# Patient Record
Sex: Male | Born: 1975 | Race: White | Hispanic: No | Marital: Married | Smoking: Current every day smoker
Health system: Southern US, Community
[De-identification: ages and names within clinical notes are randomized; demographics above are authoritative.]

## PROBLEM LIST (undated history)

## (undated) DIAGNOSIS — I10 Essential (primary) hypertension: Secondary | ICD-10-CM

## (undated) DIAGNOSIS — D751 Secondary polycythemia: Secondary | ICD-10-CM

## (undated) HISTORY — DX: Secondary polycythemia: D75.1

## (undated) SURGERY — Surgical Case
Anesthesia: *Unknown

---

## 2001-07-26 ENCOUNTER — Emergency Department (HOSPITAL_COMMUNITY): Admission: EM | Admit: 2001-07-26 | Discharge: 2001-07-26 | Payer: Self-pay | Admitting: Emergency Medicine

## 2001-07-26 ENCOUNTER — Encounter: Payer: Self-pay | Admitting: Emergency Medicine

## 2001-07-28 ENCOUNTER — Ambulatory Visit (HOSPITAL_BASED_OUTPATIENT_CLINIC_OR_DEPARTMENT_OTHER): Admission: RE | Admit: 2001-07-28 | Discharge: 2001-07-28 | Payer: Self-pay | Admitting: Orthopedic Surgery

## 2001-10-24 ENCOUNTER — Ambulatory Visit (HOSPITAL_BASED_OUTPATIENT_CLINIC_OR_DEPARTMENT_OTHER): Admission: RE | Admit: 2001-10-24 | Discharge: 2001-10-24 | Payer: Self-pay | Admitting: Orthopedic Surgery

## 2007-08-11 ENCOUNTER — Emergency Department (HOSPITAL_COMMUNITY): Admission: EM | Admit: 2007-08-11 | Discharge: 2007-08-11 | Payer: Self-pay | Admitting: Emergency Medicine

## 2010-10-09 NOTE — Op Note (Signed)
[place]. [facility]  Patient:    Corey Robinson, Corey Robinson Visit Number: [id number] MRN: [id number]          Service Type: DSU Location: [facility] Attending Physician:  Ronne Binning Dictated by:   Nicki Reaper, M.D. Proc. Date: 10/24/01 Admit Date:  10/24/2001   CC:         (2)   Operative Report  PREOPERATIVE DIAGNOSIS: Status post intramedullary rod of right middle finger for metacarpal fracture.  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  OPERATION/PROCEDURE: Removal of intramedullary rod and repair with transfer of partial rupture to range finger extensor tendon, right hand.  SURGEON: Nicki Reaper, M.D.  ASSISTANT: Joaquin Courts, R.N.  ANESTHESIA: Forearm-based IV regional.  HISTORY: The patient is a 35 year old male who suffered a fracture of his third metacarpal, right hand.  He underwent IM rod fixation.  He has had healing of the fracture but discomfort at the dorsal aspect of his hand, and is admitted now for removal of the IM rod.  PROCEDURE: The patient was brought to the operating room, where a forearm-based IV regional anesthetic was carried out without difficulty.  He was prepped and draped using Betadine scrubbing solution with the right arm free.  A longitudinal incision was made over the base of the third metacarpal and carried down through subcutaneous tissue.  Bleeders were electrocauterized.  The IM rod was immediately apparent.  This was removed without difficulty.  The fracture was stable.  Significant serosanguineous fluid was present within the extensor sheath and it was decided to explore the extensor tendon.  A slip of the middle finger extensor tendon was was intact, allowing extension of the middle finger; however, a second portion was found to be ruptured.  It was decided to repair this to the  ring finger to add significant substance and strength to the remaining tendon.  This was sutured into position with figure-of-eight 4-0 [place] sutures in that the proximal end was not Corey Robinson to be discovered.  This was done with the finger fully extended to both middle and ring.  The two fingers lie in equal position following the repair.  The wound was irrigated.  The skin was closed with interrupted 5-0 nylon sutures.  A sterile compressive dressing and splint were applied.  The patient tolerated the procedure well and was taken to the recovery room for observation in satisfactory condition.  The family and patient were notified of the repair to the tendon.  He will be discharged home to return to the [facility] [place] in one week, on Vicodin and Keflex.Dictated by:   Nicki Reaper, M.D. Attending Physician:  Ronne Binning DD:  10/24/01 TD:  10/25/01 Job: [id number]

## 2010-10-09 NOTE — Op Note (Signed)
[place]  Patient:    Corey Robinson, Corey Robinson Visit Number: [id number] MRN: [id number]          Service Type: DSU Location: [facility] Attending Physician:  Ronne Binning Dictated by:   Nicki Reaper, M.D. Proc. Date: 07/28/01 Admit Date:  07/28/2001                             Operative Report  PREOPERATIVE DIAGNOSIS:  Fractured third metacarpal, right hand.  POSTOPERATIVE DIAGNOSIS:  Fractured third metacarpal, right hand.  OPERATION:  Placement of Innovasive design IM rod, right middle finger metacarpal.  SURGEON:   Nicki Reaper, M.D.  ASSISTANT:  Joaquin Courts, R.N.  ANESTHESIA:  General.  DATE OF OPERATION:  July 28, 2001  HISTORY:  The patient is a 35 year old male who suffered a fracture of his third metacarpal, right hand.  This is a short oblique with complete displacement.  DESCRIPTION OF PROCEDURE:  The patient was brought to the operating room where a general anesthetic was carried out without difficulty.  He was prepped and draped using Betadine scrub and solution with the right arm free after a general anesthetic was given.  The position of the base of the third metacarpal was identified with a needle using the OEC.  An incision was then made.  An Innovasive IM rod was then inserted into the base of the third metacarpal after dissecting down to the metacarpal base to protect the extensor tendons.  The trocar was then used to introduce the IM rod.  This was then passed into the proximal fracture fragment.  With a moderate amount of difficulty, this was passed distally.  The canal was extremely tight, causing moderate difficulty in aligning the fracture fragment and passing the rod into the distal portion.  This was finally accomplished with the fracture in excellent position - AP, lateral, and oblique x-rays.  This was fully stable. The rod was then bent and cut beneath the skin, taking care to protect the extensor tendons.  The  wound was irrigated.  The skin was closed with interrupted 5-0 nylon sutures.  A sterile compressive dressing and splint were applied.  The patient tolerated the procedure well and was taken to the recovery room for observation in satisfactory condition.  DISPOSITION:  He is discharged home to return to [facility] [place] in one week on [place] NX and Keflex. Dictated by:   Nicki Reaper, M.D. Attending Physician:  Ronne Binning DD:  07/28/01 TD:  07/29/01 Job: [id number]

## 2011-05-25 ENCOUNTER — Ambulatory Visit (INDEPENDENT_AMBULATORY_CARE_PROVIDER_SITE_OTHER): Payer: BC Managed Care – PPO

## 2011-05-25 DIAGNOSIS — J019 Acute sinusitis, unspecified: Secondary | ICD-10-CM

## 2011-05-25 DIAGNOSIS — R059 Cough, unspecified: Secondary | ICD-10-CM

## 2011-05-25 DIAGNOSIS — R05 Cough: Secondary | ICD-10-CM

## 2012-06-15 ENCOUNTER — Telehealth: Payer: Self-pay | Admitting: Oncology

## 2012-06-15 NOTE — Telephone Encounter (Signed)
S/W pt in re NP appt 2/13 @ 10:30 w/Dr. Gaylyn Rong Referring Dr. Modesto Charon Dx-Polycythemia Welcome packet mailed.

## 2012-06-15 NOTE — Telephone Encounter (Signed)
C/D 06/16/12 for appt.07/06/12

## 2012-06-20 ENCOUNTER — Encounter: Payer: Self-pay | Admitting: Oncology

## 2012-06-20 ENCOUNTER — Other Ambulatory Visit: Payer: Self-pay | Admitting: Oncology

## 2012-06-20 DIAGNOSIS — D751 Secondary polycythemia: Secondary | ICD-10-CM

## 2012-07-06 ENCOUNTER — Ambulatory Visit: Payer: Self-pay | Admitting: Oncology

## 2012-07-06 ENCOUNTER — Ambulatory Visit: Payer: Self-pay

## 2012-07-06 ENCOUNTER — Other Ambulatory Visit: Payer: Self-pay | Admitting: Lab

## 2012-07-06 NOTE — Progress Notes (Signed)
No show due to weather.  Letter sent to remind patient to reschedule.

## 2013-05-11 ENCOUNTER — Telehealth: Payer: Self-pay | Admitting: Family Medicine

## 2013-05-11 NOTE — Telephone Encounter (Signed)
appt tomorrow at 8:00

## 2013-05-12 ENCOUNTER — Ambulatory Visit (INDEPENDENT_AMBULATORY_CARE_PROVIDER_SITE_OTHER): Payer: BC Managed Care – PPO | Admitting: General Practice

## 2013-05-12 VITALS — BP 121/79 | HR 79 | Temp 97.0°F | Wt 210.0 lb

## 2013-05-12 DIAGNOSIS — L255 Unspecified contact dermatitis due to plants, except food: Secondary | ICD-10-CM

## 2013-05-12 DIAGNOSIS — L237 Allergic contact dermatitis due to plants, except food: Secondary | ICD-10-CM

## 2013-05-12 MED ORDER — PREDNISONE (PAK) 10 MG PO TABS: ORAL_TABLET | ORAL | Status: DC

## 2013-05-12 MED ORDER — TRIAMCINOLONE ACETONIDE 0.025 % EX CREA: 1.0000 "application " | TOPICAL_CREAM | Freq: Two times a day (BID) | CUTANEOUS | Status: DC

## 2013-05-12 MED ORDER — METHYLPREDNISOLONE ACETATE 80 MG/ML IJ SUSP
80.0000 mg | Freq: Once | INTRAMUSCULAR | Status: AC
  Administered 2013-05-12: 80 mg via INTRAMUSCULAR

## 2013-05-12 NOTE — Patient Instructions (Signed)
Poison Ivy Poison ivy is a inflammation of the skin (contact dermatitis) caused by touching the allergens on the leaves of the ivy plant following previous exposure to the plant. The rash usually appears 48 hours after exposure. The rash is usually bumps (papules) or blisters (vesicles) in a linear pattern. Depending on your own sensitivity, the rash may simply cause redness and itching, or it may also progress to blisters which may break open. These must be well cared for to prevent secondary bacterial (germ) infection, followed by scarring. Keep any open areas dry, clean, dressed, and covered with an antibacterial ointment if needed. The eyes may also get puffy. The puffiness is worst in the morning and gets better as the day progresses. This dermatitis usually heals without scarring, within 2 to 3 weeks without treatment. HOME CARE INSTRUCTIONS  Thoroughly wash with soap and water as soon as you have been exposed to poison ivy. You have about one half hour to remove the plant resin before it will cause the rash. This washing will destroy the oil or antigen on the skin that is causing, or will cause, the rash. Be sure to wash under your fingernails as any plant resin there will continue to spread the rash. Do not rub skin vigorously when washing affected area. Poison ivy cannot spread if no oil from the plant remains on your body. A rash that has progressed to weeping sores will not spread the rash unless you have not washed thoroughly. It is also important to wash any clothes you have been wearing as these may carry active allergens. The rash will return if you wear the unwashed clothing, even several days later. Avoidance of the plant in the future is the best measure. Poison ivy plant can be recognized by the number of leaves. Generally, poison ivy has three leaves with flowering branches on a single stem. Diphenhydramine may be purchased over the counter and used as needed for itching. Do not drive with  this medication if it makes you drowsy.Ask your caregiver about medication for children. SEEK MEDICAL CARE IF:  Open sores develop.  Redness spreads beyond area of rash.  You notice purulent (pus-like) discharge.  You have increased pain.  Other signs of infection develop (such as fever). Document Released: 05/07/2000 Document Revised: 08/02/2011 Document Reviewed: 03/26/2009 ExitCare Patient Information 2014 [name].  

## 2013-05-12 NOTE — Progress Notes (Signed)
   Subjective:    Patient ID: Corey Robinson, male    DOB: 1975/06/24, 37 y.o.   MRN: [id number]  Poison [place] This is a new problem. The current episode started in the past 7 days. The problem has been gradually worsening since onset. The affected locations include the face, right arm and left arm. The rash is characterized by redness and itchiness. He was exposed to plant contact. Pertinent negatives include no congestion, cough, diarrhea, fatigue, joint pain, rhinorrhea, shortness of breath or sore throat. Past treatments include nothing. There is no history of allergies, asthma or eczema.      Review of Systems  Constitutional: Negative for fatigue.  HENT: Negative for congestion, rhinorrhea and sore throat.   Respiratory: Negative for cough, chest tightness and shortness of breath.   Cardiovascular: Negative for chest pain and palpitations.  Gastrointestinal: Negative for diarrhea.  Musculoskeletal: Negative for joint pain.  Skin: Positive for rash.       Red, itchy rash to face, arms, and left thigh       Objective:   Physical Exam  Constitutional: He is oriented to person, place, and time. He appears well-developed and well-nourished.  HENT:  Head: Normocephalic and atraumatic.  Right Ear: External ear normal.  Left Ear: External ear normal.  Mouth/Throat: Oropharynx is clear and moist.  Eyes: EOM are normal. Pupils are equal, round, and reactive to light.  Cardiovascular: Normal rate, regular rhythm and normal heart sounds.   Pulmonary/Chest: Effort normal and breath sounds normal. No respiratory distress. He exhibits no tenderness.  Neurological: He is alert and oriented to person, place, and time.  Skin: Skin is warm and dry. Rash noted.  Maculopapular, erythematous rash to left check and general forehead, arms, and left thigh. Negative drainage.   Psychiatric: He has a normal mood and affect.          Assessment & Plan:  1. Poison ivy dermatitis - predniSONE  (STERAPRED UNI-PAK) 10 MG tablet; Take as directed. Start on 05/13/13  Dispense: 21 tablet; Refill: 0 - triamcinolone (KENALOG) 0.025 % cream; Apply 1 application topically 2 (two) times daily.  Dispense: 30 g; Refill: 0 - methylPREDNISolone acetate (DEPO-MEDROL) injection 80 mg; Inject 1 mL (80 mg total) into the muscle once. -increase fluids -avoid irritants -RTO if symptoms worsen or may seek emergency medical treatment -Patient verbalized understanding Coralie Keens, FNP-C

## 2015-01-20 ENCOUNTER — Ambulatory Visit (INDEPENDENT_AMBULATORY_CARE_PROVIDER_SITE_OTHER): Payer: BLUE CROSS/BLUE SHIELD | Admitting: Family Medicine

## 2015-01-20 ENCOUNTER — Encounter: Payer: Self-pay | Admitting: Family Medicine

## 2015-01-20 VITALS — BP 143/97 | HR 111 | Temp 98.1°F | Ht 71.0 in | Wt 238.8 lb

## 2015-01-20 DIAGNOSIS — L237 Allergic contact dermatitis due to plants, except food: Secondary | ICD-10-CM

## 2015-01-20 DIAGNOSIS — L259 Unspecified contact dermatitis, unspecified cause: Secondary | ICD-10-CM

## 2015-01-20 MED ORDER — TRIAMCINOLONE ACETONIDE 40 MG/ML IJ SUSP
40.0000 mg | Freq: Once | INTRAMUSCULAR | Status: AC
  Administered 2015-01-20: 40 mg via INTRAMUSCULAR

## 2015-01-20 MED ORDER — PREDNISONE 20 MG PO TABS: ORAL_TABLET | ORAL | Status: DC

## 2015-01-20 NOTE — Patient Instructions (Signed)

## 2015-01-20 NOTE — Progress Notes (Signed)
   HPI  Patient presents today for eval of poison ivy  Patient explains he got into poison ivy 2 days ago when responding to a call, he is a Company secretary. He has rash and itchy red spots on his arms and face under his eyes  He denies fever, chills, sweats or concern ofr ay areas that are infected.   He has a normal appetite  He does not have any drainage of any of the areas, he has scratched some of teh spots on his arms causing some additional pain  PMH: Smoking status noted ROS: Per HPI  Objective: BP 143/97 mmHg  Pulse 111  Temp(Src) 98.1 F (36.7 C) (Oral)  Ht  (1.803 m)  Wt 238 lb 12.8 oz (108.319 kg)  BMI 33.32 kg/m2 Gen: NAD, alert, cooperative with exam HEENT: NCAT Ext: No edema, warm Neuro: Alert and oriented, No gross deficits  Skin: erythematous papules and crusting on BL Arms, some similar papules on Bl face beneath his eyes, no areas of fluctuance or drainage.   Assessment and plan:  # poison ivy, contact derm Given lesion on face will give 40 mg IM kenalog and 12 day prednisone burst Reasons to return provided.    Murtis Sink, MD [name] 01/20/2015, 5:07 PM

## 2016-09-01 ENCOUNTER — Encounter: Payer: Self-pay | Admitting: Family Medicine

## 2016-09-01 ENCOUNTER — Ambulatory Visit (INDEPENDENT_AMBULATORY_CARE_PROVIDER_SITE_OTHER): Payer: BLUE CROSS/BLUE SHIELD | Admitting: Family Medicine

## 2016-09-01 VITALS — BP 156/99 | HR 88 | Temp 97.6°F | Ht 71.0 in | Wt 245.0 lb

## 2016-09-01 DIAGNOSIS — F17200 Nicotine dependence, unspecified, uncomplicated: Secondary | ICD-10-CM | POA: Diagnosis not present

## 2016-09-01 DIAGNOSIS — R739 Hyperglycemia, unspecified: Secondary | ICD-10-CM | POA: Diagnosis not present

## 2016-09-01 LAB — BAYER DCA HB A1C WAIVED: HB A1C (BAYER DCA - WAIVED): 5.5 % (ref <7.0)

## 2016-09-01 MED ORDER — VARENICLINE TARTRATE 0.5 MG X 11 & 1 MG X 42 PO MISC: ORAL | 0 refills | Status: DC

## 2016-09-01 MED ORDER — VARENICLINE TARTRATE 1 MG PO TABS: 1.0000 mg | ORAL_TABLET | Freq: Two times a day (BID) | ORAL | 5 refills | Status: DC

## 2016-09-01 NOTE — Progress Notes (Signed)
Subjective:  Patient ID: Corey Robinson, male    DOB: 14-Aug-1975  Age: 41 y.o. 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 He denies sx of polyuria, polydipsia, polyphagia. Would like to stop smoking. He does not have a history of high blood pressure. Does not take medicine for that. Thinks that's just due to his stress today. He feels his blood pressure will come down once he quit smoking as well.  History Corey Robinson has a past medical history of Polycythemia.   He has no past surgical history on file.   His family history is not on file.He reports that he has been smoking.  He uses smokeless tobacco. His alcohol and drug histories are not on file.    ROS Review of Systems  Constitutional: Negative for chills, diaphoresis and fever.  HENT: Negative for rhinorrhea and sore throat.   Respiratory: Negative for cough and shortness of breath.   Cardiovascular: Negative for chest pain.  Gastrointestinal: Negative for abdominal pain.  Endocrine: Negative for polydipsia, polyphagia and polyuria.  Musculoskeletal: Negative for arthralgias and myalgias.  Skin: Negative for rash.    Objective:  BP (!) 156/99   Pulse 88   Temp 97.6 F (36.4 C) (Oral)   Ht  (1.803 m)   Wt 245 lb (111.1 kg)   BMI 34.17 kg/m   BP Readings from Last 3 Encounters:  09/01/16 (!) 156/99  01/20/15 (!) 143/97  05/12/13 121/79    Wt Readings from Last 3 Encounters:  09/01/16 245 lb (111.1 kg)  01/20/15 238 lb 12.8 oz (108.3 kg)  05/12/13 210 lb (95.3 kg)     Physical Exam  Constitutional: He appears well-developed and well-nourished.  HENT:  Head: Normocephalic and atraumatic.  Right Ear: Tympanic membrane and external ear normal. No decreased hearing is noted.  Left Ear: Tympanic membrane and external ear normal. No decreased hearing  is noted.  Mouth/Throat: No oropharyngeal exudate or posterior oropharyngeal erythema.  Eyes: Pupils are equal, round, and reactive to light.  Neck: Normal range of motion. Neck supple.  Cardiovascular: Normal rate and regular rhythm.   No murmur heard. Pulmonary/Chest: Breath sounds normal. No respiratory distress.  Abdominal: Soft. Bowel sounds are normal. He exhibits no mass. There is no tenderness.  Vitals reviewed.     Assessment & Plan:   Corey Robinson was seen today for hyperglycemia.  Diagnoses and all orders for this visit:  Elevated blood sugar -     Bayer DCA Hb A1c Waived  Tobacco dependence  Other orders -     varenicline (CHANTIX STARTING MONTH PAK) 0.5 MG X 11 & 1 MG X 42 tablet; Increase dose as directed on label -     varenicline (CHANTIX CONTINUING MONTH PAK) 1 MG tablet; Take 1 tablet (1 mg total) by mouth 2 (two) times daily.       I have discontinued Corey Robinson predniSONE. I am also having him start on varenicline and varenicline.  Allergies as of 09/01/2016   No Known Allergies     Medication List       Accurate as of 09/01/16 11:59 PM. Always use your most recent med list.          varenicline 0.5 MG X 11 & 1 MG X 42 tablet Commonly known as:  CHANTIX STARTING MONTH PAK Increase  dose as directed on label   varenicline 1 MG tablet Commonly known as:  CHANTIX CONTINUING MONTH PAK Take 1 tablet (1 mg total) by mouth 2 (two) times daily.      Monitor blood pressure. Goal is 135/85 or below. Consider follow-up for treatment if private monitoring is not satisfactory. Hemoglobin A1c is in normal range. His best bet to prevent both high blood pressure and diabetes is to lose weight. He is can work on that once he gets off cigarettes as well. Follow-up: Return if symptoms worsen or fail to improve.  Mechele Claude, M.D.

## 2017-01-04 ENCOUNTER — Ambulatory Visit (INDEPENDENT_AMBULATORY_CARE_PROVIDER_SITE_OTHER): Payer: BLUE CROSS/BLUE SHIELD | Admitting: Family Medicine

## 2017-01-04 ENCOUNTER — Encounter: Payer: Self-pay | Admitting: Family Medicine

## 2017-01-04 ENCOUNTER — Ambulatory Visit (INDEPENDENT_AMBULATORY_CARE_PROVIDER_SITE_OTHER): Payer: BLUE CROSS/BLUE SHIELD

## 2017-01-04 VITALS — BP 127/87 | HR 69 | Temp 98.0°F | Ht 71.0 in | Wt 238.0 lb

## 2017-01-04 DIAGNOSIS — R7303 Prediabetes: Secondary | ICD-10-CM | POA: Insufficient documentation

## 2017-01-04 DIAGNOSIS — R0600 Dyspnea, unspecified: Secondary | ICD-10-CM | POA: Insufficient documentation

## 2017-01-04 DIAGNOSIS — D751 Secondary polycythemia: Secondary | ICD-10-CM

## 2017-01-04 DIAGNOSIS — E119 Type 2 diabetes mellitus without complications: Secondary | ICD-10-CM | POA: Diagnosis not present

## 2017-01-04 HISTORY — DX: Prediabetes: R73.03

## 2017-01-04 LAB — BAYER DCA HB A1C WAIVED: HB A1C: 5.7 % (ref <7.0)

## 2017-01-04 NOTE — Progress Notes (Signed)
Subjective:  Patient ID: Corey Robinson, male    DOB: [birth year]  Age: 41 y.o. MRN: [id number]  CC: Shortness of Breath (pt here today c/o SOB with exertion at work and this heat makes it worse. He wants to make sure it isn't anything wrong with him besides "being out of shape".)   HPI Corey Robinson presents for Patient had a physical agility test recently during which she developed dyspnea and chest tightness. He was wearing a supplemental oxygen mask. He generally can walk as far as he wants without shortness of breath. The physical agility test done without oxygen went well for him. Of note is patient has been smoking one to one and a half packs per day for the last 10 years and 14 years prior to that was smoking 1/2-1 pack per day. Giving 20-30-pack-year smoking history. He is currently cutting down while using Chantix. He denies any dyspnea at rest. No chest pain at rest. The chest tightness went away after about a minute of rest. It did not radiate. Patient is concerned that this may represent deconditioning versus smoking damage to his heart, lungs,  etc.  Depression screen [name] 2/9 01/04/2017 09/01/2016 01/20/2015  Decreased Interest 0 0 0  Down, Depressed, Hopeless 0 0 0  PHQ - 2 Score 0 0 0    History Corey Robinson has a past medical history of Polycythemia.   He has no past surgical history on file.   His family history is not on file.He reports that he has been smoking.  He uses smokeless tobacco. His alcohol and drug histories are not on file.    ROS Review of Systems  Constitutional: Negative for chills, diaphoresis, fever and unexpected weight change.  HENT: Negative for congestion, hearing loss, rhinorrhea and sore throat.   Eyes: Negative for visual disturbance.  Respiratory: Positive for chest tightness and shortness of breath. Negative for cough.   Cardiovascular: Positive for chest pain. Negative for palpitations.  Gastrointestinal: Negative for abdominal pain,  constipation and diarrhea.  Genitourinary: Negative for dysuria and flank pain.  Musculoskeletal: Negative for arthralgias and joint swelling.  Skin: Negative for rash.  Neurological: Negative for dizziness and headaches.  Psychiatric/Behavioral: Negative for dysphoric mood and sleep disturbance.    Objective:  BP 127/87   Pulse 69   Temp 98 F (36.7 C) (Oral)   Ht _0  (1.803 m)   Wt 238 lb (108 kg)   BMI 33.19 kg/m   BP Readings from Last 3 Encounters:  01/04/17 127/87  09/01/16 (!) 156/99  01/20/15 (!) 143/97    Wt Readings from Last 3 Encounters:  01/04/17 238 lb (108 kg)  09/01/16 245 lb (111.1 kg)  01/20/15 238 lb 12.8 oz (108.3 kg)     Physical Exam  Constitutional: He is oriented to person, place, and time. He appears well-developed and well-nourished. No distress.  HENT:  Head: Normocephalic and atraumatic.  Right Ear: External ear normal.  Left Ear: External ear normal.  Nose: Nose normal.  Mouth/Throat: Oropharynx is clear and moist.  Eyes: Pupils are equal, round, and reactive to light. Conjunctivae and EOM are normal.  Neck: Normal range of motion. Neck supple. No thyromegaly present.  Cardiovascular: Normal rate, regular rhythm and normal heart sounds.   No murmur heard. Pulmonary/Chest: Effort normal and breath sounds normal. No respiratory distress. He has no wheezes. He has no rales.  Abdominal: Soft. Bowel sounds are normal. He exhibits no distension. There is no tenderness.  Lymphadenopathy:  He has no cervical adenopathy.  Neurological: He is alert and oriented to person, place, and time. He has normal reflexes.  Skin: Skin is warm and dry.  Psychiatric: He has a normal mood and affect. His behavior is normal. Judgment and thought content normal.      Assessment & Plan:   Can was seen today for shortness of breath.  Diagnoses and all orders for this visit:  Dyspnea, unspecified type -     DG Chest 2 View; Future -     EKG  12-Lead -     PR BREATHING CAPACITY TEST -     CBC with Differential/Platelet -     CMP14+EGFR -     D-dimer, quantitative (not at [facility])  Polycythemia -     CBC with Differential/Platelet -     CMP14+EGFR -     D-dimer, quantitative (not at [facility])  Diabetes mellitus without complication ([place]) -     Bayer DCA Hb A1c Waived -     CBC with Differential/Platelet -     CMP14+EGFR -     D-dimer, quantitative (not at [name])    EKG today showed minimal change in V1 nondiagnostic. The PFT showed minimal decrease in his FEV1 again nondiagnostic. Chest x-ray shows no acute disease.   I am having Corey Robinson maintain his varenicline. Smoking cessation highly encouraged.  Allergies as of 01/04/2017   No Known Allergies     Medication List       Accurate as of 01/04/17  7:27 PM. Always use your most recent med list.          varenicline 1 MG tablet Commonly known as:  CHANTIX CONTINUING MONTH PAK Take 1 tablet (1 mg total) by mouth 2 (two) times daily.        Follow-up: Return in about 6 weeks (around 02/15/2017).  Claretta Fraise, M.D.

## 2017-01-05 LAB — CBC WITH DIFFERENTIAL/PLATELET
BASOS: 1 %
Basophils Absolute: 0 10*3/uL (ref 0.0–0.2)
EOS (ABSOLUTE): 0.2 10*3/uL (ref 0.0–0.4)
EOS: 2 %
HEMATOCRIT: 47 % (ref 37.5–51.0)
Hemoglobin: 16.9 g/dL (ref 13.0–17.7)
IMMATURE GRANS (ABS): 0 10*3/uL (ref 0.0–0.1)
Immature Granulocytes: 0 %
LYMPHS: 40 %
Lymphocytes Absolute: 3 10*3/uL (ref 0.7–3.1)
MCH: 30.6 pg (ref 26.6–33.0)
MCHC: 36 g/dL — ABNORMAL HIGH (ref 31.5–35.7)
MCV: 85 fL (ref 79–97)
MONOCYTES: 6 %
MONOS ABS: 0.5 10*3/uL (ref 0.1–0.9)
NEUTROS PCT: 51 %
Neutrophils Absolute: 3.9 10*3/uL (ref 1.4–7.0)
Platelets: 204 10*3/uL (ref 150–379)
RBC: 5.53 x10E6/uL (ref 4.14–5.80)
RDW: 14.6 % (ref 12.3–15.4)
WBC: 7.6 10*3/uL (ref 3.4–10.8)

## 2017-01-05 LAB — CMP14+EGFR
ALT: 39 IU/L (ref 0–44)
AST: 31 IU/L (ref 0–40)
Albumin/Globulin Ratio: 1.8 (ref 1.2–2.2)
Albumin: 4.5 g/dL (ref 3.5–5.5)
Alkaline Phosphatase: 100 IU/L (ref 39–117)
BUN/Creatinine Ratio: 14 (ref 9–20)
BUN: 14 mg/dL (ref 6–24)
Bilirubin Total: 0.4 mg/dL (ref 0.0–1.2)
CALCIUM: 8.9 mg/dL (ref 8.7–10.2)
CO2: 18 mmol/L — AB (ref 20–29)
CREATININE: 0.98 mg/dL (ref 0.76–1.27)
Chloride: 102 mmol/L (ref 96–106)
GFR, EST AFRICAN AMERICAN: 111 mL/min/{1.73_m2} (ref >59)
GFR, EST NON AFRICAN AMERICAN: 96 mL/min/{1.73_m2} (ref >59)
GLOBULIN, TOTAL: 2.5 g/dL (ref 1.5–4.5)
Glucose: 86 mg/dL (ref 65–99)
Potassium: 4.1 mmol/L (ref 3.5–5.2)
Sodium: 135 mmol/L (ref 134–144)
TOTAL PROTEIN: 7 g/dL (ref 6.0–8.5)

## 2017-01-05 LAB — D-DIMER, QUANTITATIVE: D-DIMER: <0.2 mg/L FEU (ref 0.00–0.49)

## 2017-01-07 ENCOUNTER — Ambulatory Visit: Payer: BLUE CROSS/BLUE SHIELD | Admitting: Family Medicine

## 2017-02-16 ENCOUNTER — Encounter: Payer: Self-pay | Admitting: Family Medicine

## 2017-02-16 ENCOUNTER — Ambulatory Visit (INDEPENDENT_AMBULATORY_CARE_PROVIDER_SITE_OTHER): Payer: BLUE CROSS/BLUE SHIELD | Admitting: Family Medicine

## 2017-02-16 VITALS — BP 127/87 | HR 83 | Temp 97.1°F | Ht 71.0 in | Wt 242.0 lb

## 2017-02-16 DIAGNOSIS — E669 Obesity, unspecified: Secondary | ICD-10-CM | POA: Diagnosis not present

## 2017-02-16 DIAGNOSIS — F17200 Nicotine dependence, unspecified, uncomplicated: Secondary | ICD-10-CM | POA: Diagnosis not present

## 2017-02-16 DIAGNOSIS — R0602 Shortness of breath: Secondary | ICD-10-CM | POA: Diagnosis not present

## 2017-02-16 MED ORDER — ALBUTEROL SULFATE HFA 108 (90 BASE) MCG/ACT IN AERS: 2.0000 | INHALATION_SPRAY | RESPIRATORY_TRACT | 5 refills | Status: DC | PRN

## 2017-02-16 MED ORDER — VARENICLINE TARTRATE 1 MG PO TABS: 1.0000 mg | ORAL_TABLET | Freq: Two times a day (BID) | ORAL | 2 refills | Status: DC

## 2017-02-16 NOTE — Progress Notes (Signed)
Subjective:  Patient ID: Corey Robinson, male    DOB: 1976-03-16  Age: 41 y.o. MRN: [id number]  CC: Follow-up (pt here today following up on SOB and also wants refills on Chantix)   HPI Corey Robinson presents for Continued dyspnea. There is no change overall. He does not have symptoms intact except when he has to wear his respirator at work. This continued use from his previous visit. It is a recent problem for him. Historically he's been able to wear a ventilator mask without shortness of breath. He has cut back on smoking. Now down to half pack daily.  Depression screen [employer] [name] 2/9 02/16/2017 01/04/2017 09/01/2016  Decreased Interest 0 0 0  Down, Depressed, Hopeless 0 0 0  PHQ - 2 Score 0 0 0    History Corey Robinson has a past medical history of Polycythemia.   He has no past surgical history on file.   His family history is not on file.He reports that he has been smoking.  He uses smokeless tobacco. His alcohol and drug histories are not on file.    ROS Review of Systems  Constitutional: Negative for chills, diaphoresis and fever.  HENT: Negative for rhinorrhea and sore throat.   Respiratory: Positive for shortness of breath (only when he has to wear his respirator mask at work). Negative for cough.   Cardiovascular: Negative for chest pain.  Gastrointestinal: Negative for abdominal pain.  Musculoskeletal: Negative for arthralgias and myalgias.  Skin: Negative for rash.  Neurological: Negative for weakness and headaches.    Objective:  BP 127/87   Pulse 83   Temp (!) 97.1 F (36.2 C) (Oral)   Ht  (1.803 m)   Wt 242 lb (109.8 kg)   BMI 33.75 kg/m   BP Readings from Last 3 Encounters:  02/16/17 127/87  01/04/17 127/87  09/01/16 (!) 156/99    Wt Readings from Last 3 Encounters:  02/16/17 242 lb (109.8 kg)  01/04/17 238 lb (108 kg)  09/01/16 245 lb (111.1 kg)     Physical Exam  Constitutional: He appears well-developed and well-nourished.  HENT:  Head:  Normocephalic and atraumatic.  Right Ear: Tympanic membrane and external ear normal. No decreased hearing is noted.  Left Ear: Tympanic membrane and external ear normal. No decreased hearing is noted.  Mouth/Throat: No oropharyngeal exudate or posterior oropharyngeal erythema.  Eyes: Pupils are equal, round, and reactive to light.  Neck: Normal range of motion. Neck supple.  Cardiovascular: Normal rate and regular rhythm.   No murmur heard. Pulmonary/Chest: Breath sounds normal. No respiratory distress.  Abdominal: Soft. There is no tenderness.  Vitals reviewed.     Assessment & Plan:   Corey Robinson was seen today for follow-up.  Diagnoses and all orders for this visit:  Shortness of breath  Smoker  Obesity (BMI 30.0-34.9)  Other orders -     varenicline (CHANTIX CONTINUING MONTH PAK) 1 MG tablet; Take 1 tablet (1 mg total) by mouth 2 (two) times daily. -     albuterol (PROVENTIL HFA;VENTOLIN HFA) 108 (90 Base) MCG/ACT inhaler; Inhale 2 puffs into the lungs every 4 (four) hours as needed for wheezing or shortness of breath.       I am having Corey Robinson start on albuterol. I am also having him maintain his varenicline.  Allergies as of 02/16/2017   No Known Allergies     Medication List       Accurate as of 02/16/17  6:41 PM. Always use your most  recent med list.          albuterol 108 (90 Base) MCG/ACT inhaler Commonly known as:  PROVENTIL HFA;VENTOLIN HFA Inhale 2 puffs into the lungs every 4 (four) hours as needed for wheezing or shortness of breath.   varenicline 1 MG tablet Commonly known as:  CHANTIX CONTINUING MONTH PAK Take 1 tablet (1 mg total) by mouth 2 (two) times daily.            Discharge Care Instructions        Start     Ordered   02/16/17 0000  varenicline (CHANTIX CONTINUING MONTH PAK) 1 MG tablet  2 times daily    Comments:  Fill after pt. Finishes starter please   02/16/17 0929   02/16/17 0000  albuterol (PROVENTIL HFA;VENTOLIN HFA)  108 (90 Base) MCG/ACT inhaler  Every 4 hours PRN     02/16/17 0929    We discussed various interventions with regard to smoking cessation. He has cut back on the Chantix but not completely quit. I recommended rapid tapering. Additionally he is overweight and that may be affecting his respiratory ability as well. We discussed weight loss ideal weight for him would be right at 200 pounds. 2530 pounds weight loss recommended.    Follow-up: No Follow-up on file.  Mechele Claude, M.D.

## 2017-05-19 ENCOUNTER — Ambulatory Visit: Payer: BLUE CROSS/BLUE SHIELD | Admitting: Family Medicine

## 2017-05-27 ENCOUNTER — Ambulatory Visit: Payer: BLUE CROSS/BLUE SHIELD | Admitting: Family Medicine

## 2017-06-01 ENCOUNTER — Encounter: Payer: Self-pay | Admitting: Family Medicine

## 2017-12-05 ENCOUNTER — Ambulatory Visit: Payer: BLUE CROSS/BLUE SHIELD | Admitting: Family Medicine

## 2017-12-06 DIAGNOSIS — M25511 Pain in right shoulder: Secondary | ICD-10-CM | POA: Diagnosis not present

## 2017-12-06 DIAGNOSIS — M7541 Impingement syndrome of right shoulder: Secondary | ICD-10-CM | POA: Diagnosis not present

## 2019-04-25 DIAGNOSIS — M25511 Pain in right shoulder: Secondary | ICD-10-CM | POA: Diagnosis not present

## 2019-04-25 DIAGNOSIS — M7541 Impingement syndrome of right shoulder: Secondary | ICD-10-CM | POA: Diagnosis not present

## 2019-10-18 DIAGNOSIS — J019 Acute sinusitis, unspecified: Secondary | ICD-10-CM | POA: Diagnosis not present

## 2020-01-07 LAB — HM DIABETES EYE EXAM

## 2020-12-09 DIAGNOSIS — H10013 Acute follicular conjunctivitis, bilateral: Secondary | ICD-10-CM | POA: Diagnosis not present

## 2021-03-04 DIAGNOSIS — M5441 Lumbago with sciatica, right side: Secondary | ICD-10-CM | POA: Diagnosis not present

## 2021-03-04 DIAGNOSIS — S39012A Strain of muscle, fascia and tendon of lower back, initial encounter: Secondary | ICD-10-CM | POA: Diagnosis not present

## 2021-04-01 ENCOUNTER — Encounter (HOSPITAL_COMMUNITY): Payer: Self-pay | Admitting: *Deleted

## 2021-04-01 ENCOUNTER — Encounter (HOSPITAL_COMMUNITY)
Admission: EM | Disposition: A | Discharge status: Home / Self Care | Payer: Self-pay | Source: Home / Self Care | Attending: Cardiovascular Disease

## 2021-04-01 ENCOUNTER — Inpatient Hospital Stay (HOSPITAL_COMMUNITY)
Admission: EM | Admit: 2021-04-01 | Discharge: 2021-04-04 | DRG: 247 | Disposition: A | Discharge status: Home / Self Care | Payer: BLUE CROSS/BLUE SHIELD | Attending: Cardiovascular Disease | Admitting: Cardiovascular Disease

## 2021-04-01 ENCOUNTER — Other Ambulatory Visit: Payer: Self-pay

## 2021-04-01 ENCOUNTER — Emergency Department (HOSPITAL_COMMUNITY): Payer: BLUE CROSS/BLUE SHIELD

## 2021-04-01 ENCOUNTER — Inpatient Hospital Stay (HOSPITAL_COMMUNITY): Payer: BLUE CROSS/BLUE SHIELD

## 2021-04-01 DIAGNOSIS — Z720 Tobacco use: Secondary | ICD-10-CM | POA: Diagnosis not present

## 2021-04-01 DIAGNOSIS — F17210 Nicotine dependence, cigarettes, uncomplicated: Secondary | ICD-10-CM | POA: Diagnosis not present

## 2021-04-01 DIAGNOSIS — E785 Hyperlipidemia, unspecified: Secondary | ICD-10-CM

## 2021-04-01 DIAGNOSIS — Z79899 Other long term (current) drug therapy: Secondary | ICD-10-CM | POA: Diagnosis not present

## 2021-04-01 DIAGNOSIS — I213 ST elevation (STEMI) myocardial infarction of unspecified site: Secondary | ICD-10-CM

## 2021-04-01 DIAGNOSIS — R079 Chest pain, unspecified: Secondary | ICD-10-CM | POA: Diagnosis not present

## 2021-04-01 DIAGNOSIS — I2121 ST elevation (STEMI) myocardial infarction involving left circumflex coronary artery: Secondary | ICD-10-CM

## 2021-04-01 DIAGNOSIS — E782 Mixed hyperlipidemia: Secondary | ICD-10-CM | POA: Diagnosis not present

## 2021-04-01 DIAGNOSIS — I2510 Atherosclerotic heart disease of native coronary artery without angina pectoris: Secondary | ICD-10-CM

## 2021-04-01 DIAGNOSIS — R42 Dizziness and giddiness: Secondary | ICD-10-CM | POA: Diagnosis not present

## 2021-04-01 DIAGNOSIS — R7303 Prediabetes: Secondary | ICD-10-CM | POA: Diagnosis not present

## 2021-04-01 DIAGNOSIS — I252 Old myocardial infarction: Secondary | ICD-10-CM | POA: Diagnosis not present

## 2021-04-01 DIAGNOSIS — D751 Secondary polycythemia: Secondary | ICD-10-CM | POA: Diagnosis present

## 2021-04-01 DIAGNOSIS — Z20822 Contact with and (suspected) exposure to covid-19: Secondary | ICD-10-CM | POA: Diagnosis present

## 2021-04-01 DIAGNOSIS — I2119 ST elevation (STEMI) myocardial infarction involving other coronary artery of inferior wall: Secondary | ICD-10-CM | POA: Diagnosis not present

## 2021-04-01 DIAGNOSIS — Z955 Presence of coronary angioplasty implant and graft: Secondary | ICD-10-CM

## 2021-04-01 DIAGNOSIS — Z716 Tobacco abuse counseling: Secondary | ICD-10-CM | POA: Diagnosis not present

## 2021-04-01 DIAGNOSIS — I10 Essential (primary) hypertension: Secondary | ICD-10-CM | POA: Diagnosis present

## 2021-04-01 DIAGNOSIS — I2109 ST elevation (STEMI) myocardial infarction involving other coronary artery of anterior wall: Secondary | ICD-10-CM | POA: Diagnosis not present

## 2021-04-01 DIAGNOSIS — J101 Influenza due to other identified influenza virus with other respiratory manifestations: Secondary | ICD-10-CM

## 2021-04-01 DIAGNOSIS — E119 Type 2 diabetes mellitus without complications: Secondary | ICD-10-CM

## 2021-04-01 HISTORY — DX: Essential (primary) hypertension: I10

## 2021-04-01 HISTORY — PX: CORONARY STENT INTERVENTION: CATH118234

## 2021-04-01 HISTORY — PX: LEFT HEART CATH AND CORONARY ANGIOGRAPHY: CATH118249

## 2021-04-01 LAB — CBC
HCT: 52.6 % — ABNORMAL HIGH (ref 39.0–52.0)
Hemoglobin: 17.6 g/dL — ABNORMAL HIGH (ref 13.0–17.0)
MCH: 30 pg (ref 26.0–34.0)
MCHC: 33.5 g/dL (ref 30.0–36.0)
MCV: 89.8 fL (ref 80.0–100.0)
Platelets: 182 10*3/uL (ref 150–400)
RBC: 5.86 MIL/uL — ABNORMAL HIGH (ref 4.22–5.81)
RDW: 12.9 % (ref 11.5–15.5)
WBC: 7.2 10*3/uL (ref 4.0–10.5)
nRBC: 0 % (ref 0.0–0.2)

## 2021-04-01 LAB — ECHOCARDIOGRAM COMPLETE
AR max vel: 3.44 cm2
AV Area VTI: 3.43 cm2
AV Area mean vel: 3.17 cm2
AV Mean grad: 2 mmHg
AV Peak grad: 4.2 mmHg
Ao pk vel: 1.02 m/s
Area-P 1/2: 3.68 cm2
S' Lateral: 2.9 cm
Weight: 3760 oz

## 2021-04-01 LAB — TROPONIN I (HIGH SENSITIVITY)
Troponin I (High Sensitivity): 66 ng/L — ABNORMAL HIGH (ref <18)
Troponin I (High Sensitivity): 88 ng/L — ABNORMAL HIGH (ref <18)
Troponin I (High Sensitivity): 903 ng/L (ref <18)
Troponin I (High Sensitivity): 1205 ng/L (ref <18)

## 2021-04-01 LAB — LIPID PANEL
Cholesterol: 235 mg/dL — ABNORMAL HIGH (ref 0–200)
HDL: 36 mg/dL — ABNORMAL LOW (ref >40)
LDL Cholesterol: 163 mg/dL — ABNORMAL HIGH (ref 0–99)
Total CHOL/HDL Ratio: 6.5 RATIO
Triglycerides: 181 mg/dL — ABNORMAL HIGH (ref <150)
VLDL: 36 mg/dL (ref 0–40)

## 2021-04-01 LAB — PROTIME-INR
INR: 0.9 (ref 0.8–1.2)
Prothrombin Time: 12.4 seconds (ref 11.4–15.2)

## 2021-04-01 LAB — APTT: aPTT: 27 seconds (ref 24–36)

## 2021-04-01 LAB — BASIC METABOLIC PANEL
Anion gap: 12 (ref 5–15)
BUN: 13 mg/dL (ref 6–20)
CO2: 21 mmol/L — ABNORMAL LOW (ref 22–32)
Calcium: 9 mg/dL (ref 8.9–10.3)
Chloride: 101 mmol/L (ref 98–111)
Creatinine, Ser: 0.95 mg/dL (ref 0.61–1.24)
GFR, Estimated: >60 mL/min (ref >60)
Glucose, Bld: 109 mg/dL — ABNORMAL HIGH (ref 70–99)
Potassium: 3.9 mmol/L (ref 3.5–5.1)
Sodium: 134 mmol/L — ABNORMAL LOW (ref 135–145)

## 2021-04-01 LAB — MRSA NEXT GEN BY PCR, NASAL: MRSA by PCR Next Gen: NOT DETECTED

## 2021-04-01 LAB — D-DIMER, QUANTITATIVE: D-Dimer, Quant: <0.27 ug/mL-FEU (ref 0.00–0.50)

## 2021-04-01 LAB — RESP PANEL BY RT-PCR (FLU A&B, COVID) ARPGX2
Influenza A by PCR: POSITIVE — AB
Influenza B by PCR: NEGATIVE
SARS Coronavirus 2 by RT PCR: NEGATIVE

## 2021-04-01 LAB — GLUCOSE, CAPILLARY: Glucose-Capillary: 109 mg/dL — ABNORMAL HIGH (ref 70–99)

## 2021-04-01 LAB — POCT ACTIVATED CLOTTING TIME
Activated Clotting Time: 399 seconds
Activated Clotting Time: 451 seconds

## 2021-04-01 LAB — HEMOGLOBIN A1C
Hgb A1c MFr Bld: 6.1 % — ABNORMAL HIGH (ref 4.8–5.6)
Mean Plasma Glucose: 128.37 mg/dL

## 2021-04-01 SURGERY — LEFT HEART CATH AND CORONARY ANGIOGRAPHY
Anesthesia: LOCAL

## 2021-04-01 MED ORDER — ASPIRIN 81 MG PO CHEW
CHEWABLE_TABLET | ORAL | Status: AC
  Administered 2021-04-01: 324 mg via ORAL
  Filled 2021-04-01: qty 4

## 2021-04-01 MED ORDER — LABETALOL HCL 5 MG/ML IV SOLN
10.0000 mg | INTRAVENOUS | Status: AC | PRN
  Administered 2021-04-01: 10 mg via INTRAVENOUS
  Filled 2021-04-01: qty 4

## 2021-04-01 MED ORDER — TIROFIBAN HCL IN NACL 5-0.9 MG/100ML-% IV SOLN
INTRAVENOUS | Status: AC | PRN
  Administered 2021-04-01: .15 ug/kg/min via INTRAVENOUS

## 2021-04-01 MED ORDER — NITROGLYCERIN IN D5W 200-5 MCG/ML-% IV SOLN
0.0000 ug/min | INTRAVENOUS | Status: DC
Start: 2021-04-01 — End: 2021-04-01
  Administered 2021-04-01: 10 ug/min via INTRAVENOUS
  Filled 2021-04-01: qty 250

## 2021-04-01 MED ORDER — METOPROLOL TARTRATE 25 MG PO TABS
25.0000 mg | ORAL_TABLET | Freq: Two times a day (BID) | ORAL | Status: DC
  Administered 2021-04-01 – 2021-04-02 (×2): 25 mg via ORAL
  Filled 2021-04-01 (×4): qty 1

## 2021-04-01 MED ORDER — SODIUM CHLORIDE 0.9% FLUSH
3.0000 mL | Freq: Two times a day (BID) | INTRAVENOUS | Status: DC
  Administered 2021-04-01 – 2021-04-04 (×5): 3 mL via INTRAVENOUS

## 2021-04-01 MED ORDER — MIDAZOLAM HCL 2 MG/2ML IJ SOLN
INTRAMUSCULAR | Status: AC
  Filled 2021-04-01: qty 2

## 2021-04-01 MED ORDER — TIROFIBAN HCL IN NACL 5-0.9 MG/100ML-% IV SOLN: 0.1500 ug/kg/min | INTRAVENOUS | Status: AC

## 2021-04-01 MED ORDER — ONDANSETRON HCL 4 MG/2ML IJ SOLN: 4.0000 mg | Freq: Four times a day (QID) | INTRAMUSCULAR | Status: DC | PRN

## 2021-04-01 MED ORDER — ACETAMINOPHEN 325 MG PO TABS
650.0000 mg | ORAL_TABLET | ORAL | Status: DC | PRN
  Filled 2021-04-01: qty 2

## 2021-04-01 MED ORDER — MORPHINE SULFATE (PF) 4 MG/ML IV SOLN
4.0000 mg | Freq: Once | INTRAVENOUS | Status: AC
  Administered 2021-04-01: 4 mg via INTRAVENOUS
  Filled 2021-04-01: qty 1

## 2021-04-01 MED ORDER — ASPIRIN 81 MG PO CHEW: 324.0000 mg | CHEWABLE_TABLET | Freq: Once | ORAL | Status: AC

## 2021-04-01 MED ORDER — TIROFIBAN (AGGRASTAT) BOLUS VIA INFUSION
INTRAVENOUS | Status: DC | PRN
  Administered 2021-04-01: 2665 ug via INTRAVENOUS

## 2021-04-01 MED ORDER — NITROGLYCERIN 0.4 MG SL SUBL
0.4000 mg | SUBLINGUAL_TABLET | SUBLINGUAL | Status: DC | PRN
  Administered 2021-04-01 (×3): 0.4 mg via SUBLINGUAL
  Filled 2021-04-01: qty 1

## 2021-04-01 MED ORDER — NITROGLYCERIN 1 MG/10 ML FOR IR/CATH LAB
INTRA_ARTERIAL | Status: DC | PRN
  Administered 2021-04-01: 200 ug via INTRACORONARY

## 2021-04-01 MED ORDER — VERAPAMIL HCL 2.5 MG/ML IV SOLN
INTRAVENOUS | Status: AC
  Filled 2021-04-01: qty 2

## 2021-04-01 MED ORDER — SODIUM CHLORIDE 0.9% FLUSH: 3.0000 mL | INTRAVENOUS | Status: DC | PRN

## 2021-04-01 MED ORDER — SODIUM CHLORIDE 0.9 % IV BOLUS
20.0000 mL | Freq: Once | INTRAVENOUS | Status: AC
  Administered 2021-04-01: 20 mL via INTRAVENOUS

## 2021-04-01 MED ORDER — HEPARIN (PORCINE) IN NACL 1000-0.9 UT/500ML-% IV SOLN
INTRAVENOUS | Status: AC
  Filled 2021-04-01: qty 1000

## 2021-04-01 MED ORDER — HEPARIN SODIUM (PORCINE) 1000 UNIT/ML IJ SOLN
INTRAMUSCULAR | Status: AC
  Filled 2021-04-01: qty 1

## 2021-04-01 MED ORDER — TICAGRELOR 90 MG PO TABS
ORAL_TABLET | ORAL | Status: AC
  Filled 2021-04-01: qty 1

## 2021-04-01 MED ORDER — HEPARIN SODIUM (PORCINE) 5000 UNIT/ML IJ SOLN
4000.0000 [IU] | Freq: Once | INTRAMUSCULAR | Status: AC
  Administered 2021-04-01: 4000 [IU] via INTRAVENOUS
  Filled 2021-04-01: qty 1

## 2021-04-01 MED ORDER — LIDOCAINE HCL (PF) 1 % IJ SOLN
INTRAMUSCULAR | Status: DC | PRN
  Administered 2021-04-01: 2 mL via INTRADERMAL

## 2021-04-01 MED ORDER — ATORVASTATIN CALCIUM 80 MG PO TABS
80.0000 mg | ORAL_TABLET | Freq: Every day | ORAL | Status: DC
  Administered 2021-04-01 – 2021-04-04 (×3): 80 mg via ORAL
  Filled 2021-04-01 (×4): qty 1

## 2021-04-01 MED ORDER — SODIUM CHLORIDE 0.9 % IV SOLN: INTRAVENOUS | Status: AC

## 2021-04-01 MED ORDER — HEPARIN SODIUM (PORCINE) 1000 UNIT/ML IJ SOLN
INTRAMUSCULAR | Status: DC | PRN
  Administered 2021-04-01: 8000 [IU] via INTRAVENOUS

## 2021-04-01 MED ORDER — PERFLUTREN LIPID MICROSPHERE
1.0000 mL | INTRAVENOUS | Status: AC | PRN
  Administered 2021-04-01: 2 mL via INTRAVENOUS
  Filled 2021-04-01: qty 10

## 2021-04-01 MED ORDER — VERAPAMIL HCL 2.5 MG/ML IV SOLN
INTRAVENOUS | Status: DC | PRN
  Administered 2021-04-01: 10 mL via INTRA_ARTERIAL

## 2021-04-01 MED ORDER — IOHEXOL 350 MG/ML SOLN
INTRAVENOUS | Status: DC | PRN
  Administered 2021-04-01: 125 mL

## 2021-04-01 MED ORDER — ONDANSETRON HCL 4 MG/2ML IJ SOLN
4.0000 mg | Freq: Once | INTRAMUSCULAR | Status: AC
  Administered 2021-04-01: 4 mg via INTRAVENOUS
  Filled 2021-04-01: qty 2

## 2021-04-01 MED ORDER — LIDOCAINE HCL (PF) 1 % IJ SOLN
INTRAMUSCULAR | Status: AC
  Filled 2021-04-01: qty 30

## 2021-04-01 MED ORDER — TIROFIBAN HCL IN NACL 5-0.9 MG/100ML-% IV SOLN
INTRAVENOUS | Status: AC
  Filled 2021-04-01: qty 100

## 2021-04-01 MED ORDER — ASPIRIN 81 MG PO CHEW
81.0000 mg | CHEWABLE_TABLET | Freq: Every day | ORAL | Status: DC
  Administered 2021-04-02 – 2021-04-04 (×3): 81 mg via ORAL
  Filled 2021-04-01 (×4): qty 1

## 2021-04-01 MED ORDER — HYDRALAZINE HCL 20 MG/ML IJ SOLN: 10.0000 mg | INTRAMUSCULAR | Status: AC | PRN

## 2021-04-01 MED ORDER — TICAGRELOR 90 MG PO TABS
ORAL_TABLET | ORAL | Status: DC | PRN
  Administered 2021-04-01: 180 mg via ORAL

## 2021-04-01 MED ORDER — OXYCODONE HCL 5 MG PO TABS
5.0000 mg | ORAL_TABLET | ORAL | Status: DC | PRN
  Administered 2021-04-03: 5 mg via ORAL
  Filled 2021-04-01: qty 2

## 2021-04-01 MED ORDER — HEPARIN (PORCINE) IN NACL 1000-0.9 UT/500ML-% IV SOLN
INTRAVENOUS | Status: DC | PRN
  Administered 2021-04-01 (×2): 500 mL

## 2021-04-01 MED ORDER — FENTANYL CITRATE (PF) 100 MCG/2ML IJ SOLN
INTRAMUSCULAR | Status: AC
  Filled 2021-04-01: qty 2

## 2021-04-01 MED ORDER — FENTANYL CITRATE (PF) 100 MCG/2ML IJ SOLN
INTRAMUSCULAR | Status: DC | PRN
  Administered 2021-04-01: 50 ug via INTRAVENOUS

## 2021-04-01 MED ORDER — SODIUM CHLORIDE 0.9 % IV SOLN: 250.0000 mL | INTRAVENOUS | Status: DC | PRN

## 2021-04-01 MED ORDER — TICAGRELOR 90 MG PO TABS
90.0000 mg | ORAL_TABLET | Freq: Two times a day (BID) | ORAL | Status: DC
  Administered 2021-04-01 – 2021-04-03 (×4): 90 mg via ORAL
  Filled 2021-04-01 (×5): qty 1

## 2021-04-01 MED ORDER — ACETAMINOPHEN 325 MG PO TABS
650.0000 mg | ORAL_TABLET | Freq: Once | ORAL | Status: DC
  Filled 2021-04-01: qty 2

## 2021-04-01 MED ORDER — MIDAZOLAM HCL 2 MG/2ML IJ SOLN
INTRAMUSCULAR | Status: DC | PRN
  Administered 2021-04-01: 2 mg via INTRAVENOUS

## 2021-04-01 MED ORDER — NITROGLYCERIN 1 MG/10 ML FOR IR/CATH LAB
INTRA_ARTERIAL | Status: AC
  Filled 2021-04-01: qty 10

## 2021-04-01 SURGICAL SUPPLY — 18 items
BALLN SAPPHIRE 2.0X12 (BALLOONS) ×2
BALLN SAPPHIRE ~~LOC~~ 2.75X12 (BALLOONS) ×1 IMPLANT
BALLOON SAPPHIRE 2.0X12 (BALLOONS) IMPLANT
CATH INFINITI 5FR ANG PIGTAIL (CATHETERS) ×1 IMPLANT
CATH INFINITI JR4 5F (CATHETERS) ×1 IMPLANT
CATH VISTA GUIDE 6FR XBLAD3.5 (CATHETERS) ×1 IMPLANT
DEVICE RAD COMP TR BAND LRG (VASCULAR PRODUCTS) ×1 IMPLANT
GLIDESHEATH SLEND SS 6F .021 (SHEATH) ×1 IMPLANT
GUIDEWIRE INQWIRE 1.5J.035X260 (WIRE) IMPLANT
INQWIRE 1.5J .035X260CM (WIRE) ×2
KIT ENCORE 26 ADVANTAGE (KITS) ×1 IMPLANT
KIT HEART LEFT (KITS) ×2 IMPLANT
PACK CARDIAC CATHETERIZATION (CUSTOM PROCEDURE TRAY) ×2 IMPLANT
STENT ONYX FRONTIER 2.5X18 (Permanent Stent) ×1 IMPLANT
SYR MEDRAD MARK 7 150ML (SYRINGE) ×2 IMPLANT
TRANSDUCER W/STOPCOCK (MISCELLANEOUS) ×2 IMPLANT
TUBING CIL FLEX 10 FLL-RA (TUBING) ×2 IMPLANT
WIRE COUGAR XT STRL 190CM (WIRE) ×1 IMPLANT

## 2021-04-01 NOTE — Plan of Care (Signed)

## 2021-04-01 NOTE — ED Triage Notes (Signed)
BIB [facility] from work. Works as IT sales professional. No recent events. Mentions 10-19minutes of CP yesterday. Developed light headedness at work this morning. Also c/o chills and fatigue. Mentions sinus nasal congestion last week. No relief with APAP, Dayquil and/or sudafed. Only coffee with cream and sugar this am. Denies pain at this time.

## 2021-04-01 NOTE — H&P (Signed)
Cardiology Admission History and Physical:   Patient ID: Corey Robinson MRN: [id number]; DOB: May 10, 1976   Admission date: 04/01/2021  PCP:  Mechele Claude, MD   [facility] Providers Cardiologist:  None        Chief Complaint:  Chest Pain  Patient Profile:   Corey Robinson is a 45 y.o. male with no significant past medical history who is being seen 04/01/2021 for the evaluation of chest pain/STEMI.  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  He recently had flulike symptoms about 1 to 2 weeks ago and has tested positive for influenza here in the hospital.  He does not have any active fever, chills, or cough.  While in the emergency department, he was noted to have a mildly elevated troponin.  His EKG was initially within normal limits.  However, he developed severe substernal chest pain and marked ST elevation while in the department.  A code STEMI is called.  I came down to evaluate him emergently.  The patient is having severe 10/10 chest pain that just started a few minutes ago.  He has been having stuttering symptoms for the past few days.  He denies shortness of breath, orthopnea, PND, or heart palpitations.  He denies nausea, vomiting, or diaphoresis.  The patient is a smoker.  He smokes marijuana occasionally.  He drinks alcohol socially.  His family history is unknown because he is adopted.  He denies any other drug use.  No known history of hypertension or hyperlipidemia.   Past Medical History:  Diagnosis Date   Polycythemia     History reviewed. No pertinent surgical history.   Medications Prior to Admission: Prior to Admission medications   Medication Sig Start Date End Date Taking? Authorizing Provider  albuterol (PROVENTIL HFA;VENTOLIN HFA) 108 (90 Base) MCG/ACT inhaler Inhale 2 puffs into the lungs every 4 (four) hours as needed for wheezing or shortness of breath. 02/16/17    Mechele Claude, MD  varenicline (CHANTIX CONTINUING MONTH PAK) 1 MG tablet Take 1 tablet (1 mg total) by mouth 2 (two) times daily. 02/16/17   Mechele Claude, MD     Allergies:   No Known Allergies  Social History:   Social History   Socioeconomic History   Marital status: Married    Spouse name: Not on file   Number of children: Not on file   Years of education: Not on file   Highest education level: Not on file  Occupational History   Not on file  Tobacco Use   Smoking status: Every Day   Smokeless tobacco: Current  Substance and Sexual Activity   Alcohol use: Not on file   Drug use: Not on file   Sexual activity: Not on file  Other Topics Concern   Not on file  Social History Narrative   Not on file   Social Determinants of Health   Financial Resource Strain: Not on file  Food Insecurity: Not on file  Transportation Needs: Not on file  Physical Activity: Not on file  Stress: Not on file  Social Connections: Not on file  Intimate Partner Violence: Not on file    Family History:   The patient's family history is unknown as he is adopted  ROS:  Please see the history of present illness.  All other ROS reviewed and negative.     Physical Exam/Data:   Vitals:   04/01/21 1315 04/01/21 1325 04/01/21  1330 04/01/21 1345  BP: (!) 160/105 (!) 146/104 (!) 135/96 (!) 143/95  Pulse: 71 83 89 64  Resp: 17 13 12 14   Temp:      TempSrc:      SpO2: 100% 100% 100% 97%  Weight:       No intake or output data in the 24 hours ending 04/01/21 1425 Last 3 Weights 04/01/2021 02/16/2017 01/04/2017  Weight (lbs) 235 lb 242 lb 238 lb  Weight (kg) 106.595 kg 109.77 kg 107.956 kg     Body mass index is 32.78 kg/m.  General:  Well nourished, well developed, in moderate distress because of chest pain HEENT: normal Neck: no JVD Vascular: No carotid bruits; Distal pulses 2+ bilaterally   Cardiac:  normal S1, S2; RRR; no murmur  Lungs:  clear to auscultation bilaterally, no  wheezing, rhonchi or rales  Abd: soft, nontender, no hepatomegaly  Ext: no edema Musculoskeletal:  No deformities, BUE and BLE strength normal and equal Skin: warm and dry  Neuro:  CNs 2-12 intact, no focal abnormalities noted Psych:  Normal affect    EKG:  The ECG that was done was personally reviewed and demonstrates normal sinus rhythm with marked lateral ST elevation, QRS widening, consistent with acute inferolateral STEMI pattern  Relevant CV Studies: Pending  Laboratory Data:  High Sensitivity Troponin:   Recent Labs  Lab 04/01/21 1100 04/01/21 1300  TROPONINIHS 66* 88*      Chemistry Recent Labs  Lab 04/01/21 1100  NA 134*  K 3.9  CL 101  CO2 21*  GLUCOSE 109*  BUN 13  CREATININE 0.95  CALCIUM 9.0  GFRNONAA >60  ANIONGAP 12    No results for input(s): PROT, ALBUMIN, AST, ALT, ALKPHOS, BILITOT in the last 168 hours. Lipids No results for input(s): CHOL, TRIG, HDL, LABVLDL, LDLCALC, CHOLHDL in the last 168 hours. Hematology Recent Labs  Lab 04/01/21 1100  WBC 7.2  RBC 5.86*  HGB 17.6*  HCT 52.6*  MCV 89.8  MCH 30.0  MCHC 33.5  RDW 12.9  PLT 182   Thyroid No results for input(s): TSH, FREET4 in the last 168 hours. BNPNo results for input(s): BNP, PROBNP in the last 168 hours.  DDimer  Recent Labs  Lab 04/01/21 1100  DDIMER <0.27     Radiology/Studies:  DG Chest Port 1 View  Result Date: 04/01/2021 CLINICAL DATA:  Chest pain EXAM: PORTABLE CHEST 1 VIEW COMPARISON:  None. FINDINGS: The heart size and mediastinal contours are within normal limits. Both lungs are clear. The visualized skeletal structures are unremarkable. IMPRESSION: No active disease. Electronically Signed   By: 13/01/2021 D.O.   On: 04/01/2021 11:44     Assessment and Plan:   Acute inferolateral STEMI: The patient has acute onset severe substernal chest pain with stuttering intermittent symptoms over the past few days.  EKG is diagnostic of STEMI.  The patient is taken  emergently for cardiac catheterization and primary PCI.  Emergency implied consent is obtained.  The patient is administered 324 mg of chewable aspirin and 4000 units of unfractionated heparin in the emergency department.  I called his wife and discussed the patient's case with her.  She understands it we are taking him to the cardiac catheterization lab emergently.  Further plans/disposition pending his cardiac catheterization results. Tobacco abuse: Cessation counseling will be done when appropriate  Disposition: Determined based on cardiac catheterization results and clinical course.   Risk Assessment/Risk Scores:    TIMI Risk Score for ST  Elevation MI:   The patient's TIMI risk score is 0, which indicates a 0.8% risk of all cause mortality at 30 days.        Severity of Illness: The appropriate patient status for this patient is INPATIENT. Inpatient status is judged to be reasonable and necessary in order to provide the required intensity of service to ensure the patient's safety. The patient's presenting symptoms, physical exam findings, and initial radiographic and laboratory data in the context of their chronic comorbidities is felt to place them at high risk for further clinical deterioration. Furthermore, it is not anticipated that the patient will be medically stable for discharge from the hospital within 2 midnights of admission.   * I certify that at the point of admission it is my clinical judgment that the patient will require inpatient hospital care spanning beyond 2 midnights from the point of admission due to high intensity of service, high risk for further deterioration and high frequency of surveillance required.*   For questions or updates, please contact [facility] Please consult [email or web address] for contact info under     Signed, Tonny Bollman, MD  04/01/2021 2:25 PM

## 2021-04-01 NOTE — Progress Notes (Signed)
  Echocardiogram 2D Echocardiogram with contrast has been performed.  Corey Robinson 04/01/2021, 5:43 PM

## 2021-04-01 NOTE — ED Notes (Signed)
Pt's dad Brett Canales [phone]) notified that pt going to cath lab per pt request.  Cardiologist spoke to pt's wife on phone.

## 2021-04-01 NOTE — Progress Notes (Signed)
  Echocardiogram 2D Echocardiogram has been performed.  Roosvelt Maser F 04/01/2021, 5:41 PM

## 2021-04-01 NOTE — ED Provider Notes (Signed)
[facility] EMERGENCY DEPARTMENT Provider Note   CSN: [id number] Arrival date & time: 04/01/21  1043     History Chief Complaint  Patient presents with   Dizziness    KAGEN URGILES is a 45 y.o. male.  45 year old male brought in by EMS with complaint of feeling unwell today.  Patient states that yesterday while at work he had sudden onset of tightness on the left side of his chest and felt unwell.  Reports escalation of pain and then resolution within 10 to 15 minutes, denies associated shortness of breath, diaphoresis, nausea.  Denies exertional symptoms, pain does not radiate.  Patient states today he took his kids to school, went to work and was feeling fine, while sitting at his desk he began to feel lightheaded and unwell.  Patient went to his coworker, reports his blood pressure was sky high and EMS was called.  Continues to feel unwell at this time although denies chest pain, shortness of breath or other more specific complaints or concerns.  History of elevated cholesterol, took fish oil, not compliant at this time and not on any medications otherwise.  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  No known family history, adopted. Chart lists diabetes however A1C from 2018 5.7, PCP note lists completed due to elevated blood sugar on pre employment physical.       Past Medical History:  Diagnosis Date   Polycythemia     Patient Active Problem List   Diagnosis Date Noted   Diabetes mellitus without complication ([place]) [id number]   Dyspnea 01/04/2017   Contact dermatitis 01/20/2015   Polycythemia     History reviewed. No pertinent surgical history.     History reviewed. No pertinent family history. Family history unknown, adopted.  Social History   Tobacco Use   Smoking status: Every Day   Smokeless tobacco: Current    Home Medications Prior to Admission medications   Medication Sig Start Date End Date Taking?  Authorizing Provider  albuterol (PROVENTIL HFA;VENTOLIN HFA) 108 (90 Base) MCG/ACT inhaler Inhale 2 puffs into the lungs every 4 (four) hours as needed for wheezing or shortness of breath. 02/16/17   Claretta Fraise, MD  varenicline (CHANTIX CONTINUING MONTH PAK) 1 MG tablet Take 1 tablet (1 mg total) by mouth 2 (two) times daily. 02/16/17   Claretta Fraise, MD    Allergies    Patient has no known allergies.  Review of Systems   Review of Systems  Constitutional:  Negative for chills, diaphoresis and fever.  Respiratory:  Negative for shortness of breath.   Cardiovascular:  Positive for chest pain. Negative for leg swelling.  Gastrointestinal:  Negative for abdominal pain, constipation, diarrhea, nausea and vomiting.  Genitourinary:  Negative for difficulty urinating and dysuria.  Musculoskeletal:  Negative for arthralgias, back pain and myalgias.  Skin:  Negative for rash and wound.  Allergic/Immunologic: Positive for immunocompromised state.  Neurological:  Positive for light-headedness. Negative for weakness.  Hematological:  Negative for adenopathy.  Psychiatric/Behavioral:  Negative for confusion.   All other systems reviewed and are negative.  Physical Exam Updated Vital Signs BP (!) 143/95   Pulse 64   Temp 98.5 F (36.9 C) (Oral)   Resp 14   Wt 106.6 kg   SpO2 97%   BMI 32.78 kg/m   Physical Exam Vitals and nursing note reviewed.  Constitutional:      General: He is not in acute distress.    Appearance:  He is well-developed. He is not diaphoretic.  HENT:     Head: Normocephalic and atraumatic.  Cardiovascular:     Rate and Rhythm: Normal rate and regular rhythm.     Heart sounds: Normal heart sounds.  Pulmonary:     Effort: Pulmonary effort is normal.     Breath sounds: Normal breath sounds.  Chest:     Chest wall: No tenderness.  Abdominal:     Palpations: Abdomen is soft.     Tenderness: There is no abdominal tenderness.  Musculoskeletal:     Cervical back:  Neck supple.     Right lower leg: No edema.     Left lower leg: No edema.  Skin:    General: Skin is warm and dry.     Findings: No erythema or rash.  Neurological:     Mental Status: He is alert and oriented to person, place, and time.  Psychiatric:        Behavior: Behavior normal.    ED Results / Procedures / Treatments   Labs (all labs ordered are listed, but only abnormal results are displayed) Labs Reviewed  RESP PANEL BY RT-PCR (FLU A&B, COVID) ARPGX2 - Abnormal; Notable for the following components:      Result Value   Influenza A by PCR POSITIVE (*)    All other components within normal limits  BASIC METABOLIC PANEL - Abnormal; Notable for the following components:   Sodium 134 (*)    CO2 21 (*)    Glucose, Bld 109 (*)    All other components within normal limits  CBC - Abnormal; Notable for the following components:   RBC 5.86 (*)    Hemoglobin 17.6 (*)    HCT 52.6 (*)    All other components within normal limits  TROPONIN I (HIGH SENSITIVITY) - Abnormal; Notable for the following components:   Troponin I (High Sensitivity) 66 (*)    All other components within normal limits  TROPONIN I (HIGH SENSITIVITY) - Abnormal; Notable for the following components:   Troponin I (High Sensitivity) 88 (*)    All other components within normal limits  D-DIMER, QUANTITATIVE  HEMOGLOBIN A1C  PROTIME-INR  APTT  LIPID PANEL    EKG None  Radiology DG Chest Port 1 View  Result Date: 04/01/2021 CLINICAL DATA:  Chest pain EXAM: PORTABLE CHEST 1 VIEW COMPARISON:  None. FINDINGS: The heart size and mediastinal contours are within normal limits. Both lungs are clear. The visualized skeletal structures are unremarkable. IMPRESSION: No active disease. Electronically Signed   By: Larose Hires D.O.   On: 04/01/2021 11:44    Procedures .Critical Care Performed by: Jeannie Fend, PA-C Authorized by: Jeannie Fend, PA-C   Critical care provider statement:    Critical care time  (minutes):  45   Critical care time was exclusive of:  Separately billable procedures and treating other patients   Critical care was necessary to treat or prevent imminent or life-threatening deterioration of the following conditions:  Cardiac failure, circulatory failure, dehydration, metabolic crisis, sepsis and shock   Critical care was time spent personally by me on the following activities:  Development of treatment plan with patient or surrogate, discussions with consultants, evaluation of patient's response to treatment, examination of patient, interpretation of cardiac output measurements, obtaining history from patient or surrogate, review of old charts, re-evaluation of patient's condition, pulse oximetry, ordering and review of radiographic studies, ordering and review of laboratory studies and ordering and performing treatments and interventions  Care discussed with: admitting provider     Medications Ordered in ED Medications  nitroGLYCERIN (NITROSTAT) SL tablet 0.4 mg ( Sublingual MAR Hold 04/01/21 1426)  nitroGLYCERIN 50 mg in dextrose 5 % 250 mL (0.2 mg/mL) infusion (10 mcg/min Intravenous New Bag/Given 04/01/21 1423)  acetaminophen (TYLENOL) tablet 650 mg ( Oral MAR Hold 04/01/21 1426)  lidocaine (PF) (XYLOCAINE) 1 % injection (2 mLs Intradermal Given 04/01/21 1429)  Radial Cocktail/Verapamil only (10 mLs Intra-arterial Given 04/01/21 1429)  heparin sodium (porcine) injection (8,000 Units Intravenous Given 04/01/21 1430)  fentaNYL (SUBLIMAZE) injection (50 mcg Intravenous Given 04/01/21 1426)  midazolam (VERSED) injection (2 mg Intravenous Given 04/01/21 1426)  Heparin (Porcine) in NaCl 1000-0.9 UT/500ML-% SOLN (500 mLs  Given 04/01/21 1433)  sodium chloride 0.9 % bolus 20 mL (20 mLs Intravenous New Bag/Given 04/01/21 1230)  aspirin chewable tablet 324 mg (324 mg Oral Given 04/01/21 1324)  morphine 4 MG/ML injection 4 mg ( Intravenous MAR Hold 04/01/21 1426)  ondansetron (ZOFRAN) injection  4 mg ( Intravenous MAR Hold 04/01/21 1426)  heparin injection 4,000 Units (4,000 Units Intravenous Given 04/01/21 1408)    ED Course  I have reviewed the triage vital signs and the nursing notes.  Pertinent labs & imaging results that were available during my care of the patient were reviewed by me and considered in my medical decision making (see chart for details).  Clinical Course as of 04/01/21 1436  Wed Apr 02, 6851  5521 45 year old male brought in by EMS with complaint of chest pain which occurred yesterday and resolved after 10 to 15 minutes.  Today while at work felt lightheaded and generally unwell, found to be hypertensive at work and was brought to the emergency room for further evaluation.  No complaints of chest pain today. Patient is well-appearing on exam, blood pressure elevated on arrival at 171/107, improved to 130s over 90s. EKG without acute ischemic changes, CBC with elevated RBC/hemoglobin/hematocrit, history of polycythemia on chart.  BMP without significant findings. Initial troponin returns elevated at 66, due to his history of polycythemia (patient unaware of this diagnosis, states he was told his blood was thick once so he donated blood) add on D-dimer to evaluate for PE.  Repeat troponin pending. Patient is a daily smoker- 1-1.5 PPD. [LM]  1321 Informed by patient's nurse, patient is reported left-sided chest pain at this time.  Request repeat EKG, will give aspirin and nitro. Chest pain described as tight in nature, located on left side of his chest, radiating down his left arm.  Pain somewhat improved when he sat up in bed although continues to have pain. Repeat EKG unchanged from initial. [LM]  1357 Discussed with Dr. Burt Knack with cardiology  Improvement in pain after nitro down to a 2 then with return of pain after third dose of nitro.  Transition to nitro drip with morphine.  Also given Tylenol for his headache.   [LM]  1407 Repeat EKG concerning for MI, Dr.  Burt Knack paged, secretary notified to call code STEMI. STEMI orders initiated. [LM]  72 Dr. Burt Knack to bedside and patient transported to cath lab.  [LM]    Clinical Course User Index [LM] Roque Lias   MDM Rules/Calculators/A&P                           Final Clinical Impression(s) / ED Diagnoses Final diagnoses:  ST elevation myocardial infarction (STEMI), unspecified artery ([place])  Influenza A  Rx / DC Orders ED Discharge Orders     None        Roque Lias 04/01/21 1436    Davonna Belling, MD 04/01/21 1927

## 2021-04-01 NOTE — ED Notes (Signed)
NSTEMI changedd to STEMI.  Flu positive. Dr. Excell Seltzer cards, EDP, pharmD at [facility]. Pt to cath lab, 2 IVs, on ntg gtt, O2 [place], heparin 4000mg , morphine4mg  , ASA324mg  and zofran4mg  given. On zoll.

## 2021-04-02 ENCOUNTER — Other Ambulatory Visit (HOSPITAL_COMMUNITY): Payer: Self-pay

## 2021-04-02 ENCOUNTER — Encounter (HOSPITAL_COMMUNITY): Payer: Self-pay | Admitting: Cardiovascular Disease

## 2021-04-02 LAB — LIPID PANEL
Cholesterol: 213 mg/dL — ABNORMAL HIGH (ref 0–200)
HDL: 27 mg/dL — ABNORMAL LOW (ref >40)
LDL Cholesterol: 130 mg/dL — ABNORMAL HIGH (ref 0–99)
Total CHOL/HDL Ratio: 7.9 RATIO
Triglycerides: 278 mg/dL — ABNORMAL HIGH (ref <150)
VLDL: 56 mg/dL — ABNORMAL HIGH (ref 0–40)

## 2021-04-02 LAB — BASIC METABOLIC PANEL
Anion gap: 9 (ref 5–15)
BUN: 12 mg/dL (ref 6–20)
CO2: 20 mmol/L — ABNORMAL LOW (ref 22–32)
Calcium: 8.3 mg/dL — ABNORMAL LOW (ref 8.9–10.3)
Chloride: 107 mmol/L (ref 98–111)
Creatinine, Ser: 0.91 mg/dL (ref 0.61–1.24)
GFR, Estimated: >60 mL/min (ref >60)
Glucose, Bld: 97 mg/dL (ref 70–99)
Potassium: 4.4 mmol/L (ref 3.5–5.1)
Sodium: 136 mmol/L (ref 135–145)

## 2021-04-02 LAB — CBC
HCT: 47.3 % (ref 39.0–52.0)
Hemoglobin: 15.8 g/dL (ref 13.0–17.0)
MCH: 29.6 pg (ref 26.0–34.0)
MCHC: 33.4 g/dL (ref 30.0–36.0)
MCV: 88.7 fL (ref 80.0–100.0)
Platelets: 172 10*3/uL (ref 150–400)
RBC: 5.33 MIL/uL (ref 4.22–5.81)
RDW: 13.2 % (ref 11.5–15.5)
WBC: 9.3 10*3/uL (ref 4.0–10.5)
nRBC: 0 % (ref 0.0–0.2)

## 2021-04-02 LAB — SURGICAL PCR SCREEN
MRSA, PCR: NEGATIVE
Staphylococcus aureus: NEGATIVE

## 2021-04-02 MED ORDER — LABETALOL HCL 5 MG/ML IV SOLN
10.0000 mg | INTRAVENOUS | Status: DC | PRN
  Administered 2021-04-02 – 2021-04-03 (×3): 10 mg via INTRAVENOUS
  Filled 2021-04-02 (×2): qty 4

## 2021-04-02 MED ORDER — SODIUM CHLORIDE 0.9 % WEIGHT BASED INFUSION
1.0000 mL/kg/h | INTRAVENOUS | Status: DC
  Administered 2021-04-03: 1 mL/kg/h via INTRAVENOUS

## 2021-04-02 MED ORDER — ALPRAZOLAM 0.5 MG PO TABS
0.5000 mg | ORAL_TABLET | Freq: Three times a day (TID) | ORAL | Status: DC | PRN
  Administered 2021-04-02: 0.5 mg via ORAL
  Filled 2021-04-02: qty 1

## 2021-04-02 MED ORDER — NICOTINE 14 MG/24HR TD PT24
14.0000 mg | MEDICATED_PATCH | Freq: Every day | TRANSDERMAL | Status: DC
  Administered 2021-04-02 – 2021-04-04 (×3): 14 mg via TRANSDERMAL
  Filled 2021-04-02 (×3): qty 1

## 2021-04-02 MED ORDER — SODIUM CHLORIDE 0.9% FLUSH: 3.0000 mL | INTRAVENOUS | Status: DC | PRN

## 2021-04-02 MED ORDER — CHLORHEXIDINE GLUCONATE CLOTH 2 % EX PADS
6.0000 | MEDICATED_PAD | Freq: Every day | CUTANEOUS | Status: DC
  Administered 2021-04-02: 6 via TOPICAL

## 2021-04-02 MED ORDER — SODIUM CHLORIDE 0.9% FLUSH
3.0000 mL | Freq: Two times a day (BID) | INTRAVENOUS | Status: DC
  Administered 2021-04-02 – 2021-04-04 (×4): 3 mL via INTRAVENOUS

## 2021-04-02 MED ORDER — SODIUM CHLORIDE 0.9 % IV SOLN: 250.0000 mL | INTRAVENOUS | Status: DC | PRN

## 2021-04-02 MED ORDER — MUPIROCIN 2 % EX OINT
1.0000 "application " | TOPICAL_OINTMENT | Freq: Two times a day (BID) | CUTANEOUS | Status: DC
  Administered 2021-04-02 – 2021-04-03 (×3): 1 via NASAL
  Filled 2021-04-02: qty 22

## 2021-04-02 MED ORDER — METOPROLOL TARTRATE 50 MG PO TABS
50.0000 mg | ORAL_TABLET | Freq: Two times a day (BID) | ORAL | Status: DC
  Administered 2021-04-02 – 2021-04-04 (×3): 50 mg via ORAL
  Filled 2021-04-02 (×3): qty 1

## 2021-04-02 MED ORDER — METOPROLOL TARTRATE 25 MG PO TABS
25.0000 mg | ORAL_TABLET | Freq: Once | ORAL | Status: AC
  Administered 2021-04-02: 25 mg via ORAL
  Filled 2021-04-02: qty 1

## 2021-04-02 MED ORDER — SODIUM CHLORIDE 0.9 % WEIGHT BASED INFUSION
3.0000 mL/kg/h | INTRAVENOUS | Status: DC
  Administered 2021-04-03: 3 mL/kg/h via INTRAVENOUS

## 2021-04-02 NOTE — TOC Benefit Eligibility Note (Signed)
Patient Product/process development scientist completed.    The patient is currently admitted and upon discharge could be taking Brilinta 90 mg.  The current 30 day co-pay is, $15.00.   The patient is insured through [employer] of [facility]     Roland Earl, CPhT Pharmacy Patient Advocate Specialist [facility] Patient Advocate Team Direct Number: [phone]  Fax: [phone]

## 2021-04-02 NOTE — Interval H&P Note (Signed)
Cath Lab Visit (complete for each Cath Lab visit)  Clinical Evaluation Leading to the Procedure:   ACS: Yes.    Non-ACS:    Anginal Classification: CCS III  Anti-ischemic medical therapy: Minimal Therapy (1 class of medications)  Non-Invasive Test Results: No non-invasive testing performed  Prior CABG: No previous CABG      History and Physical Interval Note:  04/02/2021 8:46 PM  Corey Robinson  has presented today for surgery, with the diagnosis of CAD.  The various methods of treatment have been discussed with the patient and family. After consideration of risks, benefits and other options for treatment, the patient has consented to  Procedure(s): CORONARY STENT INTERVENTION (N/A) as a surgical intervention.  The patient's history has been reviewed, patient examined, no change in status, stable for surgery.  I have reviewed the patient's chart and labs.  Questions were answered to the patient's satisfaction.     Lyn Records III

## 2021-04-02 NOTE — Progress Notes (Signed)
Progress Note  Patient Name: Corey Robinson Date of Encounter: 04/02/2021  [facility] Cardiologist: None   Subjective   Feeling anxious this morning.  No chest pain or shortness of breath.  Feels a little bit lightheaded.  Inpatient Medications    Scheduled Meds:  aspirin  81 mg Oral Daily   atorvastatin  80 mg Oral Daily   Chlorhexidine Gluconate Cloth  6 each Topical Daily   metoprolol tartrate  25 mg Oral BID   sodium chloride flush  3 mL Intravenous Q12H   ticagrelor  90 mg Oral BID   Continuous Infusions:  sodium chloride     PRN Meds: sodium chloride, acetaminophen, nitroGLYCERIN, ondansetron (ZOFRAN) IV, oxyCODONE, sodium chloride flush   Vital Signs    Vitals:   04/02/21 0000 04/02/21 0100 04/02/21 0200 04/02/21 0600  BP: 122/85 114/68 106/73   Pulse: 62 64 (!) 56   Resp: (!) 21 16 10    Temp:      TempSrc:      SpO2: 97% 99% 98%   Weight:    100.6 kg  Height:        Intake/Output Summary (Last 24 hours) at 04/02/2021 0715 Last data filed at 04/02/2021 0200 Gross per 24 hour  Intake 476.79 ml  Output --  Net 476.79 ml   Last 3 Weights 04/02/2021 04/01/2021 02/16/2017  Weight (lbs) 221 lb 12.5 oz 235 lb 242 lb  Weight (kg) 100.6 kg 106.595 kg 109.77 kg      Telemetry    Normal sinus rhythm with no significant arrhythmia heart rate approximately 70 bpm - Personally Reviewed  ECG    Normal sinus rhythm, within normal limits- Personally Reviewed  Physical Exam  Alert, oriented, no distress GEN: No acute distress.   Neck: No JVD Cardiac: RRR, no murmurs, rubs, or gallops.  Respiratory: Clear to auscultation bilaterally. GI: Soft, nontender, non-distended  MS: No edema; No deformity.  Right radial cath site is clear Neuro:  Nonfocal  Psych: Normal affect   Labs    High Sensitivity Troponin:   Recent Labs  Lab 04/01/21 1100 04/01/21 1300 04/01/21 1623 04/01/21 1855  TROPONINIHS 66* 88* 903* 1,205*     Chemistry Recent Labs   Lab 04/01/21 1100 04/02/21 0134  NA 134* 136  K 3.9 4.4  CL 101 107  CO2 21* 20*  GLUCOSE 109* 97  BUN 13 12  CREATININE 0.95 0.91  CALCIUM 9.0 8.3*  GFRNONAA >60 >60  ANIONGAP 12 9    Lipids  Recent Labs  Lab 04/01/21 1402  CHOL 235*  TRIG 181*  HDL 36*  LDLCALC 163*  CHOLHDL 6.5    Hematology Recent Labs  Lab 04/01/21 1100 04/02/21 0134  WBC 7.2 9.3  RBC 5.86* 5.33  HGB 17.6* 15.8  HCT 52.6* 47.3  MCV 89.8 88.7  MCH 30.0 29.6  MCHC 33.5 33.4  RDW 12.9 13.2  PLT 182 172   Thyroid No results for input(s): TSH, FREET4 in the last 168 hours.  BNPNo results for input(s): BNP, PROBNP in the last 168 hours.  DDimer  Recent Labs  Lab 04/01/21 1100  DDIMER <0.27     Radiology    CARDIAC CATHETERIZATION  Result Date: 04/01/2021   Prox RCA-1 lesion is 60% stenosed.   Prox RCA-2 lesion is 70% stenosed.   1st Mrg lesion is 99% stenosed.   Prox LAD to Mid LAD lesion is 20% stenosed.   A drug-eluting stent was successfully placed using a STENT  ONYX FRONTIER 2.5X18.   Post intervention, there is a 0% residual stenosis. Acute inferolateral STEMI secondary to sub-total occlusion of the first obtuse marginal branch. Successful PTCA/DES x 1 first obtuse marginal branch Mild plaque in the mid LAD Severe proximal RCA stenosis. The RCA is a dominant vessel. Recommendations: Will admit to the ICU. DAPT with ASA/Brilinta for one year. Aggrastat drip for two hours post PCI. Will start high intensity statin and a low dose beta blocker. Echo in the am. He will need staged PCI of the RCA prior to discharge.   DG Chest Port 1 View  Result Date: 04/01/2021 CLINICAL DATA:  Chest pain EXAM: PORTABLE CHEST 1 VIEW COMPARISON:  None. FINDINGS: The heart size and mediastinal contours are within normal limits. Both lungs are clear. The visualized skeletal structures are unremarkable. IMPRESSION: No active disease. Electronically Signed   By: Larose Hires D.O.   On: 04/01/2021 11:44    ECHOCARDIOGRAM COMPLETE  Result Date: 04/01/2021    ECHOCARDIOGRAM REPORT   Patient Name:   Corey Robinson Date of Exam: 04/01/2021 Medical Rec #:  [id number]        Height:       71.0 in Accession #:    [id number]       Weight:       235.0 lb Date of Birth:  1975/09/22         BSA:          2.258 m Patient Age:    45 years         BP:           119/82 mmHg Patient Gender: M                HR:           61 bpm. Exam Location:  Inpatient Procedure: 2D Echo, Cardiac Doppler, Color Doppler and Intracardiac            Opacification Agent Indications:    CAD  History:        Patient has no prior history of Echocardiogram examinations.                 Acute MI and CAD; Cath today.  Sonographer:    Roosvelt Maser RDCS Referring Phys: [id number] CHRISTOPHER D MCALHANY IMPRESSIONS  1. Left ventricular ejection fraction, by estimation, is 55 to 60%. The left ventricle has normal function. Left ventricular endocardial border not optimally defined to evaluate regional wall motion though no definite wall motion abnormalities noted. Left ventricular diastolic parameters are consistent with Grade I diastolic dysfunction (impaired relaxation).  2. Right ventricular systolic function is normal. The right ventricular size is normal. Tricuspid regurgitation signal is inadequate for assessing PA pressure.  3. The mitral valve is normal in structure. No evidence of mitral valve regurgitation. No evidence of mitral stenosis.  4. The aortic valve is tricuspid. Aortic valve regurgitation is not visualized. No aortic stenosis is present.  5. Technically difficult study with poor acoustic windows. FINDINGS  Left Ventricle: Left ventricular ejection fraction, by estimation, is 55 to 60%. The left ventricle has normal function. Left ventricular endocardial border not optimally defined to evaluate regional wall motion. The left ventricular internal cavity size was normal in size. There is no left ventricular hypertrophy. Left ventricular diastolic  parameters are consistent with Grade I diastolic dysfunction (impaired relaxation). Right Ventricle: The right ventricular size is normal. No increase in right ventricular wall thickness. Right ventricular systolic function is normal. Tricuspid  regurgitation signal is inadequate for assessing PA pressure. Left Atrium: Left atrial size was normal in size. Right Atrium: Right atrial size was normal in size. Pericardium: There is no evidence of pericardial effusion. Mitral Valve: The mitral valve is normal in structure. No evidence of mitral valve regurgitation. No evidence of mitral valve stenosis. Tricuspid Valve: The tricuspid valve is normal in structure. Tricuspid valve regurgitation is not demonstrated. Aortic Valve: The aortic valve is tricuspid. Aortic valve regurgitation is not visualized. No aortic stenosis is present. Aortic valve mean gradient measures 2.0 mmHg. Aortic valve peak gradient measures 4.2 mmHg. Aortic valve area, by VTI measures 3.43 cm. Pulmonic Valve: The pulmonic valve was normal in structure. Pulmonic valve regurgitation is not visualized. Aorta: The aortic root is normal in size and structure. Venous: The inferior vena cava was not well visualized. IAS/Shunts: No atrial level shunt detected by color flow Doppler.  LEFT VENTRICLE PLAX 2D LVIDd:         4.30 cm   Diastology LVIDs:         2.90 cm   LV e' medial:    7.72 cm/s LV PW:         1.20 cm   LV E/e' medial:  9.2 LV IVS:        1.00 cm   LV e' lateral:   8.27 cm/s LVOT diam:     2.30 cm   LV E/e' lateral: 8.5 LV SV:         69 LV SV Index:   31 LVOT Area:     4.15 cm  RIGHT VENTRICLE RV Basal diam:  3.30 cm LEFT ATRIUM             Index        RIGHT ATRIUM           Index LA diam:        4.10 cm 1.82 cm/m   RA Area:     15.80 cm LA Vol (A2C):   65.8 ml 29.14 ml/m  RA Volume:   37.60 ml  16.65 ml/m LA Vol (A4C):   48.2 ml 21.34 ml/m LA Biplane Vol: 57.1 ml 25.29 ml/m  AORTIC VALVE AV Area (Vmax):    3.44 cm AV Area (Vmean):    3.17 cm AV Area (VTI):     3.43 cm AV Vmax:           102.00 cm/s AV Vmean:          67.900 cm/s AV VTI:            0.201 m AV Peak Grad:      4.2 mmHg AV Mean Grad:      2.0 mmHg LVOT Vmax:         84.40 cm/s LVOT Vmean:        51.800 cm/s LVOT VTI:          0.166 m LVOT/AV VTI ratio: 0.83  AORTA Ao Root diam: 3.70 cm MITRAL VALVE MV Area (PHT): 3.68 cm    SHUNTS MV Decel Time: 206 msec    Systemic VTI:  0.17 m MV E velocity: 70.70 cm/s  Systemic Diam: 2.30 cm MV A velocity: 82.70 cm/s MV E/A ratio:  0.85 Dalton McleanMD Electronically signed by Wilfred Lacy Signature Date/Time: 04/01/2021/7:30:58 PM    Final     Cardiac Studies   Cath and echo studies reviewed - see above.   Patient Profile     45 y.o. male with inferolateral STEMI  04/01/21 secondary to critical OM1 stenosis  Assessment & Plan    STEMI involving the LCx: treated with Primary PCI. Severe residual RCA stenosis, plan for staged PCI tomorrow prior to DC. On ASA, ticagrelor, metoprolol, high-intensity statin. I have reviewed data around staged PCI in setting of STEMI for treatment of severe residual CAD in a proximal coronary artery with the patient today. Risks, indications, and alternatives reviewed with the patient who agrees to proceed.  He is uncertain about what he wants to do with the timing of staged PCI.  He is considering tomorrow morning prior to discharge versus returning electively for outpatient PCI.  I think either approach would be reasonable.  He is going to discuss with his wife and let me know what he decides.  We have a spot for him tomorrow morning with Dr. Katrinka Blazing if he decides to proceed as an inpatient. Tobacco abuse: cessation counseling done.  He does not seem ready to quit based on our discussion this morning. Lipids: on high-intensity statin. Needs lipid panel.   Disposition: Await his decision regarding staged PCI.  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   Fortunately, his LV function is preserved.  For questions or updates, please contact [facility] Please consult [email or web address] for contact info under        Signed, Tonny Bollman, MD  04/02/2021, 7:15 AM

## 2021-04-02 NOTE — Progress Notes (Signed)
CARDIAC REHAB PHASE I   Began MI education with pt and family. Pt educated on importance of ASA, Brilinta, statin, and NTG. Pt given MI book and heart healthy diet. Discussed smoking cessation, pt open to cutting back, but seems hesitant to completely quit as he enjoys it. Reviewed site care, restrictions, and exercise guidelines. Will refer to CRP II [place]. Will return later as able to ambulate.  [id number] Reynold Bowen, RN BSN 04/02/2021 10:10 AM

## 2021-04-02 NOTE — Plan of Care (Signed)
  Problem: Cardiovascular: Goal: Ability to achieve and maintain adequate cardiovascular perfusion will improve Outcome: Progressing Goal: Vascular access site(s) Level 0-1 will be maintained Outcome: Progressing   

## 2021-04-02 NOTE — H&P (View-Only) (Signed)
Progress Note  Patient Name: Corey Robinson Date of Encounter: 04/02/2021  [facility] Cardiologist: None   Subjective   Feeling anxious this morning.  No chest pain or shortness of breath.  Feels a little bit lightheaded.  Inpatient Medications    Scheduled Meds:  aspirin  81 mg Oral Daily   atorvastatin  80 mg Oral Daily   Chlorhexidine Gluconate Cloth  6 each Topical Daily   metoprolol tartrate  25 mg Oral BID   sodium chloride flush  3 mL Intravenous Q12H   ticagrelor  90 mg Oral BID   Continuous Infusions:  sodium chloride     PRN Meds: sodium chloride, acetaminophen, nitroGLYCERIN, ondansetron (ZOFRAN) IV, oxyCODONE, sodium chloride flush   Vital Signs    Vitals:   04/02/21 0000 04/02/21 0100 04/02/21 0200 04/02/21 0600  BP: 122/85 114/68 106/73   Pulse: 62 64 (!) 56   Resp: (!) 21 16 10    Temp:      TempSrc:      SpO2: 97% 99% 98%   Weight:    100.6 kg  Height:        Intake/Output Summary (Last 24 hours) at 04/02/2021 0715 Last data filed at 04/02/2021 0200 Gross per 24 hour  Intake 476.79 ml  Output --  Net 476.79 ml   Last 3 Weights 04/02/2021 04/01/2021 02/16/2017  Weight (lbs) 221 lb 12.5 oz 235 lb 242 lb  Weight (kg) 100.6 kg 106.595 kg 109.77 kg      Telemetry    Normal sinus rhythm with no significant arrhythmia heart rate approximately 70 bpm - Personally Reviewed  ECG    Normal sinus rhythm, within normal limits- Personally Reviewed  Physical Exam  Alert, oriented, no distress GEN: No acute distress.   Neck: No JVD Cardiac: RRR, no murmurs, rubs, or gallops.  Respiratory: Clear to auscultation bilaterally. GI: Soft, nontender, non-distended  MS: No edema; No deformity.  Right radial cath site is clear Neuro:  Nonfocal  Psych: Normal affect   Labs    High Sensitivity Troponin:   Recent Labs  Lab 04/01/21 1100 04/01/21 1300 04/01/21 1623 04/01/21 1855  TROPONINIHS 66* 88* 903* 1,205*     Chemistry Recent Labs   Lab 04/01/21 1100 04/02/21 0134  NA 134* 136  K 3.9 4.4  CL 101 107  CO2 21* 20*  GLUCOSE 109* 97  BUN 13 12  CREATININE 0.95 0.91  CALCIUM 9.0 8.3*  GFRNONAA >60 >60  ANIONGAP 12 9    Lipids  Recent Labs  Lab 04/01/21 1402  CHOL 235*  TRIG 181*  HDL 36*  LDLCALC 163*  CHOLHDL 6.5    Hematology Recent Labs  Lab 04/01/21 1100 04/02/21 0134  WBC 7.2 9.3  RBC 5.86* 5.33  HGB 17.6* 15.8  HCT 52.6* 47.3  MCV 89.8 88.7  MCH 30.0 29.6  MCHC 33.5 33.4  RDW 12.9 13.2  PLT 182 172   Thyroid No results for input(s): TSH, FREET4 in the last 168 hours.  BNPNo results for input(s): BNP, PROBNP in the last 168 hours.  DDimer  Recent Labs  Lab 04/01/21 1100  DDIMER <0.27     Radiology    CARDIAC CATHETERIZATION  Result Date: 04/01/2021   Prox RCA-1 lesion is 60% stenosed.   Prox RCA-2 lesion is 70% stenosed.   1st Mrg lesion is 99% stenosed.   Prox LAD to Mid LAD lesion is 20% stenosed.   A drug-eluting stent was successfully placed using a STENT  ONYX FRONTIER 2.5X18.   Post intervention, there is a 0% residual stenosis. Acute inferolateral STEMI secondary to sub-total occlusion of the first obtuse marginal branch. Successful PTCA/DES x 1 first obtuse marginal branch Mild plaque in the mid LAD Severe proximal RCA stenosis. The RCA is a dominant vessel. Recommendations: Will admit to the ICU. DAPT with ASA/Brilinta for one year. Aggrastat drip for two hours post PCI. Will start high intensity statin and a low dose beta blocker. Echo in the am. He will need staged PCI of the RCA prior to discharge.   DG Chest Port 1 View  Result Date: 04/01/2021 CLINICAL DATA:  Chest pain EXAM: PORTABLE CHEST 1 VIEW COMPARISON:  None. FINDINGS: The heart size and mediastinal contours are within normal limits. Both lungs are clear. The visualized skeletal structures are unremarkable. IMPRESSION: No active disease. Electronically Signed   By: Larose Hires D.O.   On: 04/01/2021 11:44    ECHOCARDIOGRAM COMPLETE  Result Date: 04/01/2021    ECHOCARDIOGRAM REPORT   Patient Name:   Corey Robinson Date of Exam: 04/01/2021 Medical Rec #:  [id number]        Height:       71.0 in Accession #:    [id number]       Weight:       235.0 lb Date of Birth:  1975/09/22         BSA:          2.258 m Patient Age:    45 years         BP:           119/82 mmHg Patient Gender: M                HR:           61 bpm. Exam Location:  Inpatient Procedure: 2D Echo, Cardiac Doppler, Color Doppler and Intracardiac            Opacification Agent Indications:    CAD  History:        Patient has no prior history of Echocardiogram examinations.                 Acute MI and CAD; Cath today.  Sonographer:    Roosvelt Maser RDCS Referring Phys: [id number] CHRISTOPHER D MCALHANY IMPRESSIONS  1. Left ventricular ejection fraction, by estimation, is 55 to 60%. The left ventricle has normal function. Left ventricular endocardial border not optimally defined to evaluate regional wall motion though no definite wall motion abnormalities noted. Left ventricular diastolic parameters are consistent with Grade I diastolic dysfunction (impaired relaxation).  2. Right ventricular systolic function is normal. The right ventricular size is normal. Tricuspid regurgitation signal is inadequate for assessing PA pressure.  3. The mitral valve is normal in structure. No evidence of mitral valve regurgitation. No evidence of mitral stenosis.  4. The aortic valve is tricuspid. Aortic valve regurgitation is not visualized. No aortic stenosis is present.  5. Technically difficult study with poor acoustic windows. FINDINGS  Left Ventricle: Left ventricular ejection fraction, by estimation, is 55 to 60%. The left ventricle has normal function. Left ventricular endocardial border not optimally defined to evaluate regional wall motion. The left ventricular internal cavity size was normal in size. There is no left ventricular hypertrophy. Left ventricular diastolic  parameters are consistent with Grade I diastolic dysfunction (impaired relaxation). Right Ventricle: The right ventricular size is normal. No increase in right ventricular wall thickness. Right ventricular systolic function is normal. Tricuspid  regurgitation signal is inadequate for assessing PA pressure. Left Atrium: Left atrial size was normal in size. Right Atrium: Right atrial size was normal in size. Pericardium: There is no evidence of pericardial effusion. Mitral Valve: The mitral valve is normal in structure. No evidence of mitral valve regurgitation. No evidence of mitral valve stenosis. Tricuspid Valve: The tricuspid valve is normal in structure. Tricuspid valve regurgitation is not demonstrated. Aortic Valve: The aortic valve is tricuspid. Aortic valve regurgitation is not visualized. No aortic stenosis is present. Aortic valve mean gradient measures 2.0 mmHg. Aortic valve peak gradient measures 4.2 mmHg. Aortic valve area, by VTI measures 3.43 cm. Pulmonic Valve: The pulmonic valve was normal in structure. Pulmonic valve regurgitation is not visualized. Aorta: The aortic root is normal in size and structure. Venous: The inferior vena cava was not well visualized. IAS/Shunts: No atrial level shunt detected by color flow Doppler.  LEFT VENTRICLE PLAX 2D LVIDd:         4.30 cm   Diastology LVIDs:         2.90 cm   LV e' medial:    7.72 cm/s LV PW:         1.20 cm   LV E/e' medial:  9.2 LV IVS:        1.00 cm   LV e' lateral:   8.27 cm/s LVOT diam:     2.30 cm   LV E/e' lateral: 8.5 LV SV:         69 LV SV Index:   31 LVOT Area:     4.15 cm  RIGHT VENTRICLE RV Basal diam:  3.30 cm LEFT ATRIUM             Index        RIGHT ATRIUM           Index LA diam:        4.10 cm 1.82 cm/m   RA Area:     15.80 cm LA Vol (A2C):   65.8 ml 29.14 ml/m  RA Volume:   37.60 ml  16.65 ml/m LA Vol (A4C):   48.2 ml 21.34 ml/m LA Biplane Vol: 57.1 ml 25.29 ml/m  AORTIC VALVE AV Area (Vmax):    3.44 cm AV Area (Vmean):    3.17 cm AV Area (VTI):     3.43 cm AV Vmax:           102.00 cm/s AV Vmean:          67.900 cm/s AV VTI:            0.201 m AV Peak Grad:      4.2 mmHg AV Mean Grad:      2.0 mmHg LVOT Vmax:         84.40 cm/s LVOT Vmean:        51.800 cm/s LVOT VTI:          0.166 m LVOT/AV VTI ratio: 0.83  AORTA Ao Root diam: 3.70 cm MITRAL VALVE MV Area (PHT): 3.68 cm    SHUNTS MV Decel Time: 206 msec    Systemic VTI:  0.17 m MV E velocity: 70.70 cm/s  Systemic Diam: 2.30 cm MV A velocity: 82.70 cm/s MV E/A ratio:  0.85 Dalton McleanMD Electronically signed by Wilfred Lacy Signature Date/Time: 04/01/2021/7:30:58 PM    Final     Cardiac Studies   Cath and echo studies reviewed - see above.   Patient Profile     45 y.o. male with inferolateral STEMI  04/01/21 secondary to critical OM1 stenosis  Assessment & Plan    STEMI involving the LCx: treated with Primary PCI. Severe residual RCA stenosis, plan for staged PCI tomorrow prior to DC. On ASA, ticagrelor, metoprolol, high-intensity statin. I have reviewed data around staged PCI in setting of STEMI for treatment of severe residual CAD in a proximal coronary artery with the patient today. Risks, indications, and alternatives reviewed with the patient who agrees to proceed.  He is uncertain about what he wants to do with the timing of staged PCI.  He is considering tomorrow morning prior to discharge versus returning electively for outpatient PCI.  I think either approach would be reasonable.  He is going to discuss with his wife and let me know what he decides.  We have a spot for him tomorrow morning with Dr. Smith if he decides to proceed as an inpatient. Tobacco abuse: cessation counseling done.  He does not seem ready to quit based on our discussion this morning. Lipids: on high-intensity statin. Needs lipid panel.   Disposition: Await his decision regarding staged PCI.  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   Fortunately, his LV function is preserved.  For questions or updates, please contact [facility] Please consult [email or web address] for contact info under        Signed, Doyle Kunath, MD  04/02/2021, 7:15 AM    

## 2021-04-03 ENCOUNTER — Encounter (HOSPITAL_COMMUNITY): Payer: Self-pay | Admitting: Interventional Cardiology

## 2021-04-03 ENCOUNTER — Encounter (HOSPITAL_COMMUNITY)
Admission: EM | Disposition: A | Discharge status: Home / Self Care | Payer: Self-pay | Source: Home / Self Care | Attending: Cardiovascular Disease

## 2021-04-03 ENCOUNTER — Inpatient Hospital Stay (HOSPITAL_COMMUNITY): Payer: BLUE CROSS/BLUE SHIELD

## 2021-04-03 DIAGNOSIS — Z720 Tobacco use: Secondary | ICD-10-CM

## 2021-04-03 DIAGNOSIS — I2510 Atherosclerotic heart disease of native coronary artery without angina pectoris: Secondary | ICD-10-CM

## 2021-04-03 DIAGNOSIS — I2109 ST elevation (STEMI) myocardial infarction involving other coronary artery of anterior wall: Secondary | ICD-10-CM

## 2021-04-03 DIAGNOSIS — E785 Hyperlipidemia, unspecified: Secondary | ICD-10-CM

## 2021-04-03 HISTORY — PX: CORONARY ANGIOGRAPHY: CATH118303

## 2021-04-03 HISTORY — DX: Atherosclerotic heart disease of native coronary artery without angina pectoris: I25.10

## 2021-04-03 HISTORY — PX: CORONARY STENT INTERVENTION: CATH118234

## 2021-04-03 LAB — TROPONIN I (HIGH SENSITIVITY)
Troponin I (High Sensitivity): 755 ng/L (ref <18)
Troponin I (High Sensitivity): 149 ng/L (ref <18)

## 2021-04-03 LAB — POCT ACTIVATED CLOTTING TIME
Activated Clotting Time: 324 seconds
Activated Clotting Time: 358 seconds
Activated Clotting Time: 445 seconds
Activated Clotting Time: 202 seconds

## 2021-04-03 LAB — ECHOCARDIOGRAM LIMITED
Height: 71 in
Weight: 3548.52 oz

## 2021-04-03 SURGERY — CORONARY STENT INTERVENTION
Anesthesia: LOCAL

## 2021-04-03 MED ORDER — OXYCODONE-ACETAMINOPHEN 5-325 MG PO TABS: 1.0000 | ORAL_TABLET | ORAL | Status: DC | PRN

## 2021-04-03 MED ORDER — ASPIRIN 81 MG PO CHEW: 81.0000 mg | CHEWABLE_TABLET | Freq: Every day | ORAL | Status: DC

## 2021-04-03 MED ORDER — NITROGLYCERIN 1 MG/10 ML FOR IR/CATH LAB
INTRA_ARTERIAL | Status: DC | PRN
  Administered 2021-04-03 (×4): 200 ug via INTRACORONARY

## 2021-04-03 MED ORDER — SODIUM CHLORIDE 0.9 % IV SOLN: INTRAVENOUS | Status: AC

## 2021-04-03 MED ORDER — FENTANYL CITRATE PF 50 MCG/ML IJ SOSY: 50.0000 ug | PREFILLED_SYRINGE | INTRAMUSCULAR | Status: DC | PRN

## 2021-04-03 MED ORDER — SODIUM CHLORIDE 0.9% FLUSH
3.0000 mL | Freq: Two times a day (BID) | INTRAVENOUS | Status: DC
  Administered 2021-04-03 – 2021-04-04 (×3): 3 mL via INTRAVENOUS

## 2021-04-03 MED ORDER — IOHEXOL 350 MG/ML SOLN
INTRAVENOUS | Status: DC | PRN
  Administered 2021-04-03: 160 mL via INTRA_ARTERIAL

## 2021-04-03 MED ORDER — ENOXAPARIN SODIUM 40 MG/0.4ML IJ SOSY
40.0000 mg | PREFILLED_SYRINGE | INTRAMUSCULAR | Status: DC
  Administered 2021-04-04: 40 mg via SUBCUTANEOUS
  Filled 2021-04-03: qty 0.4

## 2021-04-03 MED ORDER — SODIUM CHLORIDE 0.9 % IV SOLN: 250.0000 mL | INTRAVENOUS | Status: DC | PRN

## 2021-04-03 MED ORDER — NITROGLYCERIN IN D5W 200-5 MCG/ML-% IV SOLN
INTRAVENOUS | Status: AC | PRN
  Administered 2021-04-03: 10 ug/min via INTRAVENOUS

## 2021-04-03 MED ORDER — LABETALOL HCL 5 MG/ML IV SOLN: 10.0000 mg | INTRAVENOUS | Status: AC | PRN

## 2021-04-03 MED ORDER — MIDAZOLAM HCL 2 MG/2ML IJ SOLN
INTRAMUSCULAR | Status: AC
  Filled 2021-04-03: qty 2

## 2021-04-03 MED ORDER — LIDOCAINE HCL (PF) 1 % IJ SOLN
INTRAMUSCULAR | Status: DC | PRN
  Administered 2021-04-03: 2 mL

## 2021-04-03 MED ORDER — LIDOCAINE HCL (PF) 1 % IJ SOLN
INTRAMUSCULAR | Status: AC
  Filled 2021-04-03: qty 30

## 2021-04-03 MED ORDER — FENTANYL CITRATE (PF) 100 MCG/2ML IJ SOLN
INTRAMUSCULAR | Status: DC | PRN
  Administered 2021-04-03 (×4): 25 ug via INTRAVENOUS

## 2021-04-03 MED ORDER — IOHEXOL 350 MG/ML SOLN: INTRAVENOUS | Status: DC | PRN

## 2021-04-03 MED ORDER — HEPARIN (PORCINE) IN NACL 1000-0.9 UT/500ML-% IV SOLN
INTRAVENOUS | Status: DC | PRN
  Administered 2021-04-03 (×2): 500 mL

## 2021-04-03 MED ORDER — HEPARIN SODIUM (PORCINE) 1000 UNIT/ML IJ SOLN
INTRAMUSCULAR | Status: AC
  Filled 2021-04-03: qty 1

## 2021-04-03 MED ORDER — NITROGLYCERIN 1 MG/10 ML FOR IR/CATH LAB
INTRA_ARTERIAL | Status: AC
  Filled 2021-04-03: qty 10

## 2021-04-03 MED ORDER — MIDAZOLAM HCL 2 MG/2ML IJ SOLN
INTRAMUSCULAR | Status: DC | PRN
  Administered 2021-04-03 (×3): 1 mg via INTRAVENOUS

## 2021-04-03 MED ORDER — FENTANYL CITRATE (PF) 100 MCG/2ML IJ SOLN
INTRAMUSCULAR | Status: AC
  Filled 2021-04-03: qty 2

## 2021-04-03 MED ORDER — NITROGLYCERIN 1 MG/10 ML FOR IR/CATH LAB
INTRA_ARTERIAL | Status: DC | PRN
  Administered 2021-04-03: 200 ug via INTRACORONARY

## 2021-04-03 MED ORDER — FENTANYL CITRATE (PF) 100 MCG/2ML IJ SOLN
INTRAMUSCULAR | Status: DC | PRN
  Administered 2021-04-03: 50 ug via INTRAVENOUS

## 2021-04-03 MED ORDER — HYDRALAZINE HCL 20 MG/ML IJ SOLN: 10.0000 mg | INTRAMUSCULAR | Status: AC | PRN

## 2021-04-03 MED ORDER — HEPARIN (PORCINE) IN NACL 1000-0.9 UT/500ML-% IV SOLN
INTRAVENOUS | Status: AC
  Filled 2021-04-03: qty 1000

## 2021-04-03 MED ORDER — VERAPAMIL HCL 2.5 MG/ML IV SOLN
INTRAVENOUS | Status: DC | PRN
  Administered 2021-04-03: 10 mL via INTRA_ARTERIAL

## 2021-04-03 MED ORDER — VERAPAMIL HCL 2.5 MG/ML IV SOLN
INTRAVENOUS | Status: AC
  Filled 2021-04-03: qty 2

## 2021-04-03 MED ORDER — ACETAMINOPHEN 325 MG PO TABS
650.0000 mg | ORAL_TABLET | ORAL | Status: DC | PRN
  Administered 2021-04-03: 650 mg via ORAL

## 2021-04-03 MED ORDER — FENTANYL CITRATE (PF) 100 MCG/2ML IJ SOLN
50.0000 ug | INTRAMUSCULAR | Status: DC | PRN
  Administered 2021-04-03: 50 ug via INTRAVENOUS

## 2021-04-03 MED ORDER — HEPARIN SODIUM (PORCINE) 1000 UNIT/ML IJ SOLN
INTRAMUSCULAR | Status: DC | PRN
  Administered 2021-04-03: 9000 [IU] via INTRAVENOUS
  Administered 2021-04-03: 2500 [IU] via INTRAVENOUS

## 2021-04-03 MED ORDER — SODIUM CHLORIDE 0.9% FLUSH: 3.0000 mL | INTRAVENOUS | Status: DC | PRN

## 2021-04-03 MED ORDER — ONDANSETRON HCL 4 MG/2ML IJ SOLN: 4.0000 mg | Freq: Four times a day (QID) | INTRAMUSCULAR | Status: DC | PRN

## 2021-04-03 MED ORDER — TICAGRELOR 90 MG PO TABS
90.0000 mg | ORAL_TABLET | Freq: Two times a day (BID) | ORAL | Status: DC
  Administered 2021-04-03 – 2021-04-04 (×2): 90 mg via ORAL
  Filled 2021-04-03 (×2): qty 1

## 2021-04-03 MED ORDER — NITROGLYCERIN IN D5W 200-5 MCG/ML-% IV SOLN
INTRAVENOUS | Status: AC
  Filled 2021-04-03: qty 250

## 2021-04-03 MED ORDER — LIDOCAINE HCL (PF) 1 % IJ SOLN
INTRAMUSCULAR | Status: DC | PRN
  Administered 2021-04-03: 15 mL

## 2021-04-03 SURGICAL SUPPLY — 23 items
BALL SAPPHIRE NC24 4.0X22 (BALLOONS) ×4
BALLN SAPPHIRE 2.5X20 (BALLOONS) ×2
BALLOON SAPPHIRE 2.5X20 (BALLOONS) IMPLANT
BALLOON SAPPHIRE NC24 4.0X22 (BALLOONS) IMPLANT
CATH INFINITI 5FR MULTPACK ANG (CATHETERS) ×1 IMPLANT
CATH VISTA GUIDE 6FR JR4 (CATHETERS) ×1 IMPLANT
DEVICE RAD COMP TR BAND LRG (VASCULAR PRODUCTS) ×1 IMPLANT
GLIDESHEATH SLEND A-KIT 6F 22G (SHEATH) ×1 IMPLANT
GUIDEWIRE INQWIRE 1.5J.035X260 (WIRE) IMPLANT
INQWIRE 1.5J .035X260CM (WIRE) ×2
KIT ENCORE 26 ADVANTAGE (KITS) ×1 IMPLANT
KIT HEART LEFT (KITS) ×3 IMPLANT
KIT MICROPUNCTURE NIT STIFF (SHEATH) ×1 IMPLANT
PACK CARDIAC CATHETERIZATION (CUSTOM PROCEDURE TRAY) ×3 IMPLANT
SHEATH PINNACLE 5F 10CM (SHEATH) ×1 IMPLANT
SHEATH PROBE COVER 6X72 (BAG) ×1 IMPLANT
STENT SYNERGY XD 3.50X48 (Permanent Stent) IMPLANT
SYNERGY XD 3.50X48 (Permanent Stent) ×2 IMPLANT
TRANSDUCER W/STOPCOCK (MISCELLANEOUS) ×3 IMPLANT
TUBING CIL FLEX 10 FLL-RA (TUBING) ×3 IMPLANT
WIRE ASAHI PROWATER 180CM (WIRE) ×2 IMPLANT
WIRE EMERALD 3MM-J .035X150CM (WIRE) ×1 IMPLANT
WIRE EMERALD ST .035X150CM (WIRE) IMPLANT

## 2021-04-03 NOTE — Plan of Care (Signed)

## 2021-04-03 NOTE — CV Procedure (Addendum)
48 x 3.5 Synergy postdilated to 4.0 mm. Occlusion of a small to moderate size RV branch with ongoing chest pain at conclusion of procedure.  Wire rescue of RV branch did not improve flow.  Procedure was terminated. Post PCI ACT was greater than 300 seconds. Post EKG Anterior STE. will relook to make sure LAD is okay.  Not sure why RV acute occlusion would cause such significant ST elevation.  We will now need to go femoral. After discussing with colleagues, we felt best that LAD territory was documented to be widely patent.  For this reason diagnostic cath was performed via right femoral using real-time vascular ultrasound to visualize entry site.  Single anterior wall stick with Seldinger needle.  Left Judkins was used for left coronary angiography demonstrated a widely patent LAD left main and circumflex territory.  The previously placed stent in the obtuse marginal is widely patent.  The right coronary is widely patent.  The moderate sized right ventricular branch remains totally occluded.  An additional 200 mcg of IC nitroglycerin was administered.  The patient received a total of 800 mcg of IC nitroglycerin after RV branch occlusion.

## 2021-04-03 NOTE — Progress Notes (Addendum)
Progress Note  Patient Name: Corey Robinson Date of Encounter: 04/03/2021  [facility] Cardiologist: None   Subjective   Chest pain is improving. Currently resting in bed. Wife at the bedside.   Inpatient Medications    Scheduled Meds:  aspirin  81 mg Oral Daily   atorvastatin  80 mg Oral Daily   Chlorhexidine Gluconate Cloth  6 each Topical Daily   [START ON 04/04/2021] enoxaparin (LOVENOX) injection  40 mg Subcutaneous Q24H   metoprolol tartrate  50 mg Oral BID   mupirocin ointment  1 application Nasal BID   nicotine  14 mg Transdermal Daily   sodium chloride flush  3 mL Intravenous Q12H   sodium chloride flush  3 mL Intravenous Q12H   sodium chloride flush  3 mL Intravenous Q12H   ticagrelor  90 mg Oral BID   Continuous Infusions:  sodium chloride 125 mL/hr at 04/03/21 1016   sodium chloride     PRN Meds: sodium chloride, acetaminophen, acetaminophen, ALPRAZolam, fentaNYL (SUBLIMAZE) injection, hydrALAZINE, labetalol, labetalol, nitroGLYCERIN, ondansetron (ZOFRAN) IV, ondansetron (ZOFRAN) IV, oxyCODONE, sodium chloride flush, sodium chloride flush   Vital Signs    Vitals:   04/03/21 1120 04/03/21 1125 04/03/21 1130 04/03/21 1200  BP: 123/88 (!) 147/95 140/90 (!) 130/94  Pulse: (!) 55 64 75 75  Resp: 14 11 12 12   Temp:    98 F (36.7 C)  TempSrc:    Oral  SpO2: 100% 99% 99% 99%  Weight:      Height:        Intake/Output Summary (Last 24 hours) at 04/03/2021 1253 Last data filed at 04/03/2021 0407 Gross per 24 hour  Intake 246 ml  Output 925 ml  Net -679 ml   Last 3 Weights 04/02/2021 04/01/2021 02/16/2017  Weight (lbs) 221 lb 12.5 oz 235 lb 242 lb  Weight (kg) 100.6 kg 106.595 kg 109.77 kg      Telemetry    SR, persistent ST elevation - Personally Reviewed  ECG    Post cath EKG (0903)--> SR with  anterior ST elevation- Personally Reviewed  Physical Exam   GEN: No acute distress. Laying in bed Neck: No JVD Cardiac: RRR, no murmurs, rubs,  or gallops.  Respiratory: Clear to auscultation bilaterally. GI: Soft, nontender, non-distended  MS: No edema; No deformity. Right radial TR band in place. Right femoral cath site stable. Neuro:  Nonfocal  Psych: Normal affect   Labs    High Sensitivity Troponin:   Recent Labs  Lab 04/01/21 1100 04/01/21 1300 04/01/21 1623 04/01/21 1855  TROPONINIHS 66* 88* 903* 1,205*     Chemistry Recent Labs  Lab 04/01/21 1100 04/02/21 0134  NA 134* 136  K 3.9 4.4  CL 101 107  CO2 21* 20*  GLUCOSE 109* 97  BUN 13 12  CREATININE 0.95 0.91  CALCIUM 9.0 8.3*  GFRNONAA >60 >60  ANIONGAP 12 9    Lipids  Recent Labs  Lab 04/02/21 0134  CHOL 213*  TRIG 278*  HDL 27*  LDLCALC 130*  CHOLHDL 7.9    Hematology Recent Labs  Lab 04/01/21 1100 04/02/21 0134  WBC 7.2 9.3  RBC 5.86* 5.33  HGB 17.6* 15.8  HCT 52.6* 47.3  MCV 89.8 88.7  MCH 30.0 29.6  MCHC 33.5 33.4  RDW 12.9 13.2  PLT 182 172   Thyroid No results for input(s): TSH, FREET4 in the last 168 hours.  BNPNo results for input(s): BNP, PROBNP in the last 168 hours.  DDimer  Recent Labs  Lab 04/01/21 1100  DDIMER <0.27     Radiology    CARDIAC CATHETERIZATION  Result Date: 04/03/2021   Prox LAD to Mid LAD lesion is 20% stenosed.   Prox RCA-2 lesion is 70% stenosed.   Prox RCA-1 lesion is 60% stenosed.   Non-stenotic 1st Mrg lesion was previously treated.   Non-stenotic RV Branch lesion.   A stent was successfully placed.   Post intervention, there is a 0% residual stenosis.   Post intervention, there is a 0% residual stenosis.   CARDIAC CATHETERIZATION  Result Date: 04/01/2021   Prox RCA-1 lesion is 60% stenosed.   Prox RCA-2 lesion is 70% stenosed.   1st Mrg lesion is 99% stenosed.   Prox LAD to Mid LAD lesion is 20% stenosed.   A drug-eluting stent was successfully placed using a STENT ONYX FRONTIER 2.5X18.   Post intervention, there is a 0% residual stenosis. Acute inferolateral STEMI secondary to sub-total  occlusion of the first obtuse marginal branch. Successful PTCA/DES x 1 first obtuse marginal branch Mild plaque in the mid LAD Severe proximal RCA stenosis. The RCA is a dominant vessel. Recommendations: Will admit to the ICU. DAPT with ASA/Brilinta for one year. Aggrastat drip for two hours post PCI. Will start high intensity statin and a low dose beta blocker. Echo in the am. He will need staged PCI of the RCA prior to discharge.   ECHOCARDIOGRAM COMPLETE  Result Date: 04/01/2021    ECHOCARDIOGRAM REPORT   Patient Name:   Corey Robinson Date of Exam: 04/01/2021 Medical Rec #:  [id number]        Height:       71.0 in Accession #:    [id number]       Weight:       235.0 lb Date of Birth:  [birth year]         BSA:          2.258 m Patient Age:    45 years         BP:           119/82 mmHg Patient Gender: M                HR:           61 bpm. Exam Location:  Inpatient Procedure: 2D Echo, Cardiac Doppler, Color Doppler and Intracardiac            Opacification Agent Indications:    CAD  History:        Patient has no prior history of Echocardiogram examinations.                 Acute MI and CAD; Cath today.  Sonographer:    Roosvelt Maser RDCS Referring Phys: [id number] CHRISTOPHER D MCALHANY IMPRESSIONS  1. Left ventricular ejection fraction, by estimation, is 55 to 60%. The left ventricle has normal function. Left ventricular endocardial border not optimally defined to evaluate regional wall motion though no definite wall motion abnormalities noted. Left ventricular diastolic parameters are consistent with Grade I diastolic dysfunction (impaired relaxation).  2. Right ventricular systolic function is normal. The right ventricular size is normal. Tricuspid regurgitation signal is inadequate for assessing PA pressure.  3. The mitral valve is normal in structure. No evidence of mitral valve regurgitation. No evidence of mitral stenosis.  4. The aortic valve is tricuspid. Aortic valve regurgitation is not visualized. No aortic  stenosis is present.  5. Technically difficult study with poor acoustic windows.  FINDINGS  Left Ventricle: Left ventricular ejection fraction, by estimation, is 55 to 60%. The left ventricle has normal function. Left ventricular endocardial border not optimally defined to evaluate regional wall motion. The left ventricular internal cavity size was normal in size. There is no left ventricular hypertrophy. Left ventricular diastolic parameters are consistent with Grade I diastolic dysfunction (impaired relaxation). Right Ventricle: The right ventricular size is normal. No increase in right ventricular wall thickness. Right ventricular systolic function is normal. Tricuspid regurgitation signal is inadequate for assessing PA pressure. Left Atrium: Left atrial size was normal in size. Right Atrium: Right atrial size was normal in size. Pericardium: There is no evidence of pericardial effusion. Mitral Valve: The mitral valve is normal in structure. No evidence of mitral valve regurgitation. No evidence of mitral valve stenosis. Tricuspid Valve: The tricuspid valve is normal in structure. Tricuspid valve regurgitation is not demonstrated. Aortic Valve: The aortic valve is tricuspid. Aortic valve regurgitation is not visualized. No aortic stenosis is present. Aortic valve mean gradient measures 2.0 mmHg. Aortic valve peak gradient measures 4.2 mmHg. Aortic valve area, by VTI measures 3.43 cm. Pulmonic Valve: The pulmonic valve was normal in structure. Pulmonic valve regurgitation is not visualized. Aorta: The aortic root is normal in size and structure. Venous: The inferior vena cava was not well visualized. IAS/Shunts: No atrial level shunt detected by color flow Doppler.  LEFT VENTRICLE PLAX 2D LVIDd:         4.30 cm   Diastology LVIDs:         2.90 cm   LV e' medial:    7.72 cm/s LV PW:         1.20 cm   LV E/e' medial:  9.2 LV IVS:        1.00 cm   LV e' lateral:   8.27 cm/s LVOT diam:     2.30 cm   LV E/e' lateral:  8.5 LV SV:         69 LV SV Index:   31 LVOT Area:     4.15 cm  RIGHT VENTRICLE RV Basal diam:  3.30 cm LEFT ATRIUM             Index        RIGHT ATRIUM           Index LA diam:        4.10 cm 1.82 cm/m   RA Area:     15.80 cm LA Vol (A2C):   65.8 ml 29.14 ml/m  RA Volume:   37.60 ml  16.65 ml/m LA Vol (A4C):   48.2 ml 21.34 ml/m LA Biplane Vol: 57.1 ml 25.29 ml/m  AORTIC VALVE AV Area (Vmax):    3.44 cm AV Area (Vmean):   3.17 cm AV Area (VTI):     3.43 cm AV Vmax:           102.00 cm/s AV Vmean:          67.900 cm/s AV VTI:            0.201 m AV Peak Grad:      4.2 mmHg AV Mean Grad:      2.0 mmHg LVOT Vmax:         84.40 cm/s LVOT Vmean:        51.800 cm/s LVOT VTI:          0.166 m LVOT/AV VTI ratio: 0.83  AORTA Ao Root diam: 3.70 cm MITRAL VALVE MV Area (PHT): 3.68  cm    SHUNTS MV Decel Time: 206 msec    Systemic VTI:  0.17 m MV E velocity: 70.70 cm/s  Systemic Diam: 2.30 cm MV A velocity: 82.70 cm/s MV E/A ratio:  0.85 Dalton McleanMD Electronically signed by Wilfred Lacy Signature Date/Time: 04/01/2021/7:30:58 PM    Final     Cardiac Studies   Cath: 04/01/21   Prox RCA-1 lesion is 60% stenosed.   Prox RCA-2 lesion is 70% stenosed.   1st Mrg lesion is 99% stenosed.   Prox LAD to Mid LAD lesion is 20% stenosed.   A drug-eluting stent was successfully placed using a STENT ONYX FRONTIER 2.5X18.   Post intervention, there is a 0% residual stenosis.   Acute inferolateral STEMI secondary to sub-total occlusion of the first obtuse marginal branch.  Successful PTCA/DES x 1 first obtuse marginal branch Mild plaque in the mid LAD Severe proximal RCA stenosis. The RCA is a dominant vessel.    Recommendations: Will admit to the ICU. DAPT with ASA/Brilinta for one year. Aggrastat drip for two hours post PCI. Will start high intensity statin and a low dose beta blocker. Echo in the am. He will need staged PCI of the RCA prior to discharge.   Diagnostic Dominance:  Right Intervention    Cath: 04/03/21   Prox LAD to Mid LAD lesion is 20% stenosed.   Prox RCA-2 lesion is 70% stenosed.   Prox RCA-1 lesion is 60% stenosed.   Non-stenotic 1st Mrg lesion was previously treated.   Non-stenotic RV Branch lesion.   A stent was successfully placed.   Post intervention, there is a 0% residual stenosis.   Post intervention, there is a 0% residual stenosis.  Diagnostic Dominance: Right Intervention    Echo: 04/01/21  IMPRESSIONS     1. Left ventricular ejection fraction, by estimation, is 55 to 60%. The  left ventricle has normal function. Left ventricular endocardial border  not optimally defined to evaluate regional wall motion though no definite  wall motion abnormalities noted.  Left ventricular diastolic parameters are consistent with Grade I  diastolic dysfunction (impaired relaxation).   2. Right ventricular systolic function is normal. The right ventricular  size is normal. Tricuspid regurgitation signal is inadequate for assessing  PA pressure.   3. The mitral valve is normal in structure. No evidence of mitral valve  regurgitation. No evidence of mitral stenosis.   4. The aortic valve is tricuspid. Aortic valve regurgitation is not  visualized. No aortic stenosis is present.   5. Technically difficult study with poor acoustic windows.   FINDINGS   Left Ventricle: Left ventricular ejection fraction, by estimation, is 55  to 60%. The left ventricle has normal function. Left ventricular  endocardial border not optimally defined to evaluate regional wall motion.  The left ventricular internal cavity  size was normal in size. There is no left ventricular hypertrophy. Left  ventricular diastolic parameters are consistent with Grade I diastolic  dysfunction (impaired relaxation).   Right Ventricle: The right ventricular size is normal. No increase in  right ventricular wall thickness. Right ventricular systolic function is  normal.  Tricuspid regurgitation signal is inadequate for assessing PA  pressure.   Left Atrium: Left atrial size was normal in size.   Right Atrium: Right atrial size was normal in size.   Pericardium: There is no evidence of pericardial effusion.   Mitral Valve: The mitral valve is normal in structure. No evidence of  mitral valve regurgitation. No evidence of mitral valve  stenosis.   Tricuspid Valve: The tricuspid valve is normal in structure. Tricuspid  valve regurgitation is not demonstrated.   Aortic Valve: The aortic valve is tricuspid. Aortic valve regurgitation is  not visualized. No aortic stenosis is present. Aortic valve mean gradient  measures 2.0 mmHg. Aortic valve peak gradient measures 4.2 mmHg. Aortic  valve area, by VTI measures 3.43  cm.   Pulmonic Valve: The pulmonic valve was normal in structure. Pulmonic valve  regurgitation is not visualized.   Aorta: The aortic root is normal in size and structure.   Venous: The inferior vena cava was not well visualized.   IAS/Shunts: No atrial level shunt detected by color flow Doppler.    Patient Profile     45 y.o. male with PMH of tobacco use and HLD who presented with inferolateral STEMI 04/01/21 secondary to critical OM1 stenosis.   Assessment & Plan    STEMI: Underwent PCI/DES to left circumflex 11/9. hsTn peaked at 1205. Did have residual RCA stenosis and underwent staged intervention with Dr. Katrinka Blazing this morning.  Had successful PCI/DES x1 to mid RCA with occlusion of small to moderate RV branch in which the patient developed chest pain lingering into the conclusion of procedure.  Wire rescue of RV branch was attempted but did not improve flow.  Post cath EKG showed anterior ST elevation.  Relook cath was done showing patent LAD along with patent stent in the OM.  -- limited echo pending -- remains on DAPT with ASA/Brilinta, statin, metoprolol  BID  HLD: LDL 130 -- now on statin therapy -- needs FLP/LFTs in 8  weeks  Pre DM: Hgb A1c 6.1  Tobacco use: cessation advised -- nicotine patch  For questions or updates, please contact [facility] Please consult [email or web address] for contact info under     Signed, Laverda Page, NP  04/03/2021, 12:53 PM    Patient seen, examined. Available data reviewed. Agree with findings, assessment, and plan as outlined by Laverda Page, NP.  The patient is independently interviewed and examined.  He is resting comfortably, currently with minimal chest discomfort.  HEENT normal, JVP normal, lungs clear bilaterally, heart regular rate and rhythm no murmur gallop, abdomen soft and nontender, extremities with no edema.  The patient underwent staged PCI of the right coronary artery this morning complicated by sidebranch occlusion of an RV marginal branch.  He subsequently had marked anterior ST elevation and emergent relook cardiac catheterization was done showing wide patency of the coronary arteries.  The patient only complains of headache at present and relates this to IV nitroglycerin.  We will try to wean off his IV nitroglycerin over the afternoon hours as I would anticipate his chest pain will resolve.  As long as he is clinically stable tomorrow morning, he should be ready for discharge home.  Medicine program as outlined above.  Limited echo images reviewed and shows normal LV wall motion with normal LVEF.  Tonny Bollman, M.D. 04/03/2021 1:52 PM

## 2021-04-03 NOTE — Progress Notes (Signed)
Per Dr Excell Seltzer, ok to titrate nitro gtt down to off and do it quickly due to resolved chest pain but unrelieved headache--patient given tylenol at 1230, and at 1530 still has unrelieved headache but chest pain is resolved--Dr Excell Seltzer also to order oxy for additional help with headache

## 2021-04-03 NOTE — Progress Notes (Signed)
SITE AREA: right groin/femoral  SITE PRIOR TO REMOVAL:  LEVEL 0  PRESSURE APPLIED FOR: approximately 20 minutes by Andrey Campanile A., RN (2H RN)  MANUAL: yes  PATIENT STATUS DURING PULL: stable  POST PULL SITE:  LEVEL 0  POST PULL INSTRUCTIONS GIVEN: yes  POST PULL PULSES PRESENT: bilateral pedal pulses at +2 palpable  DRESSING APPLIED: gauze with tegaderm  BEDREST BEGINS @ 1130  COMMENTS:

## 2021-04-03 NOTE — Plan of Care (Signed)
  Problem: Education: Goal: Knowledge of General Education information will improve Description: Including pain rating scale, medication(s)/side effects and non-pharmacologic comfort measures Outcome: Progressing   Problem: Health Behavior/Discharge Planning: Goal: Ability to manage health-related needs will improve Outcome: Progressing   Problem: Clinical Measurements: Goal: Ability to maintain clinical measurements within normal limits will improve Outcome: Progressing Goal: Will remain free from infection Outcome: Progressing Goal: Diagnostic test results will improve Outcome: Progressing Goal: Respiratory complications will improve Outcome: Progressing Goal: Cardiovascular complication will be avoided Outcome: Progressing   Problem: Activity: Goal: Risk for activity intolerance will decrease Outcome: Progressing   Problem: Nutrition: Goal: Adequate nutrition will be maintained Outcome: Progressing   Problem: Coping: Goal: Level of anxiety will decrease Outcome: Progressing   Problem: Elimination: Goal: Will not experience complications related to bowel motility Outcome: Progressing Goal: Will not experience complications related to urinary retention Outcome: Progressing   Problem: Pain Managment: Goal: General experience of comfort will improve Outcome: Progressing   Problem: Safety: Goal: Ability to remain free from injury will improve Outcome: Progressing   Problem: Skin Integrity: Goal: Risk for impaired skin integrity will decrease Outcome: Progressing   Problem: Education: Goal: Understanding of CV disease, CV risk reduction, and recovery process will improve Outcome: Progressing   Problem: Activity: Goal: Ability to return to baseline activity level will improve Outcome: Progressing   Problem: Cardiovascular: Goal: Ability to achieve and maintain adequate cardiovascular perfusion will improve Outcome: Progressing Goal: Vascular access site(s)  Level 0-1 will be maintained Outcome: Progressing   Problem: Health Behavior/Discharge Planning: Goal: Ability to safely manage health-related needs after discharge will improve Outcome: Progressing   

## 2021-04-03 NOTE — Progress Notes (Signed)
Echocardiogram 2D Echocardiogram has been performed.  Warren Lacy Rhaelyn Giron RDCS 04/03/2021, 9:26 AM

## 2021-04-04 ENCOUNTER — Other Ambulatory Visit: Payer: Self-pay | Admitting: Physician Assistant

## 2021-04-04 ENCOUNTER — Encounter (HOSPITAL_COMMUNITY): Payer: Self-pay | Admitting: Cardiovascular Disease

## 2021-04-04 DIAGNOSIS — I10 Essential (primary) hypertension: Secondary | ICD-10-CM

## 2021-04-04 DIAGNOSIS — E785 Hyperlipidemia, unspecified: Secondary | ICD-10-CM

## 2021-04-04 DIAGNOSIS — I2119 ST elevation (STEMI) myocardial infarction involving other coronary artery of inferior wall: Principal | ICD-10-CM

## 2021-04-04 HISTORY — DX: Hyperlipidemia, unspecified: E78.5

## 2021-04-04 LAB — BASIC METABOLIC PANEL
Anion gap: 9 (ref 5–15)
BUN: 14 mg/dL (ref 6–20)
CO2: 20 mmol/L — ABNORMAL LOW (ref 22–32)
Calcium: 8.3 mg/dL — ABNORMAL LOW (ref 8.9–10.3)
Chloride: 104 mmol/L (ref 98–111)
Creatinine, Ser: 0.9 mg/dL (ref 0.61–1.24)
GFR, Estimated: >60 mL/min (ref >60)
Glucose, Bld: 104 mg/dL — ABNORMAL HIGH (ref 70–99)
Potassium: 3.8 mmol/L (ref 3.5–5.1)
Sodium: 133 mmol/L — ABNORMAL LOW (ref 135–145)

## 2021-04-04 LAB — CBC
HCT: 45.9 % (ref 39.0–52.0)
Hemoglobin: 16 g/dL (ref 13.0–17.0)
MCH: 30.1 pg (ref 26.0–34.0)
MCHC: 34.9 g/dL (ref 30.0–36.0)
MCV: 86.4 fL (ref 80.0–100.0)
Platelets: 162 10*3/uL (ref 150–400)
RBC: 5.31 MIL/uL (ref 4.22–5.81)
RDW: 12.8 % (ref 11.5–15.5)
WBC: 10.7 10*3/uL — ABNORMAL HIGH (ref 4.0–10.5)
nRBC: 0 % (ref 0.0–0.2)

## 2021-04-04 MED ORDER — ATORVASTATIN CALCIUM 80 MG PO TABS: 80.0000 mg | ORAL_TABLET | Freq: Every day | ORAL | 11 refills | Status: DC

## 2021-04-04 MED ORDER — NICOTINE 14 MG/24HR TD PT24: 14.0000 mg | MEDICATED_PATCH | Freq: Every day | TRANSDERMAL | 0 refills | Status: DC

## 2021-04-04 MED ORDER — NITROGLYCERIN 0.4 MG SL SUBL: 0.4000 mg | SUBLINGUAL_TABLET | SUBLINGUAL | 12 refills | Status: AC | PRN

## 2021-04-04 MED ORDER — METOPROLOL TARTRATE 50 MG PO TABS: 50.0000 mg | ORAL_TABLET | Freq: Two times a day (BID) | ORAL | 11 refills | Status: DC

## 2021-04-04 MED ORDER — TICAGRELOR 90 MG PO TABS: 90.0000 mg | ORAL_TABLET | Freq: Two times a day (BID) | ORAL | 11 refills | Status: DC

## 2021-04-04 MED ORDER — LOSARTAN POTASSIUM 25 MG PO TABS
25.0000 mg | ORAL_TABLET | Freq: Every day | ORAL | 11 refills | Status: DC
Start: 2021-04-04 — End: 2021-04-10

## 2021-04-04 NOTE — Care Management (Signed)
New Brilinta, copay $15, left brochure w cards for nurse to provide to patient.

## 2021-04-04 NOTE — Progress Notes (Addendum)
CARDIAC REHAB PHASE I   PRE:  Rate/Rhythm: 67 SR  BP:  Sitting: 147/98   MODE:  Ambulation: 100 ft   POST:  Rate/Rhythm: 100 ST  BP:  Sitting: 131/102    Pt tolerated exercise fair and amb 100 ft independently. Pt denies CP or SOB throughout walk. Pt slightly dizzy as he as not been given meds yet. Nurse to give pt meds after walk. Pt to bed with call bell in reach. Reinforced ed with pt and wife. Stressed importance of ASA, Brilinta, statin, and NTG. Discussed stent, restrictions, site care, heart healthy diet, exercise guidelines, and smoking cessation. Will continue to follow.  [id number] Corey San, MS, ACSM-CEP 04/04/2021 8:11 AM

## 2021-04-04 NOTE — Discharge Instructions (Signed)
Aspirin and Ticagrelor (Brilinta) are blood thinners to keep the stents open.  Do not miss a dose of either aspirin or Brilinta.  Your hemoglobin A1c was elevated.  This test is used to manage and to diagnose diabetes.  The level was in the "pre-diabetic" range. Please schedule follow up with your primary care doctor to evaluate this.

## 2021-04-04 NOTE — Plan of Care (Signed)
  Problem: Education: Goal: Knowledge of General Education information will improve Description: Including pain rating scale, medication(s)/side effects and non-pharmacologic comfort measures Outcome: Completed/Met   Problem: Health Behavior/Discharge Planning: Goal: Ability to manage health-related needs will improve Outcome: Completed/Met   Problem: Clinical Measurements: Goal: Ability to maintain clinical measurements within normal limits will improve Outcome: Completed/Met Goal: Will remain free from infection Outcome: Completed/Met Goal: Diagnostic test results will improve Outcome: Completed/Met Goal: Respiratory complications will improve Outcome: Completed/Met Goal: Cardiovascular complication will be avoided Outcome: Completed/Met   Problem: Activity: Goal: Risk for activity intolerance will decrease Outcome: Completed/Met   Problem: Nutrition: Goal: Adequate nutrition will be maintained Outcome: Completed/Met   Problem: Coping: Goal: Level of anxiety will decrease Outcome: Completed/Met   Problem: Elimination: Goal: Will not experience complications related to bowel motility Outcome: Completed/Met Goal: Will not experience complications related to urinary retention Outcome: Completed/Met   Problem: Pain Managment: Goal: General experience of comfort will improve Outcome: Completed/Met   Problem: Safety: Goal: Ability to remain free from injury will improve Outcome: Completed/Met   Problem: Skin Integrity: Goal: Risk for impaired skin integrity will decrease Outcome: Completed/Met   Problem: Education: Goal: Understanding of CV disease, CV risk reduction, and recovery process will improve Outcome: Completed/Met Goal: Individualized Educational Video(s) Outcome: Completed/Met   Problem: Activity: Goal: Ability to return to baseline activity level will improve Outcome: Completed/Met   Problem: Cardiovascular: Goal: Ability to achieve and maintain  adequate cardiovascular perfusion will improve Outcome: Completed/Met Goal: Vascular access site(s) Level 0-1 will be maintained Outcome: Completed/Met   Problem: Health Behavior/Discharge Planning: Goal: Ability to safely manage health-related needs after discharge will improve Outcome: Completed/Met

## 2021-04-04 NOTE — Progress Notes (Signed)
Pt got discharged to home, discharge instructions provided and patient showed understanding to it, IV taken out,Telemonitor DC,pt left unit in wheelchair with all of the belongings accompanied with a girlfriend, Brilinta card is provided from Case Manager  Lonia Farber, RN

## 2021-04-04 NOTE — Progress Notes (Signed)
Progress Note  Patient Name: Corey Robinson Date of Encounter: 04/04/2021  Primary Cardiologist: New to [facility]  Subjective   No recurrent chest pain or shortness of breath.  Inpatient Medications    Scheduled Meds:  aspirin  81 mg Oral Daily   atorvastatin  80 mg Oral Daily   enoxaparin (LOVENOX) injection  40 mg Subcutaneous Q24H   metoprolol tartrate  50 mg Oral BID   nicotine  14 mg Transdermal Daily   sodium chloride flush  3 mL Intravenous Q12H   sodium chloride flush  3 mL Intravenous Q12H   sodium chloride flush  3 mL Intravenous Q12H   ticagrelor  90 mg Oral BID   Continuous Infusions:  sodium chloride     PRN Meds: sodium chloride, acetaminophen, acetaminophen, ALPRAZolam, fentaNYL (SUBLIMAZE) injection, labetalol, nitroGLYCERIN, ondansetron (ZOFRAN) IV, ondansetron (ZOFRAN) IV, oxyCODONE, oxyCODONE-acetaminophen, sodium chloride flush, sodium chloride flush   Vital Signs    Vitals:   04/03/21 2244 04/03/21 2300 04/04/21 0302 04/04/21 0740  BP: (!) 153/105 (!) 143/84 (!) 142/100 (!) 147/98  Pulse: 70 70 60 66  Resp: 18 18 18 14   Temp: 98.6 F (37 C)  98.7 F (37.1 C) 98.7 F (37.1 C)  TempSrc: Axillary  Axillary Oral  SpO2: 99%   99%  Weight:      Height:        Intake/Output Summary (Last 24 hours) at 04/04/2021 0957 Last data filed at 04/04/2021 0800 Gross per 24 hour  Intake 1889.36 ml  Output 900 ml  Net 989.36 ml   Filed Weights   04/01/21 1112 04/02/21 0600  Weight: 106.6 kg 100.6 kg    Telemetry    Sinus rhythm.  Personally reviewed.  ECG    An ECG dated 04/04/2021 was personally reviewed today and demonstrated:  Sinus rhythm with possible old inferior infarct pattern.  Physical Exam   GEN: No acute distress.   Neck: No JVD. Cardiac: RRR, no murmur, rub, or gallop.  Respiratory: Nonlabored. Clear to auscultation bilaterally. GI: Soft, nontender, bowel sounds present. MS: No edema; No deformity. Neuro:  Nonfocal. Psych:  Alert and oriented x 3. Normal affect.  Labs    Chemistry Recent Labs  Lab 04/01/21 1100 04/02/21 0134 04/04/21 0059  NA 134* 136 133*  K 3.9 4.4 3.8  CL 101 107 104  CO2 21* 20* 20*  GLUCOSE 109* 97 104*  BUN 13 12 14   CREATININE 0.95 0.91 0.90  CALCIUM 9.0 8.3* 8.3*  GFRNONAA >60 >60 >60  ANIONGAP 12 9 9      Hematology Recent Labs  Lab 04/01/21 1100 04/02/21 0134 04/04/21 0059  WBC 7.2 9.3 10.7*  RBC 5.86* 5.33 5.31  HGB 17.6* 15.8 16.0  HCT 52.6* 47.3 45.9  MCV 89.8 88.7 86.4  MCH 30.0 29.6 30.1  MCHC 33.5 33.4 34.9  RDW 12.9 13.2 12.8  PLT 182 172 162    Cardiac Enzymes Recent Labs  Lab 04/01/21 1300 04/01/21 1623 04/01/21 1855 04/03/21 1744 04/03/21 2003  TROPONINIHS 88* 903* 1,205* 755* 149*    DDimer Recent Labs  Lab 04/01/21 1100  DDIMER <0.27     Radiology    CARDIAC CATHETERIZATION  Result Date: 04/03/2021   Prox LAD to Mid LAD lesion is 20% stenosed.   Prox RCA-2 lesion is 70% stenosed.   Prox RCA-1 lesion is 60% stenosed.   Non-stenotic 1st Mrg lesion was previously treated.   Non-stenotic RV Branch lesion.   A stent was successfully placed.  Post intervention, there is a 0% residual stenosis.   Post intervention, there is a 0% residual stenosis.   ECHOCARDIOGRAM LIMITED  Result Date: 04/03/2021    ECHOCARDIOGRAM LIMITED REPORT   Patient Name:   Corey Robinson Date of Exam: 04/03/2021 Medical Rec #:  [id number]        Height:       71.0 in Accession #:    [id number]       Weight:       221.8 lb Date of Birth:  Aug 06, 1975         BSA:          2.203 m Patient Age:    45 years         BP:           154/104 mmHg Patient Gender: M                HR:           65 bpm. Exam Location:  Inpatient Procedure: Limited Echo Indications:    CAD Native Vessel i25.10  History:        Patient has prior history of Echocardiogram examinations, most                 recent 04/01/2021. CAD; Risk Factors:Diabetes.  Sonographer:    Irving Burton Senior RDCS Referring  Phys: [phone] MICHAEL COOPER  Sonographer Comments: Limited to check anterior wall motion for Dr. Katrinka Blazing on cath table  Conclusion(s)/Recommendation(s): LV function is normal. Walls are not all well visualized, grossly does not appear to have any regional wall motion abnormalities. Carolan Clines Electronically signed by Carolan Clines Signature Date/Time: 04/03/2021/1:31:45 PM    Final     Assessment & Plan    1.  Inferolateral STEMI status post DES to the first obtuse marginal on November 9.  Peak high-sensitivity troponin I 1205.  LVEF 55 to 60%.  2.  Residual CAD involving the RCA status post staged DES intervention to the RCA on November 11.  3.  Mixed hyperlipidemia with LDL 130.  4.  Prediabetes with hemoglobin A1c 6.1%.  5.  History of tobacco abuse.  6.  Essential hypertension, not optimally controlled.  No recurrent chest pain.  Spoke with patient and wife today, we will plan on discharge home with close outpatient follow-up.  He would like to see Dr. Excell Seltzer, please schedule follow-up with him or APP in the next 7 to 10 days.  Continue aspirin, Brilinta, Lipitor, and Lopressor.  Would also start Cozaar 25 mg daily, he will check blood pressure at home and these measurements can be reviewed at office follow-up.  He will need a BMET at follow-up as well.  Further therapy can be titrated from there (uptitration or possibly SGLT2 inhibitor).  Signed, Nona Dell, MD  04/04/2021, 9:57 AM

## 2021-04-04 NOTE — Discharge Summary (Addendum)
Discharge Summary    Patient ID: Corey Robinson MRN: [id number]; DOB: May 22, 1976  Admit date: 04/01/2021 Discharge date: 04/04/2021  PCP:  Corey Claude, MD   Corey Robinson Providers Cardiologist:  Corey Bollman, MD        Discharge Diagnoses    Principal Problem:   Acute ST elevation myocardial infarction (STEMI) of inferolateral wall Corey Robinson) Active Problems:   Pre-diabetes   CAD (coronary artery disease)   HLD (hyperlipidemia)   HTN (hypertension)    Diagnostic Studies/Procedures    Echocardiogram 04/01/21 1. Left ventricular ejection fraction, by estimation, is 55 to 60%. The  left ventricle has normal function. Left ventricular endocardial border  not optimally defined to evaluate regional wall motion though no definite  wall motion abnormalities noted.  Left ventricular diastolic parameters are consistent with Grade I  diastolic dysfunction (impaired relaxation).  2. Right ventricular systolic function is normal. The right ventricular  size is normal. Tricuspid regurgitation signal is inadequate for assessing  PA pressure.  3. The mitral valve is normal in structure. No evidence of mitral valve  regurgitation. No evidence of mitral stenosis.  4. The aortic valve is tricuspid. Aortic valve regurgitation is not  visualized. No aortic stenosis is present.  5. Technically difficult study with poor acoustic windows.    LEFT HEART CATH AND CORONARY ANGIOGRAPHY, LEFT HEART CATH AND CORONARY ANGIOGRAPHY 04/01/2021 Narrative   Prox RCA-1 lesion is 60% stenosed.   Prox RCA-2 lesion is 70% stenosed.   1st Mrg lesion is 99% stenosed.   Prox LAD to Mid LAD lesion is 20% stenosed.   A drug-eluting stent was successfully placed using a STENT ONYX FRONTIER 2.5X18.   Post intervention, there is a 0% residual stenosis.  Acute inferolateral STEMI secondary to sub-total occlusion of the first obtuse marginal branch. Successful PTCA/DES x 1 first obtuse marginal branch Mild  plaque in the mid LAD Severe proximal RCA stenosis. The RCA is a dominant vessel.  Recommendations: Will admit to the ICU. DAPT with ASA/Brilinta for one year. Aggrastat drip for two hours post PCI. Will start high intensity statin and a low dose beta blocker. Echo in the am. He will need staged PCI of the RCA prior to discharge.     CORONARY STENT INTERVENTION, CORONARY STENT INTERVENTION 04/03/2021 Narrative   Prox LAD to Mid LAD lesion is 20% stenosed.   Prox RCA-2 lesion is 70% stenosed.   Prox RCA-1 lesion is 60% stenosed.   Non-stenotic 1st Mrg lesion was previously treated.   Non-stenotic RV Branch lesion.   A stent was successfully placed.   Post intervention, there is a 0% residual stenosis.   Post intervention, there is a 0% residual stenosis.     Limited echocardiogram 04/03/21 Conclusion(s)/Recommendation(s): LV function is normal. Walls are not all well visualized, grossly does not appear to have any regional wall motion abnormalities.  _____________   History of Present Illness     Corey Robinson is a 45 y.o. male smoker with no real significant PMH who presented to the ED on the day of admission with chest pain.  While in the ED he developed severe 10/10 chest pain with inferolateral ST elevation.  A code STEMI was called.  He was given ASA 324 mg and 4000 of UFH.  He was taken emergently to the cardiac catheterization lab.    Hospital Course     Consultants: none    As noted, he was admitted and taken emergently to the cardiac catheterization lab.  Cardiac catheterization demonstrated a subtotally occluded OM1 which was treated with a DES.  He was noted to have severe proximal RCA stenosis in a dominant RCA which would require staged PCI prior to DC.  An echocardiogram demonstrated normal LVF but was not adequate to evaluate wall motion.  His hs-Trop peaked at 1205.  He went back to the cath lab on 04/03/21 for staged PCI of the RCA.  He underwent placement of DES x 1  to the pRCA.  This was c/b occlusion of a small to mod sized RV branch.  Wire rescue of the RV branch was attempted but did not improve flow.  The pt had ongoing chest pain and anterior ST elevation.  Dr. Katrinka Blazing performed a relook diagnostic cath to evaluate the left system via a R femoral approach. This demonstrated a widely patent LAD, LM and LCx.  The OM1 stent was patent.  The RCA was patent post PCI.  The mod sized RV branch remained totally occluded.  A limited echocardiogram was obtained and demonstrated normal EF.  Wall motion was difficult to visualize but, grossly, there did not appear to be any wall motion abnormalities.  Later that day, his chest pain had resolved and he was weaned off of IV NTG.  The pharmacy team did note that his copay for Ticagrelor would be $15 for a 30 day supply.  He was seen by cardiac rehabilitation this AM and was able to ambulate 100 ft without chest pain.  He was evaluated by Dr. Diona Browner this AM.  He has not had recurrent chest pain.  He is felt to be stable for DC to home.  He will need TOC follow up in 7-10 days.  His BP remains elevated and he will be started on Losartan 25 mg once daily at DC.  He was asked to track his BPs at home and bring those readings to his f/u appt.  He will need a BMET at f/u.  Of note, his A1c was 6.1.  He will need f/u with his PCP for new dx of pre-diabetes.  SGLT2i could be considered at f/u.    Did the patient have an acute coronary syndrome (MI, NSTEMI, STEMI, etc) this admission?:  Yes                               AHA/ACC Clinical Performance & Quality Measures: Aspirin prescribed? - Yes ADP Receptor Inhibitor (Plavix/Clopidogrel, Brilinta/Ticagrelor or Effient/Prasugrel) prescribed (includes medically managed patients)? - Yes Beta Blocker prescribed? - Yes High Intensity Statin (Lipitor 40-80mg  or Crestor 20-40mg ) prescribed? - Yes EF assessed during THIS hospitalization? - Yes For EF <40%, was ACEI/ARB prescribed? - Yes For EF  <40%, Aldosterone Antagonist (Spironolactone or Eplerenone) prescribed? - Not Applicable (EF >/= 40%) Cardiac Rehab Phase II ordered (including medically managed patients)? - Yes   The patient will be scheduled for a TOC follow up appointment in 7-10 days.  A message has been sent to the [facility] and Scheduling Pool at the office where the patient should be seen for follow up.  _____________  Discharge Vitals Blood pressure (!) 147/98, pulse 66, temperature 98.7 F (37.1 C), temperature source Oral, resp. rate 14, height 5\' 11"  (1.803 m), weight 100.6 kg, SpO2 99 %.  Filed Weights   04/01/21 1112 04/02/21 0600  Weight: 106.6 kg 100.6 kg    Labs & Radiologic Studies    CBC Recent Labs    04/02/21  0134 04/04/21 0059  WBC 9.3 10.7*  HGB 15.8 16.0  HCT 47.3 45.9  MCV 88.7 86.4  PLT 172 162   Basic Metabolic Panel Recent Labs    97/98/92 0134 04/04/21 0059  NA 136 133*  K 4.4 3.8  CL 107 104  CO2 20* 20*  GLUCOSE 97 104*  BUN 12 14  CREATININE 0.91 0.90  CALCIUM 8.3* 8.3*    High Sensitivity Troponin:   Recent Labs  Lab 04/01/21 1300 04/01/21 1623 04/01/21 1855 04/03/21 1744 04/03/21 2003  TROPONINIHS 88* 903* 1,205* 755* 149*    Hemoglobin A1C Recent Labs    04/01/21 1402  HGBA1C 6.1*   Fasting Lipid Panel Recent Labs    04/02/21 0134  CHOL 213*  HDL 27*  LDLCALC 130*  TRIG 278*  CHOLHDL 7.9   SARS Coronavirus 2 by RT PCR  Date/Time Value Ref Range Status  04/01/2021 12:01 PM NEGATIVE NEGATIVE Final    Comment:    (NOTE) SARS-CoV-2 target nucleic acids are NOT DETECTED.  The SARS-CoV-2 RNA is generally detectable in upper respiratory specimens during the acute phase of infection. The lowest concentration of SARS-CoV-2 viral copies this assay can detect is 138 copies/mL. A negative result does not preclude SARS-Cov-2 infection and should not be used as the sole basis for treatment or other patient management decisions. A negative result may  occur with  improper specimen collection/handling, submission of specimen other than nasopharyngeal swab, presence of viral mutation(s) within the areas targeted by this assay, and inadequate number of viral copies(<138 copies/mL). A negative result must be combined with clinical observations, patient history, and epidemiological information. The expected result is Negative.  Fact Sheet for Patients:  BloggerCourse.com  Fact Sheet for Healthcare Providers:  SeriousBroker.it  This test is not yet approved or cleared by the [place] FDA and  has been authorized for detection and/or diagnosis of SARS-CoV-2 by FDA under an Emergency Use Authorization (EUA). This EUA will remain  in effect (meaning this test can be used) for the duration of the COVID-19 declaration under Section 564(b)(1) of the Act, 21 U.S.C.section 360bbb-3(b)(1), unless the authorization is terminated  or revoked sooner.       Influenza A by PCR  Date/Time Value Ref Range Status  04/01/2021 12:01 PM POSITIVE (A) NEGATIVE Final   Influenza B by PCR  Date/Time Value Ref Range Status  04/01/2021 12:01 PM NEGATIVE NEGATIVE Final    Comment:    (NOTE) The Xpert Xpress SARS-CoV-2/FLU/RSV plus assay is intended as an aid in the diagnosis of influenza from Nasopharyngeal swab specimens and should not be used as a sole basis for treatment. Nasal washings and aspirates are unacceptable for Xpert Xpress SARS-CoV-2/FLU/RSV testing.  Fact Sheet for Patients: BloggerCourse.com  Fact Sheet for Healthcare Providers: SeriousBroker.it  This test is not yet approved or cleared by the [place] FDA and has been authorized for detection and/or diagnosis of SARS-CoV-2 by FDA under an Emergency Use Authorization (EUA). This EUA will remain in effect (meaning this test can be used) for the duration of the COVID-19  declaration under Section 564(b)(1) of the Act, 21 U.S.C. section 360bbb-3(b)(1), unless the authorization is terminated or revoked.  Performed at [facility], [address] [place], [place] [address]     _____________   DG Chest Port 1 View  Result Date: 04/01/2021 CLINICAL DATA:  Chest pain EXAM: PORTABLE CHEST 1 VIEW COMPARISON:  None. FINDINGS: The heart size and mediastinal contours are within normal  limits. Both lungs are clear. The visualized skeletal structures are unremarkable.  IMPRESSION: No active disease. Electronically Signed   By: Larose Hires D.O.   On: 04/01/2021 11:44      Disposition   Pt is being discharged home today in good condition.  Follow-up Plans & Appointments     Follow-up Information     Beatrice Lecher, PA-C Follow up on 04/10/2021.   Specialties: Cardiology, Physician Assistant Why: Lorin Picket is a PA who works with Dr. Excell Seltzer.  Your appointment is at 9:40 am Contact information: [address] [place] [address] [phone]         Corey Claude, MD Follow up in 1 week(s).   Specialty: Family Medicine Why: Please call for an appointment. Contact information: [address] [place] [address] [phone]         Corey Bollman, MD .   Specialty: Cardiology Contact information: [phone] [address] [place] [address] [phone]                Discharge Instructions     Amb Referral to Cardiac Rehabilitation   Complete by: As directed    Diagnosis:  Coronary Stents STEMI     After initial evaluation and assessments completed: Virtual Based Care may be provided alone or in conjunction with Phase 2 Cardiac Rehab based on patient barriers.: Yes   Diet - low sodium heart healthy   Complete by: As directed    Discharge wound care:   Complete by: As directed    Call [phone] for swelling, bleeding, bruising or fever.   Driving Restrictions   Complete by: As directed     No driving until seen at f/u visit   Increase activity slowly   Complete by: As directed    Lifting restrictions   Complete by: As directed    No lifting over 5 lbs for 2 weeks.   Sexual Activity Restrictions   Complete by: As directed    None for 2 weeks.       Discharge Medications   Allergies as of 04/04/2021   No Known Allergies      Medication List     STOP taking these medications    DAYQUIL PO       TAKE these medications    acetaminophen 500 MG tablet Commonly known as: TYLENOL Take 1,000 mg by mouth every 6 (six) hours as needed for mild pain, headache or fever.   aspirin EC 81 MG tablet Take 81 mg by mouth daily. Swallow whole.   atorvastatin 80 MG tablet Commonly known as: LIPITOR Take 1 tablet (80 mg total) by mouth daily. Start taking on: April 05, 2021   FISH OIL PO Take 1 capsule by mouth daily.   losartan 25 MG tablet Commonly known as: COZAAR Take 1 tablet (25 mg total) by mouth daily.   metoprolol tartrate 50 MG tablet Commonly known as: LOPRESSOR Take 1 tablet (50 mg total) by mouth 2 (two) times daily.   nicotine 14 mg/24hr patch Commonly known as: NICODERM CQ - dosed in mg/24 hours Place 1 patch (14 mg total) onto the skin daily. Start taking on: April 05, 2021   nitroGLYCERIN 0.4 MG SL tablet Commonly known as: NITROSTAT Place 1 tablet (0.4 mg total) under the tongue every 5 (five) minutes as needed for chest pain.   ticagrelor 90 MG Tabs tablet Commonly known as: BRILINTA Take 1 tablet (90 mg total) by mouth 2 (two)  times daily.               Discharge Care Instructions  (From admission, onward)           Start     Ordered   04/04/21 0000  Discharge wound care:       Comments: Call [phone] for swelling, bleeding, bruising or fever.   04/04/21 1220               Outstanding Labs/Studies   BMET at f/u OV  Duration of Discharge Encounter   Greater than 30 minutes including physician  time.  Signed, Tereso Newcomer, PA-C 04/04/2021, 12:20 PM

## 2021-04-06 ENCOUNTER — Telehealth: Payer: Self-pay

## 2021-04-06 LAB — POCT ACTIVATED CLOTTING TIME: Activated Clotting Time: 155 seconds

## 2021-04-06 NOTE — Telephone Encounter (Signed)
**Note De-identified Roopa Graver Obfuscation** -----  **Note De-Identified Maritssa Haughton Obfuscation** Message from Beatrice Lecher, [place] sent at 04/04/2021 12:07 PM EST ----- Regarding: Post Hospital F/U Primary Cardiologist:  Excell Seltzer DC from hospital on 04/04/2021  Pt has f/u with me on 11/18.  Please call to confirm appt and confirm f/u questions for TOC. This is a TCM appointment. Signed,  Tereso Newcomer, PA-C   04/04/2021 12:07 PM

## 2021-04-06 NOTE — Telephone Encounter (Deleted)
**Note De-identified Trampus Mcquerry Obfuscation** -----  **Note De-Identified Keasia Dubose Obfuscation** Message from Beatrice Lecher, [place] sent at 04/04/2021 12:07 PM EST ----- Regarding: Post Hospital F/U Primary Cardiologist:  Excell Seltzer DC from hospital on 04/04/2021  Pt has f/u with me on 11/18.  Please call to confirm appt and confirm f/u questions for TOC. This is a TCM appointment. Signed,  Tereso Newcomer, PA-C   04/04/2021 12:07 PM

## 2021-04-06 NOTE — Telephone Encounter (Signed)
**Note De-Identified Adna Nofziger Obfuscation** Patient contacted regarding discharge from [facility] on 04/04/2021.  Patient understands to follow up with provider Tereso Newcomer, PA-c on 04/10/2021 at 9:40 at [address] in [place], [place] [address].  Patient understands discharge instructions? yes Patient understands medications and regiment? Yes Patient understands to bring all medications to this visit? Yes  Ask patient:  Are you enrolled in My Chart: No he has not. I did provide him with the web site ([email or web address]) so he can create an account.  The pt states he is doing well this morning and without c/o CP/discomfort, SOB, nausea, diaphoresis, headache, or fatigue but does state that if he stands up to quickly he does get dizzy at times. I did advise him to stand up slowly and to be sure he has something to hold onto for balance as he is standing up. He verbalized understanding to all information given and thanked me for calling him.

## 2021-04-08 ENCOUNTER — Telehealth: Payer: Self-pay

## 2021-04-08 NOTE — Telephone Encounter (Signed)
Transition Care Management Unsuccessful Follow-up Telephone Call  Date of discharge and from where:  04/04/2021 / [facility]   Attempts:  1st Attempt  Reason for unsuccessful TCM follow-up call:  Unable to leave message.  Message states voice mail box has not been set up.  George Ina RN,BSN,CCM RN Case Manager [facility] [phone]

## 2021-04-08 NOTE — Progress Notes (Signed)
Cardiology Office Note:    Date:  04/10/2021   ID:  Corey Robinson, DOB December 19, 1975, MRN [id number]  PCP:  Corey Claude, MD   [facility] Providers Cardiologist:  Corey Bollman, MD    Referring MD: Corey Claude, MD   Chief Complaint:  Hospitalization Follow-up (S/p inferolateral STEMI treated with PCI; TOC follow-up)    Patient Profile:   Corey Robinson is a 45 y.o. male with:  Coronary artery disease  Inf-lat STEMI s/p DES to OM1 Staged PCI: DES to RCA; RV br occluded Echocardiogram 11/22: EF 55-60 Hypertension  Hyperlipidemia  Pre-diabetes   History of Present Illness: Corey Robinson was recently admitted 11/9-11/12 with an inferolateral STEMI.  Cardiac catheterization demonstrated a subtotally occluded OM1 which was tx with a DES.  He had residual severe stenosis in the proximal RCA.  He returned to the cath lab on 04/03/21 for staged PCI of the RCA with a DES. This was c/b RV branch occlusion.  Wire rescue was attempted but did not improve flow.  He had ongoing post PCI pain and ST elevation.  He was taken back to the cath lab for a relook cath that demonstrated patent stents in the OM1 and RCA.  The LM, LAD, LCx were patent as well.  EF was normal on echocardiogram.  He was DC on ASA, Ticagrelor, high intensity statin, metoprolol tartrate and Losartan.  He returns for f/u.  He is here today with his wife.  Over the last several days, he has developed a rash over his chest, abdomen, back, arms and upper thighs.  It is pruritic.  He has been taking Benadryl without much relief.  He has not had any lip swelling, tongue swelling or difficulty breathing.  He thought it may have been from the nicotine patch.  He has not had chest pain, shortness of breath, syncope, orthopnea, leg edema.     ASSESSMENT & PLAN:   Acute ST elevation myocardial infarction (STEMI) of inferolateral wall (HCC) Status post recent inferolateral STEMI treated with DES to the OM1 and staged intervention with  DES to the RCA.  His second intervention was complicated by RV branch occlusion and ongoing chest pain.  Relook catheterization demonstrated patent stents and patent LAD.  EF has been preserved on echocardiogram.  He does have a diffuse maculopapular rash that seems to be due to a drug eruption.  I reviewed his medications with our clinical pharmacist.  Ticagrelor seems to be unlikely to cause his current allergic reaction.  Continue aspirin 81 mg daily, ticagrelor 90 mg twice daily.  I will hold his atorvastatin for now.  I will stop his metoprolol tartrate and place him on carvedilol 6.25 mg twice daily.  We will hold his losartan.  He is ready to start cardiac rehabilitation.  I will see him back in the next couple weeks to start resuming some of his medications.  Drug eruption As noted, I reviewed his case today with our clinical pharmacist.  The most likely culprits are metoprolol tartrate and losartan to cause his current rash.  It is unlikely that his rash is related to ticagrelor.  All of his medications are new.  I will go ahead and hold his atorvastatin as outlined.  I will likely resume this when I see him back in the next week or 2.  I have asked him to continue on Benadryl 25 mg every 6 hours as needed for itch.  I have given him betamethasone 0.05% to use 1-2 times  a day.  His rash is fairly diffuse.  I will also send in a prescription for prednisone 60 mg daily for 5 days.  If he has difficulty using the topical steroid, he can get the prednisone filled.  He knows to contact us or go to the emergency room if his symptoms worsen.  Pre-diabetes He will need follow-up with primary care for ongoing management.  HTN (hypertension) Blood pressures have been well controlled.  As noted, we are stopping losartan.  We could consider rechallenge in him with an ACE inhibitor once his rash is resolved.  I am also stopping his metoprolol tartrate and replacing that with carvedilol 6.25 mg twice daily.  If  his blood pressure starts to creep up, consider increasing carvedilol to 12.5 mg twice daily.  HLD (hyperlipidemia) As noted, I am holding his atorvastatin due to his rash.  Hopefully I will resume this at his follow-up in a couple weeks.  As long as he can tolerate the atorvastatin, we will recheck lipids and LFTs in 3 months.     Cardiac Rehabilitation Eligibility Assessment  The patient is ready to start cardiac rehabilitation from a cardiac standpoint.      Dispo:  Return in about 2 weeks (around 04/24/2021) for Routine follow up in 2 weeks with Corey Newcomer, PA-C.Marland Kitchen    Prior CV studies: Echocardiogram 04/01/21 EF 55-60, no def WMA, Gr 1 DD, normal RVSF  Cardiac catheterization 04/01/21 LAD prox to mid 20 OM1 99 RCA prox 60, 70 PCI: 2.5 x 18 mm Onyx Frontier DES to OM1  Cardiac catheterization 04/03/21 LAD prox to mid 20 OM1 stent patent RCA prox 60. 70  PCI:  3.5 x 48 mm Synergy DES to RCA  C/b RV branch occlusion  Limited echocardiogram 04/03/21 Normal LVF, no RWMA    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 - Cath: mLAD 20, pRCA 60/70, OM1 99 >> 2.5 x 18 mm Onyx Frontier DES to OM1 // staged PCI 04/03/21: 48 x 3.5 Synergy DES to pRCA; c/b RV branch occlusion and ongoing chest pain, ant STE >> relook cath with patent stents, patent LAD, LM // Echocardiogram 11/22: EF 55-60, no RWMA (difficult windows), Gr 1 DD, normal RVSF // Limited echocardiogram after staged PCI: normal EF and   HLD (hyperlipidemia) 04/04/2021   HTN (hypertension)    Polycythemia    Pre-diabetes 01/04/2017   Current Medications: Current Meds  Medication Sig   acetaminophen (TYLENOL) 500 MG tablet Take 1,000 mg by mouth every 6 (six) hours as needed for mild pain, headache or fever.   aspirin EC 81 MG tablet Take 81 mg by mouth daily. Swallow whole.   betamethasone dipropionate 0.05 % cream 2 times daily per day X 1 week.   carvedilol  (COREG) 6.25 MG tablet Take 1 tablet (6.25 mg total) by mouth 2 (two) times daily.   diphenhydrAMINE (BENADRYL ALLERGY) 25 mg capsule Take 1 capsule (25 mg total) by mouth every 6 (six) hours as needed.   nitroGLYCERIN (NITROSTAT) 0.4 MG SL tablet Place 1 tablet (0.4 mg total) under the tongue every 5 (five) minutes as needed for chest pain.   Omega-3 Fatty Acids (FISH OIL PO) Take 1 capsule by mouth daily.   predniSONE (DELTASONE) 20 MG tablet Take 3 tablets (60 mg total) by mouth daily with breakfast. For 5 days only.   ticagrelor (BRILINTA) 90 MG TABS tablet Take 1 tablet (90 mg total) by  mouth 2 (two) times daily.   [DISCONTINUED] atorvastatin (LIPITOR) 80 MG tablet Take 1 tablet (80 mg total) by mouth daily.   [DISCONTINUED] losartan (COZAAR) 25 MG tablet Take 1 tablet (25 mg total) by mouth daily.   [DISCONTINUED] metoprolol tartrate (LOPRESSOR) 50 MG tablet Take 1 tablet (50 mg total) by mouth 2 (two) times daily.    Allergies:   Patient has no known allergies.   Social History   Tobacco Use   Smoking status: Every Day   Smokeless tobacco: Current    Family Hx: The patient's family history is not on file.  Review of Systems  Skin:  Positive for rash.    EKGs/Labs/Other Test Reviewed:    EKG:  EKG is  ordered today.  The ekg ordered today demonstrates NSR, HR 71, inferior Q waves, QTC 397, no ST-T wave changes  Recent Labs: 04/04/2021: BUN 14; Creatinine, Ser 0.90; Hemoglobin 16.0; Platelets 162; Potassium 3.8; Sodium 133   Recent Lipid Panel Lab Results  Component Value Date/Time   CHOL 213 (H) 04/02/2021 01:34 AM   TRIG 278 (H) 04/02/2021 01:34 AM   HDL 27 (L) 04/02/2021 01:34 AM   LDLCALC 130 (H) 04/02/2021 01:34 AM     Risk Assessment/Calculations:          Physical Exam:    VS:  BP 128/72   Pulse 71   Ht 5\' 11"  (1.803 m)   Wt 228 lb 12.8 oz (103.8 kg)   SpO2 97%   BMI 31.91 kg/m     Wt Readings from Last 3 Encounters:  04/10/21 228 lb 12.8 oz (103.8  kg)  04/02/21 221 lb 12.5 oz (100.6 kg)  02/16/17 242 lb (109.8 kg)    Constitutional:      Appearance: Healthy appearance. Not in distress.  Neck:     Vascular: JVD normal.  Pulmonary:     Effort: Pulmonary effort is normal.     Breath sounds: No wheezing. No rales.  Cardiovascular:     Normal rate. Regular rhythm. Normal S1. Normal S2.      Murmurs: There is no murmur.     Comments: Right femoral arteriotomy site without bruit Edema:    Peripheral edema absent.  Abdominal:     Palpations: Abdomen is soft.  Skin:    General: Skin is warm and dry.     Findings: Rash (diffuse maculopapular rash on chest, abdomen, back, upper arms, upper neck and upper thighs) present.  Neurological:     General: No focal deficit present.     Mental Status: Alert and oriented to person, place and time.     Cranial Nerves: Cranial nerves are intact.       Medication Adjustments/Labs and Tests Ordered: Current medicines are reviewed at length with the patient today.  Concerns regarding medicines are outlined above.  Tests Ordered: Orders Placed This Encounter  Procedures   EKG 12-Lead   Medication Changes: Meds ordered this encounter  Medications   betamethasone dipropionate 0.05 % cream    Sig: 2 times daily per day X 1 week.    Dispense:  30 g    Refill:  0   predniSONE (DELTASONE) 20 MG tablet    Sig: Take 3 tablets (60 mg total) by mouth daily with breakfast. For 5 days only.    Dispense:  15 tablet    Refill:  0   diphenhydrAMINE (BENADRYL ALLERGY) 25 mg capsule    Sig: Take 1 capsule (25 mg total) by mouth every  6 (six) hours as needed.    Dispense:  30 capsule    Refill:  0   carvedilol (COREG) 6.25 MG tablet    Sig: Take 1 tablet (6.25 mg total) by mouth 2 (two) times daily.    Dispense:  60 tablet    Refill:  3   Signed, Corey Newcomer, PA-C  04/10/2021 12:28 PM    [facility] [address] [place], [place], [place]  [address] Phone: [phone]; Fax:  [phone]

## 2021-04-10 ENCOUNTER — Ambulatory Visit: Payer: BLUE CROSS/BLUE SHIELD | Admitting: Physician Assistant

## 2021-04-10 ENCOUNTER — Other Ambulatory Visit: Payer: Self-pay

## 2021-04-10 ENCOUNTER — Other Ambulatory Visit: Payer: BLUE CROSS/BLUE SHIELD | Admitting: *Deleted

## 2021-04-10 ENCOUNTER — Encounter: Payer: Self-pay | Admitting: Physician Assistant

## 2021-04-10 VITALS — BP 128/72 | HR 71 | Ht 71.0 in | Wt 228.8 lb

## 2021-04-10 DIAGNOSIS — I2119 ST elevation (STEMI) myocardial infarction involving other coronary artery of inferior wall: Secondary | ICD-10-CM

## 2021-04-10 DIAGNOSIS — I10 Essential (primary) hypertension: Secondary | ICD-10-CM | POA: Diagnosis not present

## 2021-04-10 DIAGNOSIS — L270 Generalized skin eruption due to drugs and medicaments taken internally: Secondary | ICD-10-CM

## 2021-04-10 DIAGNOSIS — R7303 Prediabetes: Secondary | ICD-10-CM

## 2021-04-10 DIAGNOSIS — E782 Mixed hyperlipidemia: Secondary | ICD-10-CM

## 2021-04-10 HISTORY — DX: Generalized skin eruption due to drugs and medicaments taken internally: L27.0

## 2021-04-10 LAB — BASIC METABOLIC PANEL
BUN/Creatinine Ratio: 10 (ref 9–20)
BUN: 12 mg/dL (ref 6–24)
CO2: 25 mmol/L (ref 20–29)
Calcium: 9.2 mg/dL (ref 8.7–10.2)
Chloride: 99 mmol/L (ref 96–106)
Creatinine, Ser: 1.19 mg/dL (ref 0.76–1.27)
Glucose: 80 mg/dL (ref 70–99)
Potassium: 4.9 mmol/L (ref 3.5–5.2)
Sodium: 138 mmol/L (ref 134–144)
eGFR: 77 mL/min/{1.73_m2} (ref >59)

## 2021-04-10 MED ORDER — DIPHENHYDRAMINE HCL 25 MG PO CAPS
25.0000 mg | ORAL_CAPSULE | Freq: Four times a day (QID) | ORAL | 0 refills | Status: AC | PRN
Start: 2021-04-10 — End: ?

## 2021-04-10 MED ORDER — CARVEDILOL 6.25 MG PO TABS: 6.2500 mg | ORAL_TABLET | Freq: Two times a day (BID) | ORAL | 3 refills | Status: DC

## 2021-04-10 MED ORDER — PREDNISONE 20 MG PO TABS: 60.0000 mg | ORAL_TABLET | Freq: Every day | ORAL | 0 refills | Status: DC

## 2021-04-10 MED ORDER — BETAMETHASONE DIPROPIONATE 0.05 % EX CREA: TOPICAL_CREAM | CUTANEOUS | 0 refills | Status: DC

## 2021-04-10 NOTE — Assessment & Plan Note (Signed)
As noted, I reviewed his case today with our clinical pharmacist.  The most likely culprits are metoprolol tartrate and losartan to cause his current rash.  It is unlikely that his rash is related to ticagrelor.  All of his medications are new.  I will go ahead and hold his atorvastatin as outlined.  I will likely resume this when I see him back in the next week or 2.  I have asked him to continue on Benadryl 25 mg every 6 hours as needed for itch.  I have given him betamethasone 0.05% to use 1-2 times a day.  His rash is fairly diffuse.  I will also send in a prescription for prednisone 60 mg daily for 5 days.  If he has difficulty using the topical steroid, he can get the prednisone filled.  He knows to contact us or go to the emergency room if his symptoms worsen.

## 2021-04-10 NOTE — Assessment & Plan Note (Signed)
Status post recent inferolateral STEMI treated with DES to the OM1 and staged intervention with DES to the RCA.  His second intervention was complicated by RV branch occlusion and ongoing chest pain.  Relook catheterization demonstrated patent stents and patent LAD.  EF has been preserved on echocardiogram.  He does have a diffuse maculopapular rash that seems to be due to a drug eruption.  I reviewed his medications with our clinical pharmacist.  Ticagrelor seems to be unlikely to cause his current allergic reaction.  Continue aspirin 81 mg daily, ticagrelor 90 mg twice daily.  I will hold his atorvastatin for now.  I will stop his metoprolol tartrate and place him on carvedilol 6.25 mg twice daily.  We will hold his losartan.  He is ready to start cardiac rehabilitation.  I will see him back in the next couple weeks to start resuming some of his medications.

## 2021-04-10 NOTE — Patient Instructions (Signed)
Medication Instructions:   DISCONTINUE LOSARTAN, LIPITOR, AND METOPROLOL  START Carvedilol one tablet by mouth ( 6.25 mg ) twice daily  START betamethasone creme 1-2 X per day X 1-2 days if no relief start PREDNISONE 3 tablets by mouth ( 60 mg) every day X 5 days.  START Benadryl one tablet by mouth ( 25 mg ) every 6 hours as needed.   *If you need a refill on your cardiac medications before your next appointment, please call your pharmacy*   Lab Work:  TODAY!!!  BMET  If you have labs (blood work) drawn today and your tests are completely normal, you will receive your results only by: MyChart Message (if you have MyChart) OR A paper copy in the mail If you have any lab test that is abnormal or we need to change your treatment, we will call you to review the results.   Testing/Procedures:  -NONE   Follow-Up: At [facility], you and your health needs are our priority.  As part of our continuing mission to provide you with exceptional heart care, we have created designated Provider Care Teams.  These Care Teams include your primary Cardiologist (physician) and Advanced Practice Providers (APPs -  Physician Assistants and Nurse Practitioners) who all work together to provide you with the care you need, when you need it.  We recommend signing up for the patient portal called "MyChart".  Sign up information is provided on this After Visit Summary.  MyChart is used to connect with patients for Virtual Visits (Telemedicine).  Patients are able to view lab/test results, encounter notes, upcoming appointments, etc.  Non-urgent messages can be sent to your provider as well.   To learn more about what you can do with MyChart, go to [email or web address].    Your next appointment:   2 week(s)  The format for your next appointment:   In Person  Provider:   Tereso Newcomer, PA-C         Other Instructions

## 2021-04-10 NOTE — Assessment & Plan Note (Signed)
He will need follow-up with primary care for ongoing management.

## 2021-04-10 NOTE — Assessment & Plan Note (Signed)
Blood pressures have been well controlled.  As noted, we are stopping losartan.  We could consider rechallenge in him with an ACE inhibitor once his rash is resolved.  I am also stopping his metoprolol tartrate and replacing that with carvedilol 6.25 mg twice daily.  If his blood pressure starts to creep up, consider increasing carvedilol to 12.5 mg twice daily.

## 2021-04-10 NOTE — Assessment & Plan Note (Signed)
As noted, I am holding his atorvastatin due to his rash.  Hopefully I will resume this at his follow-up in a couple weeks.  As long as he can tolerate the atorvastatin, we will recheck lipids and LFTs in 3 months.

## 2021-04-15 ENCOUNTER — Telehealth: Payer: Self-pay

## 2021-04-15 NOTE — Telephone Encounter (Signed)
Transition Care Management Follow-up Telephone Call Date of discharge and from where: 04/04/2021  [facility] How have you been since you were released from the hospital? "Doing well" Any questions or concerns? No  Items Reviewed: Did the pt receive and understand the discharge instructions provided? Yes  Medications obtained and verified? Yes  Other? No  Any new allergies since your discharge? No  Dietary orders reviewed? No Do you have support at home? Yes   Home Care and Equipment/Supplies: Were home health services ordered? not applicable If so, what is the name of the agency?  Has the agency set up a time to come to the patient's home? not applicable Were any new equipment or medical supplies ordered?   What is the name of the medical supply agency?  Were you able to get the supplies/equipment? not applicable Do you have any questions related to the use of the equipment or supplies?   Functional Questionnaire: (I = Independent and D = Dependent) ADLs: I  Bathing/Dressing- I  Meal Prep- I  Eating- I  Maintaining continence- I  Transferring/Ambulation- I  Managing Meds- I  Follow up appointments reviewed:  PCP Hospital f/u appt confirmed?  Specialist Hospital f/u appt confirmed? Yes  Scheduled to see [facility] on 04/10/2021  Are transportation arrangements needed? No  If their condition worsens, is the pt aware to call PCP or go to the Emergency Dept.? Yes Was the patient provided with contact information for the PCP's office or ED? Yes Was to pt encouraged to call back with questions or concerns? Yes  Rowe Pavy, RN, BSN, CEN [name] [employer] [phone]

## 2021-04-20 NOTE — Progress Notes (Signed)
Cardiology Office Note:    Date:  04/21/2021   ID:  [name], DOB January 01, 1976, MRN [id number]  PCP:  Mechele Claude, MD   [facility] Providers Cardiologist:  Tonny Bollman, MD Cardiology APP:  Kennon Rounds     Referring MD: Mechele Claude, MD   Chief Complaint:  F/u for CAD; rash from drug eruption    Patient Profile:   Corey Robinson is a 45 y.o. male with:  Coronary artery disease  Inf-lat STEMI 11/22 s/p DES to OM1 Staged PCI: DES to RCA; RV br occluded Echocardiogram 11/22: EF 55-60 Hypertension  Hyperlipidemia  Pre-diabetes   History of Present Illness: Mr. Daleo was last seen 04/10/2021 for posthospitalization follow-up.  He had a diffuse rash consistent with drug eruption.  I stopped his metoprolol tartrate and replaced it with carvedilol.  I also stopped his losartan and atorvastatin.  I gave him betamethasone cream to use as well as a prescription for prednisone (if needed).  He returns for close follow-up.  He is here with his.  He did have to take oral prednisone.  The pharmacy did not have the cream.  His rash is almost completely resolved.  His blood pressures have been increasing since he was taken off of losartan and was switched to carvedilol.  He has had some high readings with associated dizziness.  He has not had significant chest pain, shortness of breath, syncope, leg edema.  ASSESSMENT & PLAN:   Drug eruption He did have to take oral prednisone for 5 days.  His rash is almost completely resolved.  For now, I will keep him off of ARB.  His drug reaction was either related to metoprolol tartrate or losartan.  I think he can resume Atorvastatin in a week.  If this causes him to break out, we will need to pursue PCSK9i.  At this point, I am not sure what to list in his chart as the cause for his drug eruption.  For now, I have listed losartan and metoprolol tartrate.  Acute ST elevation myocardial infarction (STEMI) of inferolateral wall  (HCC) S/p inferolateral STEMI treated with DES to the OM1 and staged intervention with DES to the RCA.  He continues to do well post MI.  Continue aspirin, ticagrelor, carvedilol.  He was contacted by cardiac rehab but was told it was optional.  I think he should try to go to cardiac rehab for at least a month.  We discussed the benefits of doing this.  I will refer him again to cardiac rehab at [name].  He is a IT sales professional.  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  I have provided him with a note to return to desk duty next week.  He can return to full duty in February (3 months post MI).  Follow-up with Dr. Excell Seltzer or me in 3 months.  Primary hypertension His blood pressure has been increasing since he was taken off of losartan.  He has had some diastolics in the 100 range.  He brings in a list of his blood pressures today.  He is somewhat symptomatic when his pressure is higher and notes some dizziness.  I will avoid placing him back on ARB for now given his recent drug reaction.  He is tolerating carvedilol and his rash is resolving.  Continue carvedilol 6.25 mg twice daily.  Start amlodipine 5 mg daily.  He knows to contact us if his blood pressure remains elevated.  Mixed hyperlipidemia As noted, he will start on amlodipine for his blood pressure.  He will delay restarting atorvastatin for a week after the initiation of amlodipine.  Then, he will start atorvastatin 80 mg daily.  Obtain CMET, lipids in 8 weeks.  If he has a rash with atorvastatin, this will be discontinued and I will refer him to the pharmacy clinic for consideration of PCSK9 inhibitor therapy.  Anxiety He has had some issues with anxiety in the past.  These have been worse since his heart attack.  He would like a prescription for Xanax.  I have asked him to follow-up with primary care to discuss this further and decide on medical therapy.  Tobacco abuse He continues to smoke.  However, he has cut on his  use.  I have encouraged him to continue working on complete cessation.    Cardiac Rehabilitation Eligibility Assessment  The patient is ready to start cardiac rehabilitation from a cardiac standpoint.          Dispo:  Return in about 3 months (around 07/21/2021) for Routine Follow Up, w/ Dr. Excell Seltzer, or Tereso Newcomer, PA-C.    Prior CV studies: Echocardiogram 04/01/21 EF 55-60, no def WMA, Gr 1 DD, normal RVSF   Cardiac catheterization 04/01/21 LAD prox to mid 20 OM1 99 RCA prox 60, 70 PCI: 2.5 x 18 mm Onyx Frontier DES to OM1   Cardiac catheterization 04/03/21 LAD prox to mid 20 OM1 stent patent RCA prox 60. 70  PCI:  3.5 x 48 mm Synergy DES to RCA  C/b RV branch occlusion   Limited echocardiogram 04/03/21 Normal LVF, no RWMA     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 - Cath: mLAD 20, pRCA 60/70, OM1 99 >> 2.5 x 18 mm Onyx Frontier DES to OM1 // staged PCI 04/03/21: 48 x 3.5 Synergy DES to pRCA; c/b RV branch occlusion and ongoing chest pain, ant STE >> relook cath with patent stents, patent LAD, LM // Echocardiogram 11/22: EF 55-60, no RWMA (difficult windows), Gr 1 DD, normal RVSF // Limited echocardiogram after staged PCI: normal EF and   HLD (hyperlipidemia) 04/04/2021   HTN (hypertension)    Polycythemia    Pre-diabetes 01/04/2017   Current Medications: Current Meds  Medication Sig   acetaminophen (TYLENOL) 500 MG tablet Take 1,000 mg by mouth every 6 (six) hours as needed for mild pain, headache or fever.   aspirin EC 81 MG tablet Take 81 mg by mouth daily. Swallow whole.   betamethasone dipropionate 0.05 % cream 2 times daily per day X 1 week.   carvedilol (COREG) 6.25 MG tablet Take 1 tablet (6.25 mg total) by mouth 2 (two) times daily.   diphenhydrAMINE (BENADRYL ALLERGY) 25 mg capsule Take 1 capsule (25 mg total) by mouth every 6 (six) hours as needed.   nitroGLYCERIN (NITROSTAT) 0.4 MG SL tablet  Place 1 tablet (0.4 mg total) under the tongue every 5 (five) minutes as needed for chest pain.   Omega-3 Fatty Acids (FISH OIL PO) Take 1 capsule by mouth daily.   predniSONE (DELTASONE) 20 MG tablet Take 3 tablets (60 mg total) by mouth daily with breakfast. For 5 days only.   ticagrelor (BRILINTA) 90 MG TABS tablet Take 1 tablet (90 mg total) by mouth 2 (two) times daily.    Allergies:   Losartan and Metoprolol tartrate   Social History   Tobacco Use   Smoking status:  Every Day   Smokeless tobacco: Current    Family Hx: The patient's family history is not on file.  ROS see HPI  EKGs/Labs/Other Test Reviewed:    EKG:  EKG is not ordered today.  The ekg ordered today demonstrates n/a  Recent Labs: 04/04/2021: Hemoglobin 16.0; Platelets 162 04/10/2021: BUN 12; Creatinine, Ser 1.19; Potassium 4.9; Sodium 138   Recent Lipid Panel Lab Results  Component Value Date/Time   CHOL 213 (H) 04/02/2021 01:34 AM   TRIG 278 (H) 04/02/2021 01:34 AM   HDL 27 (L) 04/02/2021 01:34 AM   LDLCALC 130 (H) 04/02/2021 01:34 AM     Risk Assessment/Calculations:          Physical Exam:    VS:  BP 130/90   Pulse 71   Ht 5\' 11"  (1.803 m)   Wt 227 lb 12.8 oz (103.3 kg)   SpO2 98%   BMI 31.77 kg/m     Wt Readings from Last 3 Encounters:  04/21/21 227 lb 12.8 oz (103.3 kg)  04/10/21 228 lb 12.8 oz (103.8 kg)  04/02/21 221 lb 12.5 oz (100.6 kg)    Constitutional:      Appearance: Healthy appearance. Not in distress.  Neck:     Vascular: No JVR. JVD normal.  Pulmonary:     Effort: Pulmonary effort is normal.     Breath sounds: No wheezing. No rales.  Cardiovascular:     Normal rate. Regular rhythm. Normal S1. Normal S2.      Murmurs: There is no murmur.  Edema:    Peripheral edema absent.  Abdominal:     Palpations: Abdomen is soft.  Skin:    General: Skin is warm and dry.     Comments: Faint remnants of maculopapular rash on upper arms and chest - rash is definitely much  improved from 2 weeks ago  Neurological:     General: No focal deficit present.     Mental Status: Alert and oriented to person, place and time.     Cranial Nerves: Cranial nerves are intact.        Medication Adjustments/Labs and Tests Ordered: Current medicines are reviewed at length with the patient today.  Concerns regarding medicines are outlined above.  Tests Ordered: Orders Placed This Encounter  Procedures   Comprehensive metabolic panel   Lipid panel   AMB referral to cardiac rehabilitation    Medication Changes: No orders of the defined types were placed in this encounter.  Signed, 13/10/22, PA-C  04/21/2021 12:19 PM    [facility] [address] [place], [place], [place]  [place] Phone: [phone]; Fax: [phone]

## 2021-04-21 ENCOUNTER — Encounter: Payer: Self-pay | Admitting: Physician Assistant

## 2021-04-21 ENCOUNTER — Other Ambulatory Visit: Payer: Self-pay

## 2021-04-21 ENCOUNTER — Ambulatory Visit: Payer: BLUE CROSS/BLUE SHIELD | Admitting: Physician Assistant

## 2021-04-21 VITALS — BP 130/90 | HR 71 | Ht 71.0 in | Wt 227.8 lb

## 2021-04-21 DIAGNOSIS — I10 Essential (primary) hypertension: Secondary | ICD-10-CM

## 2021-04-21 DIAGNOSIS — Z720 Tobacco use: Secondary | ICD-10-CM | POA: Insufficient documentation

## 2021-04-21 DIAGNOSIS — L270 Generalized skin eruption due to drugs and medicaments taken internally: Secondary | ICD-10-CM

## 2021-04-21 DIAGNOSIS — F419 Anxiety disorder, unspecified: Secondary | ICD-10-CM

## 2021-04-21 DIAGNOSIS — E782 Mixed hyperlipidemia: Secondary | ICD-10-CM

## 2021-04-21 DIAGNOSIS — I2119 ST elevation (STEMI) myocardial infarction involving other coronary artery of inferior wall: Secondary | ICD-10-CM | POA: Diagnosis not present

## 2021-04-21 MED ORDER — AMLODIPINE BESYLATE 5 MG PO TABS: 5.0000 mg | ORAL_TABLET | Freq: Every day | ORAL | 3 refills | Status: DC

## 2021-04-21 NOTE — Assessment & Plan Note (Signed)
As noted, he will start on amlodipine for his blood pressure.  He will delay restarting atorvastatin for a week after the initiation of amlodipine.  Then, he will start atorvastatin 80 mg daily.  Obtain CMET, lipids in 8 weeks.  If he has a rash with atorvastatin, this will be discontinued and I will refer him to the pharmacy clinic for consideration of PCSK9 inhibitor therapy.

## 2021-04-21 NOTE — Assessment & Plan Note (Signed)
He has had some issues with anxiety in the past.  These have been worse since his heart attack.  He would like a prescription for Xanax.  I have asked him to follow-up with primary care to discuss this further and decide on medical therapy.

## 2021-04-21 NOTE — Assessment & Plan Note (Addendum)
S/p inferolateral STEMI treated with DES to the OM1 and staged intervention with DES to the RCA.  He continues to do well post MI.  Continue aspirin, ticagrelor, carvedilol.  He was contacted by cardiac rehab but was told it was optional.  I think he should try to go to cardiac rehab for at least a month.  We discussed the benefits of doing this.  I will refer him again to cardiac rehab at [facility].  He is a IT sales professional.  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  I have provided him with a note to return to desk duty next week.  He can return to full duty in February (3 months post MI).  Follow-up with Dr. Excell Seltzer or me in 3 months.

## 2021-04-21 NOTE — Assessment & Plan Note (Addendum)
He did have to take oral prednisone for 5 days.  His rash is almost completely resolved.  For now, I will keep him off of ARB.  His drug reaction was either related to metoprolol tartrate or losartan.  I think he can resume Atorvastatin in a week.  If this causes him to break out, we will need to pursue PCSK9i.  At this point, I am not sure what to list in his chart as the cause for his drug eruption.  For now, I have listed losartan and metoprolol tartrate.

## 2021-04-21 NOTE — Assessment & Plan Note (Signed)
He continues to smoke.  However, he has cut on his use.  I have encouraged him to continue working on complete cessation.

## 2021-04-21 NOTE — Patient Instructions (Addendum)
Medication Instructions:  Your physician has recommended you make the following change in your medication:  START AMLODIPINE 5 MG DAILY - START NOW  2. RESTART ATORVASTATIN 80 MG DAILY - IN 1 WEEK *If you need a refill on your cardiac medications before your next appointment, please call your pharmacy*   Lab Work: TO BE DONE IN 8 WEEKS: CMET, LIPIDS. If you have labs (blood work) drawn today and your tests are completely normal, you will receive your results only by: MyChart Message (if you have MyChart) OR A paper copy in the mail If you have any lab test that is abnormal or we need to change your treatment, we will call you to review the results.   Testing/Procedures: NONE   Follow-Up: At [name], you and your health needs are our priority.  As part of our continuing mission to provide you with exceptional heart care, we have created designated Provider Care Teams.  These Care Teams include your primary Cardiologist (physician) and Advanced Practice Providers (APPs -  Physician Assistants and Nurse Practitioners) who all work together to provide you with the care you need, when you need it.  We recommend signing up for the patient portal called "MyChart".  Sign up information is provided on this After Visit Summary.  MyChart is used to connect with patients for Virtual Visits (Telemedicine).  Patients are able to view lab/test results, encounter notes, upcoming appointments, etc.  Non-urgent messages can be sent to your provider as well.   To learn more about what you can do with MyChart, go to [email or web address].    Your next appointment:   3 month(s)  The format for your next appointment:   In Person  Provider:   Tonny Bollman, MD or Tereso Newcomer, PA-C   Other Instructions YOUR PHYSICIAN RECOMMENDS THAT  YOU FOLLOW UP WITH YOUR PRIMARY CARE DOCTOR FOR ANXIETY.   Please check your blood pressure 1 time per day and if your blood pressure is greater than 130/80  please our office at [phone].

## 2021-04-21 NOTE — Assessment & Plan Note (Signed)
His blood pressure has been increasing since he was taken off of losartan.  He has had some diastolics in the 100 range.  He brings in a list of his blood pressures today.  He is somewhat symptomatic when his pressure is higher and notes some dizziness.  I will avoid placing him back on ARB for now given his recent drug reaction.  He is tolerating carvedilol and his rash is resolving.  Continue carvedilol 6.25 mg twice daily.  Start amlodipine 5 mg daily.  He knows to contact us if his blood pressure remains elevated.

## 2021-04-21 NOTE — Addendum Note (Signed)
Addended by: Michaelle Copas on: 04/21/2021 05:03 PM   Modules accepted: Orders

## 2021-05-04 DIAGNOSIS — Z0279 Encounter for issue of other medical certificate: Secondary | ICD-10-CM

## 2021-05-05 ENCOUNTER — Ambulatory Visit: Payer: BC Managed Care – PPO | Admitting: Family Medicine

## 2021-05-05 ENCOUNTER — Encounter: Payer: Self-pay | Admitting: Family Medicine

## 2021-05-05 VITALS — BP 128/75 | HR 72 | Temp 97.2°F | Ht 71.0 in | Wt 227.2 lb

## 2021-05-05 DIAGNOSIS — I2510 Atherosclerotic heart disease of native coronary artery without angina pectoris: Secondary | ICD-10-CM | POA: Diagnosis not present

## 2021-05-05 DIAGNOSIS — F419 Anxiety disorder, unspecified: Secondary | ICD-10-CM | POA: Diagnosis not present

## 2021-05-05 MED ORDER — ESCITALOPRAM OXALATE 10 MG PO TABS: 10.0000 mg | ORAL_TABLET | Freq: Every day | ORAL | 1 refills | Status: DC

## 2021-05-05 NOTE — Progress Notes (Signed)
Subjective:  Patient ID: Corey Robinson, male    DOB: 1976-01-03  Age: 45 y.o. MRN: [id number]  CC: Anxiety   HPI Corey Robinson presents for panic attacks since cardiac stents placed a month ago. Feeling anxious. Has some relief with xanax scrip. Left over from the past.  Occurring every couple of days. Felt overwhelmed, dyspneic yesterday. Has chest pain on right. Feels like he can't get enough air. Irritable. Last about 15 min.   GAD 7 : Generalized Anxiety Score 05/05/2021  Nervous, Anxious, on Edge 2  Control/stop worrying 1  Worry too much - different things 1  Trouble relaxing 1  Restless 0  Easily annoyed or irritable 2  Afraid - awful might happen 2  Total GAD 7 Score 9  Anxiety Difficulty Somewhat difficult      Depression screen [facility] 2/9 05/05/2021 05/05/2021 02/16/2017  Decreased Interest 0 0 0  Down, Depressed, Hopeless 0 0 0  PHQ - 2 Score 0 0 0  Altered sleeping 1 - -  Tired, decreased energy 1 - -  Change in appetite 0 - -  Feeling bad or failure about yourself  0 - -  Trouble concentrating 0 - -  Moving slowly or fidgety/restless 0 - -  Suicidal thoughts 0 - -  PHQ-9 Score 2 - -  Difficult doing work/chores Not difficult at all - -    History Corey Robinson has a past medical history of CAD (coronary artery disease) (04/03/2021), HLD (hyperlipidemia) (04/04/2021), HTN (hypertension), Polycythemia, and Pre-diabetes (01/04/2017).   He has a past surgical history that includes LEFT HEART CATH AND CORONARY ANGIOGRAPHY (N/A, 04/01/2021); CORONARY STENT INTERVENTION (N/A, 04/01/2021); CORONARY STENT INTERVENTION (N/A, 04/03/2021); and CORONARY ANGIOGRAPHY (N/A, 04/03/2021).   His family history is not on file.He reports that he has been smoking. He uses smokeless tobacco. No history on file for alcohol use and drug use.    ROS Review of Systems  Constitutional:  Negative for fever.  Respiratory:  Positive for shortness of breath.   Cardiovascular:  Positive for  chest pain.  Musculoskeletal:  Negative for arthralgias.  Skin:  Negative for rash.   Objective:  BP 128/75    Pulse 72    Temp (!) 97.2 F (36.2 C)    Ht 5\' 11"  (1.803 m)    Wt 227 lb 3.2 oz (103.1 kg)    SpO2 98%    BMI 31.69 kg/m   BP Readings from Last 3 Encounters:  05/05/21 128/75  04/21/21 130/90  04/10/21 128/72    Wt Readings from Last 3 Encounters:  05/05/21 227 lb 3.2 oz (103.1 kg)  04/21/21 227 lb 12.8 oz (103.3 kg)  04/10/21 228 lb 12.8 oz (103.8 kg)     Physical Exam Vitals reviewed.  Constitutional:      Appearance: He is well-developed.  HENT:     Head: Normocephalic and atraumatic.     Right Ear: External ear normal.     Left Ear: External ear normal.     Mouth/Throat:     Pharynx: No oropharyngeal exudate or posterior oropharyngeal erythema.  Eyes:     Pupils: Pupils are equal, round, and reactive to light.  Cardiovascular:     Rate and Rhythm: Normal rate and regular rhythm.     Heart sounds: No murmur heard. Pulmonary:     Effort: No respiratory distress.     Breath sounds: Normal breath sounds.  Musculoskeletal:     Cervical back: Normal range of motion and neck  supple.  Neurological:     Mental Status: He is alert and oriented to person, place, and time.      Assessment & Plan:   Corey Robinson was seen today for anxiety.  Diagnoses and all orders for this visit:  Anxiety  Other orders -     escitalopram (LEXAPRO) 10 MG tablet; Take 1 tablet (10 mg total) by mouth daily.      I have discontinued Elige Radon T. Sligh's predniSONE. I am also having him start on escitalopram. Additionally, I am having him maintain his Omega-3 Fatty Acids (FISH OIL PO), aspirin EC, acetaminophen, nitroGLYCERIN, ticagrelor, betamethasone dipropionate, diphenhydrAMINE, carvedilol, and amLODipine.  Allergies as of 05/05/2021       Reactions   Losartan Rash   ???-Drug eruption noted post hosp>>Losartan, Metoprolol Tartrate, Atorvastatin all DCd w resolution    Metoprolol Tartrate Rash   ???-Drug eruption noted post hosp in 03/2021>>Losartan, Metoprolol Tartrate, Atorvastatin all DCd w resolution        Medication List        Accurate as of May 05, 2021  7:37 PM. If you have any questions, ask your nurse or doctor.          STOP taking these medications    predniSONE 20 MG tablet Commonly known as: DELTASONE Stopped by: Mechele Claude, MD       TAKE these medications    acetaminophen 500 MG tablet Commonly known as: TYLENOL Take 1,000 mg by mouth every 6 (six) hours as needed for mild pain, headache or fever.   amLODipine 5 MG tablet Commonly known as: NORVASC Take 1 tablet (5 mg total) by mouth daily.   aspirin EC 81 MG tablet Take 81 mg by mouth daily. Swallow whole.   betamethasone dipropionate 0.05 % cream 2 times daily per day X 1 week.   carvedilol 6.25 MG tablet Commonly known as: COREG Take 1 tablet (6.25 mg total) by mouth 2 (two) times daily.   diphenhydrAMINE 25 mg capsule Commonly known as: Benadryl Allergy Take 1 capsule (25 mg total) by mouth every 6 (six) hours as needed.   escitalopram 10 MG tablet Commonly known as: LEXAPRO Take 1 tablet (10 mg total) by mouth daily. Started by: Mechele Claude, MD   FISH OIL PO Take 1 capsule by mouth daily.   nitroGLYCERIN 0.4 MG SL tablet Commonly known as: NITROSTAT Place 1 tablet (0.4 mg total) under the tongue every 5 (five) minutes as needed for chest pain.   ticagrelor 90 MG Tabs tablet Commonly known as: BRILINTA Take 1 tablet (90 mg total) by mouth 2 (two) times daily.         Follow-up: Return in about 1 month (around 06/05/2021).  Mechele Claude, M.D.

## 2021-05-06 ENCOUNTER — Telehealth: Payer: Self-pay | Admitting: Cardiovascular Disease

## 2021-05-06 NOTE — Telephone Encounter (Signed)
Forms from [employer] received on 05/06/2021. Completed patient auth attached. Took form to Dr. Excell Seltzer box for completion. 05/06/2021 JMM

## 2021-05-08 ENCOUNTER — Telehealth: Payer: Self-pay | Admitting: Physician Assistant

## 2021-05-08 NOTE — Telephone Encounter (Signed)
° °  The patient called the answering service after-hours today. Chart reviewed. Recently started amlodipine for BP, advised to call if BP still running higher. Systolic BPs have been running 126-150, and diastolic BPs also elevated. His primary concern is that the diastolics are consistently high in the 90s and sometimes slightly over 100 (I.e. 101). He confirms he is also taking carvedilol as prescribed and is also tolerating the amlodipine. No cardiac symptoms. I advised he increase his amlodipine to 2 tablet daily for now (10mg  total), to keep a log of BPs and submit readings on Tuesday to [facility] for review. Also discussed decreasing sodium, caffeine, alcohol in the diet. The patient verbalized understanding and gratitude.  [facility], PA-C

## 2021-05-10 NOTE — Telephone Encounter (Signed)
Agree. Increase Amlodipine to 10 mg once daily. Send BP readings in 1 week. Tereso Newcomer, PA-C    05/10/2021 6:33 PM

## 2021-05-11 ENCOUNTER — Other Ambulatory Visit: Payer: Self-pay | Admitting: *Deleted

## 2021-05-11 MED ORDER — AMLODIPINE BESYLATE 5 MG PO TABS: 10.0000 mg | ORAL_TABLET | Freq: Every day | ORAL | 3 refills | Status: DC

## 2021-05-11 NOTE — Telephone Encounter (Signed)
Updated medication list

## 2021-05-14 ENCOUNTER — Encounter (HOSPITAL_COMMUNITY)
Admission: RE | Admit: 2021-05-14 | Discharge: 2021-05-14 | Disposition: A | Discharge status: Home / Self Care | Payer: BC Managed Care – PPO | Source: Ambulatory Visit | Attending: Cardiovascular Disease | Admitting: Cardiovascular Disease

## 2021-05-14 ENCOUNTER — Encounter (HOSPITAL_COMMUNITY): Payer: Self-pay

## 2021-05-14 VITALS — BP 118/60 | HR 72 | Ht 71.0 in | Wt 218.0 lb

## 2021-05-14 DIAGNOSIS — I213 ST elevation (STEMI) myocardial infarction of unspecified site: Secondary | ICD-10-CM | POA: Insufficient documentation

## 2021-05-14 DIAGNOSIS — Z955 Presence of coronary angioplasty implant and graft: Secondary | ICD-10-CM | POA: Diagnosis not present

## 2021-05-14 NOTE — Progress Notes (Signed)
Cardiac Individual Treatment Plan  Patient Details  Name: Corey Robinson MRN: [id number] Date of Birth: 01/19/1976 Referring Provider:   [name] from 05/14/2021 in [facility] CARDIAC REHABILITATION  Referring Provider Dr. Excell Seltzer       Initial Encounter Date:  [name] from 05/14/2021 in [place] CARDIAC REHABILITATION  Date 05/14/21       Visit Diagnosis: ST elevation myocardial infarction (STEMI), unspecified artery (HCC)  S/P coronary artery stent placement  Patient's Home Medications on Admission:  Current Outpatient Medications:    acetaminophen (TYLENOL) 500 MG tablet, Take 1,000 mg by mouth every 6 (six) hours as needed for mild pain, headache or fever., Disp: , Rfl:    aspirin EC 81 MG tablet, Take 81 mg by mouth daily. Swallow whole., Disp: , Rfl:    atorvastatin (LIPITOR) 80 MG tablet, Take 80 mg by mouth every evening., Disp: , Rfl:    carvedilol (COREG) 6.25 MG tablet, Take 1 tablet (6.25 mg total) by mouth 2 (two) times daily., Disp: 60 tablet, Rfl: 3   escitalopram (LEXAPRO) 10 MG tablet, Take 1 tablet (10 mg total) by mouth daily. (Patient taking differently: Take 10 mg by mouth every evening.), Disp: 90 tablet, Rfl: 1   nicotine (NICODERM CQ - DOSED IN MG/24 HOURS) 14 mg/24hr patch, Place 14 mg onto the skin daily as needed (smoking cessation)., Disp: , Rfl:    nitroGLYCERIN (NITROSTAT) 0.4 MG SL tablet, Place 1 tablet (0.4 mg total) under the tongue every 5 (five) minutes as needed for chest pain., Disp: 25 tablet, Rfl: 12   Omega-3 Fatty Acids (FISH OIL PO), Take 1,200 mg by mouth every evening., Disp: , Rfl:    omeprazole (PRILOSEC) 20 MG capsule, Take 20 mg by mouth daily as needed (indigestion/heartburn.)., Disp: , Rfl:    ticagrelor (BRILINTA) 90 MG TABS tablet, Take 1 tablet (90 mg total) by mouth 2 (two) times daily., Disp: 60 tablet, Rfl: 11   amLODipine (NORVASC) 5 MG  tablet, Take 2 tablets (10 mg total) by mouth daily., Disp: 180 tablet, Rfl: 3   betamethasone dipropionate 0.05 % cream, 2 times daily per day X 1 week. (Patient not taking: Reported on 05/06/2021), Disp: 30 g, Rfl: 0   diphenhydrAMINE (BENADRYL ALLERGY) 25 mg capsule, Take 1 capsule (25 mg total) by mouth every 6 (six) hours as needed. (Patient not taking: Reported on 05/06/2021), Disp: 30 capsule, Rfl: 0  Past Medical History: 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 - Cath: mLAD 20, pRCA 60/70, OM1 99 >> 2.5 x 18 mm Onyx Frontier DES to OM1 // staged PCI 04/03/21: 48 x 3.5 Synergy DES to pRCA; c/b RV branch occlusion and ongoing chest pain, ant STE >> relook cath with patent stents, patent LAD, LM // Echocardiogram 11/22: EF 55-60, no RWMA (difficult windows), Gr 1 DD, normal RVSF // Limited echocardiogram after staged PCI: normal EF and   HLD (hyperlipidemia) 04/04/2021   HTN (hypertension)    Polycythemia    Pre-diabetes 01/04/2017    Tobacco Use: Social History   Tobacco Use  Smoking Status Former   Packs/day: 1.50   Years: 25.00   Pack years: 37.50   Types: Cigarettes   Quit date: 05/07/2021   Years since quitting: 0.0  Smokeless Tobacco Current    Labs: Recent Review Flowsheet Data     Labs for ITP Cardiac and  Pulmonary Rehab Latest Ref Rng & Units 09/01/2016 01/04/2017 04/01/2021 04/02/2021   Cholestrol 0 - 200 mg/dL - - 010(X) 323(F)   LDLCALC 0 - 99 mg/dL - - 573(U) 202(R)   HDL >40 mg/dL - - 42(H) 06(C)   Trlycerides <150 mg/dL - - 376(E) 831(D)   Hemoglobin A1c 4.8 - 5.6 % 5.5 5.7 6.1(H) -       Capillary Blood Glucose: Lab Results  Component Value Date   GLUCAP 109 (H) 04/01/2021     Exercise Target Goals: Exercise Program Goal: Individual exercise prescription set using results from initial 6 min walk test and THRR while considering  patients activity barriers and safety.   Exercise  Prescription Goal: Starting with aerobic activity 30 plus minutes a day, 3 days per week for initial exercise prescription. Provide home exercise prescription and guidelines that participant acknowledges understanding prior to discharge.  Activity Barriers & Risk Stratification:  Activity Barriers & Cardiac Risk Stratification - 05/14/21 0852       Activity Barriers & Cardiac Risk Stratification   Activity Barriers Balance Concerns    Cardiac Risk Stratification High             6 Minute Walk:  6 Minute Walk     Row Name 05/14/21 1008         6 Minute Walk   Phase Initial     Distance 1150 feet     Walk Time 6 minutes     # of Rest Breaks 1     MPH 2.2     METS 3.6     RPE 11     VO2 Peak 12.61     Symptoms No     Resting HR 66 bpm     Resting BP 118/60     Resting Oxygen Saturation  98 %     Exercise Oxygen Saturation  during 6 min walk 98 %     Max Ex. HR 72 bpm     Max Ex. BP 120/60     2 Minute Post BP 115/60              Oxygen Initial Assessment:   Oxygen Re-Evaluation:   Oxygen Discharge (Final Oxygen Re-Evaluation):   Initial Exercise Prescription:  Initial Exercise Prescription - 05/14/21 1000       Date of Initial Exercise RX and Referring Provider   Date 05/14/21    Referring Provider Dr. Excell Seltzer    Expected Discharge Date 08/10/21      Treadmill   MPH 1.4    Grade 0    Minutes 17      Elliptical   Level 1    Speed 60    Minutes 22      Prescription Details   Frequency (times per week) 3    Duration Progress to 30 minutes of continuous aerobic without signs/symptoms of physical distress      Intensity   THRR 40-80% of Max Heartrate 70-141    Ratings of Perceived Exertion 11-13    Perceived Dyspnea 0-4      Resistance Training   Training Prescription Yes    Weight 4    Reps 10-15             Perform Capillary Blood Glucose checks as needed.  Exercise Prescription Changes:   Exercise Comments:   Exercise  Goals and Review:   Exercise Goals     Row Name 05/14/21 1011  Exercise Goals   Increase Physical Activity Yes       Intervention Provide advice, education, support and counseling about physical activity/exercise needs.;Develop an individualized exercise prescription for aerobic and resistive training based on initial evaluation findings, risk stratification, comorbidities and participant's personal goals.       Expected Outcomes Short Term: Attend rehab on a regular basis to increase amount of physical activity.;Long Term: Add in home exercise to make exercise part of routine and to increase amount of physical activity.;Long Term: Exercising regularly at least 3-5 days a week.       Increase Strength and Stamina Yes       Intervention Provide advice, education, support and counseling about physical activity/exercise needs.;Develop an individualized exercise prescription for aerobic and resistive training based on initial evaluation findings, risk stratification, comorbidities and participant's personal goals.       Expected Outcomes Short Term: Increase workloads from initial exercise prescription for resistance, speed, and METs.;Short Term: Perform resistance training exercises routinely during rehab and add in resistance training at home;Long Term: Improve cardiorespiratory fitness, muscular endurance and strength as measured by increased METs and functional capacity ( )       Able to understand and use rate of perceived exertion (RPE) scale Yes       Intervention Provide education and explanation on how to use RPE scale       Expected Outcomes Short Term: Able to use RPE daily in rehab to express subjective intensity level;Long Term:  Able to use RPE to guide intensity level when exercising independently       Knowledge and understanding of Target Heart Rate Range (THRR) Yes       Intervention Provide education and explanation of THRR including how the numbers were predicted  and where they are located for reference       Expected Outcomes Short Term: Able to state/look up THRR;Long Term: Able to use THRR to govern intensity when exercising independently;Short Term: Able to use daily as guideline for intensity in rehab       Able to check pulse independently Yes       Intervention Provide education and demonstration on how to check pulse in carotid and radial arteries.;Review the importance of being able to check your own pulse for safety during independent exercise       Expected Outcomes Short Term: Able to explain why pulse checking is important during independent exercise;Long Term: Able to check pulse independently and accurately       Understanding of Exercise Prescription Yes       Intervention Provide education, explanation, and written materials on patient's individual exercise prescription       Expected Outcomes Short Term: Able to explain program exercise prescription;Long Term: Able to explain home exercise prescription to exercise independently                Exercise Goals Re-Evaluation :    Discharge Exercise Prescription (Final Exercise Prescription Changes):   Nutrition:  Target Goals: Understanding of nutrition guidelines, daily intake of sodium 1500mg , cholesterol 200mg , calories 30% from fat and 7% or less from saturated fats, daily to have 5 or more servings of fruits and vegetables.  Biometrics:  Pre Biometrics - 05/14/21 1012       Pre Biometrics   Height 5\' 11"  (1.803 m)    Weight 98.9 kg    Waist Circumference 41 inches    Hip Circumference 42 inches    Waist to Hip Ratio 0.98 %  BMI (Calculated) 30.42    Triceps Skinfold 5 mm    % Body Fat 23.5 %    Grip Strength 53.6 kg    Flexibility 14 in    Single Leg Stand 30 seconds              Nutrition Therapy Plan and Nutrition Goals:  Nutrition Therapy & Goals - 05/14/21 0931       Personal Nutrition Goals   Comments Patient scored 42 on his diet assessment.  Handout provided and explained regarding healthier choices. Score also discussed. We offer 2 educational sessions on  heart healthy nutrition with handouts and assistance with RD referral if patient is interested. Patient verbaized understanding.      Intervention Plan   Intervention Nutrition handout(s) given to patient.    Expected Outcomes Short Term Goal: Understand basic principles of dietary content, such as calories, fat, sodium, cholesterol and nutrients.             Nutrition Assessments:  Nutrition Assessments - 05/14/21 0933       MEDFICTS Scores   Pre Score 42            MEDIFICTS Score Key: ?70 Need to make dietary changes  40-70 Heart Healthy Diet ? 40 Therapeutic Level Cholesterol Diet   Picture Your Plate Scores: <16 Unhealthy dietary pattern with much room for improvement. 41-50 Dietary pattern unlikely to meet recommendations for good health and room for improvement. 51-60 More healthful dietary pattern, with some room for improvement.  >60 Healthy dietary pattern, although there may be some specific behaviors that could be improved.    Nutrition Goals Re-Evaluation:   Nutrition Goals Discharge (Final Nutrition Goals Re-Evaluation):   Psychosocial: Target Goals: Acknowledge presence or absence of significant depression and/or stress, maximize coping skills, provide positive support system. Participant is able to verbalize types and ability to use techniques and skills needed for reducing stress and depression.  Initial Review & Psychosocial Screening:  Initial Psych Review & Screening - 05/14/21 0939       Initial Review   Current issues with Current Anxiety/Panic      Family Dynamics   Good Support System? Yes      Barriers   Psychosocial barriers to participate in program The patient should benefit from training in stress management and relaxation.;Psychosocial barriers identified (see note)      Screening Interventions   Interventions  Encouraged to exercise;Provide feedback about the scores to participant    Expected Outcomes Short Term goal: Identification and review with participant of any Quality of Life or Depression concerns found by scoring the questionnaire.             Quality of Life Scores:  Quality of Life - 05/14/21 1013       Quality of Life   Select Quality of Life      Quality of Life Scores   Health/Function Pre 15.47 %    Socioeconomic Pre 19.5 %    Psych/Spiritual Pre 20.36 %    Family Pre 22.4 %    GLOBAL Pre 18.32 %            Scores of 19 and below usually indicate a poorer quality of life in these areas.  A difference of  2-3 points is a clinically meaningful difference.  A difference of 2-3 points in the total score of the Quality of Life Index has been associated with significant improvement in overall quality of life, self-image, physical symptoms, and general  health in studies assessing change in quality of life.  PHQ-9: Recent Review Flowsheet Data     Depression screen [facility] 2/9 05/14/2021 05/05/2021 05/05/2021 02/16/2017 01/04/2017   Decreased Interest 1 0 0 0 0   Down, Depressed, Hopeless 0 0 0 0 0   PHQ - 2 Score 1 0 0 0 0   Altered sleeping 2 1 - - -   Tired, decreased energy 1 1 - - -   Change in appetite 1 0 - - -   Feeling bad or failure about yourself  1 0 - - -   Trouble concentrating 0 0 - - -   Moving slowly or fidgety/restless 0 0 - - -   Suicidal thoughts 0 0 - - -   PHQ-9 Score 6 2 - - -   Difficult doing work/chores Somewhat difficult Not difficult at all - - -      Interpretation of Total Score  Total Score Depression Severity:  1-4 = Minimal depression, 5-9 = Mild depression, 10-14 = Moderate depression, 15-19 = Moderately severe depression, 20-27 = Severe depression   Psychosocial Evaluation and Intervention:  Psychosocial Evaluation - 05/14/21 0941       Psychosocial Evaluation & Interventions   Interventions Stress management education;Relaxation  education;Encouraged to exercise with the program and follow exercise prescription    Comments Patient has anxiety which he says he believes he has had a long time but he was able to manage. 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 He says he wants to try it and see if he can. His pcp started him on Lexapro a few days ago to help manage his anxiety. He says it may have improved some but he understands it will take 2 to 4 weeks for Lexapro to make a difference. His PHQ-9 score was 6 and his overall QOL was 18.32% socring lowest in health/function at 15.47%. 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 He believes when he is able to get back to work full time he will be able to manage the anxiety better. He has also recently quit smoking 05/07/21 which is increasing his anxiety. He has 7 children from 16 to 77 years old. His wife is a Engineer, civil (consulting) and he names her as his support person. He wants to try the program hoping it will get him back to normal and help get his anxierty better managed.    Expected Outcomes Patient's anxiety will be managed better with medication and exercise and he will be able to exercise wearing a mask.    Continue Psychosocial Services  Follow up required by staff             Psychosocial Re-Evaluation:   Psychosocial Discharge (Final Psychosocial Re-Evaluation):   Vocational Rehabilitation: Provide vocational rehab assistance to qualifying candidates.   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 He does not need vocational rehab.              Education: Education Goals: Education classes will be provided on  a weekly basis, covering required topics. Participant will state understanding/return demonstration of topics presented.  Learning Barriers/Preferences:  Learning Barriers/Preferences - 05/14/21 0935       Learning Barriers/Preferences   Learning Barriers None    Learning Preferences Written Material             Education Topics: Hypertension, Hypertension Reduction -Define heart disease and high blood pressure. Discus how high blood pressure affects the body and ways to reduce high blood pressure.   Exercise and Your Heart -Discuss why it is important to exercise, the FITT principles of exercise, normal and abnormal responses to exercise, and how to exercise safely.   Angina -Discuss definition of angina, causes of angina, treatment of angina, and how to decrease risk of having angina.   Cardiac Medications -Review what the following cardiac medications are used for, how they affect the body, and side effects that may occur when taking the medications.  Medications include Aspirin, Beta blockers, calcium channel blockers, ACE Inhibitors, angiotensin receptor blockers, diuretics, digoxin, and antihyperlipidemics.   Congestive Heart Failure -Discuss the definition of CHF, how to live with CHF, the signs and symptoms of CHF, and how keep track of weight and sodium intake.   Heart Disease and Intimacy -Discus the effect sexual activity has on the heart, how changes occur during intimacy as we age, and safety during sexual activity.   Smoking Cessation / COPD -Discuss different methods to quit smoking, the health benefits of quitting smoking, and the definition of COPD.   Nutrition I: Fats -Discuss the types of cholesterol, what cholesterol does to the heart, and how cholesterol levels can be controlled.   Nutrition II: Labels -Discuss the different components of food labels and how to read  food label   Heart Parts/Heart Disease and PAD -Discuss the anatomy of the heart, the pathway of blood circulation through the heart, and these are affected by heart disease.   Stress I: Signs and Symptoms -Discuss the causes of stress, how stress may lead to anxiety and depression, and ways to limit stress.   Stress II: Relaxation -Discuss different types of relaxation techniques to limit stress.   Warning Signs of Stroke / TIA -Discuss definition of a stroke, what the signs and symptoms are of a stroke, and how to identify when someone is having stroke.   Knowledge Questionnaire Score:  Knowledge Questionnaire Score - 05/14/21 0933       Knowledge Questionnaire Score   Pre Score 23/28             Core Components/Risk Factors/Patient Goals at Admission:  Personal Goals and Risk Factors at Admission - 05/14/21 0936       Core Components/Risk Factors/Patient Goals on Admission    Weight Management Obesity    Lipids Yes    Intervention Provide education and support for participant on nutrition & aerobic/resistive exercise along with prescribed medications to achieve LDL 70mg , HDL >40mg .    Expected Outcomes Short Term: Participant states understanding of desired cholesterol values and is compliant with medications prescribed. Participant is following exercise prescription and nutrition guidelines.;Long Term: Cholesterol controlled with medications as prescribed, with individualized exercise RX and with personalized nutrition plan. Value goals: LDL < , HDL > 40 mg.    Personal Goal Other Yes    Personal Goal Patient wants to get back to normal; get back to work; and be able to manage his anxiety.    Intervention Patient will attend CR 3 days/week with exercise and education and  supplement with exercise at home.    Expected Outcomes Patient will complete the program meeting both program and personal goals.             Core Components/Risk Factors/Patient Goals  Review:    Core Components/Risk Factors/Patient Goals at Discharge (Final Review):    ITP Comments:   Comments: Patient arrived for 1st visit/orientation/education at 0800. Patient was referred to CR by Tonny Bollman, MD due to STEMI (I21.3) and S/P Coronary artery stent placement (Z95.5). During orientation advised patient on arrival and appointment times what to wear, what to do before, during and after exercise. Reviewed attendance and class policy.  Pt is scheduled to return Cardiac Rehab on 05/20/21 at 9:30. Pt was advised to come to class 15 minutes before class starts.  Discussed RPE/Dpysnea scales. Patient participated in warm up stretches. Patient was able to complete 6 minute walk test.  Telemetry:NSR. Patient was measured for the equipment. Discussed equipment safety with patient. Took patient pre-anthropometric measurements. Patient finished visit at 950.

## 2021-05-20 ENCOUNTER — Encounter (HOSPITAL_COMMUNITY)
Admission: RE | Admit: 2021-05-20 | Discharge: 2021-05-20 | Disposition: A | Discharge status: Home / Self Care | Payer: BC Managed Care – PPO | Source: Ambulatory Visit | Attending: Cardiovascular Disease | Admitting: Cardiovascular Disease

## 2021-05-20 DIAGNOSIS — Z955 Presence of coronary angioplasty implant and graft: Secondary | ICD-10-CM

## 2021-05-20 DIAGNOSIS — I213 ST elevation (STEMI) myocardial infarction of unspecified site: Secondary | ICD-10-CM | POA: Diagnosis not present

## 2021-05-20 NOTE — Progress Notes (Signed)
Daily Session Note  Patient Details  Name: Corey Robinson MRN: [id number] Date of Birth: 1976/04/12 Referring Provider:   [name] from 05/14/2021 in [place]  Referring Provider Dr. Burt Knack       Encounter Date: 05/20/2021  Check In:  Session Check In - 05/20/21 0930       Check-In   Supervising physician immediately available to respond to emergencies [facility] MD immediately available    Physician(s) Dr. Johnsie Cancel    Location [facility]    Staff Present Hoy Register, MS, ACSM-CEP, Exercise Physiologist;Magdelene Ruark Zigmund Daniel, Exercise Physiologist    Virtual Visit No    Medication changes reported     No    Fall or balance concerns reported    No    Tobacco Cessation No Change    Warm-up and Cool-down Performed as group-led instruction    Resistance Training Performed Yes    VAD Patient? No    PAD/SET Patient? No      Pain Assessment   Currently in Pain? No/denies    Pain Score 0-No pain    Multiple Pain Sites No             Capillary Blood Glucose: No results found for this or any previous visit (from the past 24 hour(s)).    Social History   Tobacco Use  Smoking Status Former   Packs/day: 1.50   Years: 25.00   Pack years: 37.50   Types: Cigarettes   Quit date: 05/07/2021   Years since quitting: 0.0  Smokeless Tobacco Current    Goals Met:  Independence with exercise equipment Exercise tolerated well No report of concerns or symptoms today Strength training completed today  Goals Unmet:  Not Applicable  Comments: check out 1030   Dr. Kathie Dike is Medical Director for [facility] Pulmonary Rehab.

## 2021-05-22 ENCOUNTER — Encounter (HOSPITAL_COMMUNITY)
Admission: RE | Admit: 2021-05-22 | Discharge: 2021-05-22 | Disposition: A | Discharge status: Home / Self Care | Payer: BC Managed Care – PPO | Source: Ambulatory Visit | Attending: Cardiovascular Disease | Admitting: Cardiovascular Disease

## 2021-05-22 DIAGNOSIS — Z955 Presence of coronary angioplasty implant and graft: Secondary | ICD-10-CM | POA: Diagnosis not present

## 2021-05-22 DIAGNOSIS — I213 ST elevation (STEMI) myocardial infarction of unspecified site: Secondary | ICD-10-CM | POA: Diagnosis not present

## 2021-05-22 NOTE — Progress Notes (Signed)
Daily Session Note  Patient Details  Name: Corey Robinson MRN: [id number] Date of Birth: [birth year] Referring Provider:   [name] from 05/14/2021 in [place]  Referring Provider Dr. Burt Knack       Encounter Date: 05/22/2021  Check In:  Session Check In - 05/22/21 0930       Check-In   Supervising physician immediately available to respond to emergencies [facility] MD immediately available    Physician(s) Dr. Johnsie Cancel    Location [facility]    Staff Present Hoy Register, MS, ACSM-CEP, Exercise Physiologist;Oretha Weismann Zigmund Daniel, Exercise Physiologist;Other    Virtual Visit No    Medication changes reported     No    Fall or balance concerns reported    No    Tobacco Cessation No Change    Warm-up and Cool-down Performed as group-led instruction    Resistance Training Performed Yes    VAD Patient? No    PAD/SET Patient? No      Pain Assessment   Currently in Pain? No/denies    Pain Score 0-No pain    Multiple Pain Sites No             Capillary Blood Glucose: No results found for this or any previous visit (from the past 24 hour(s)).    Social History   Tobacco Use  Smoking Status Former   Packs/day: 1.50   Years: 25.00   Pack years: 37.50   Types: Cigarettes   Quit date: 05/07/2021   Years since quitting: 0.0  Smokeless Tobacco Current    Goals Met:  Independence with exercise equipment Exercise tolerated well No report of concerns or symptoms today Strength training completed today  Goals Unmet:  Not Applicable  Comments: check out 1030   Dr. Kathie Dike is Medical Director for [facility] Pulmonary Rehab.

## 2021-05-25 ENCOUNTER — Encounter (HOSPITAL_COMMUNITY): Payer: BC Managed Care – PPO

## 2021-05-27 ENCOUNTER — Encounter (HOSPITAL_COMMUNITY)
Admission: RE | Admit: 2021-05-27 | Discharge: 2021-05-27 | Disposition: A | Discharge status: Home / Self Care | Payer: BC Managed Care – PPO | Source: Ambulatory Visit | Attending: Cardiovascular Disease | Admitting: Cardiovascular Disease

## 2021-05-27 DIAGNOSIS — Z955 Presence of coronary angioplasty implant and graft: Secondary | ICD-10-CM | POA: Insufficient documentation

## 2021-05-27 DIAGNOSIS — I213 ST elevation (STEMI) myocardial infarction of unspecified site: Secondary | ICD-10-CM | POA: Diagnosis not present

## 2021-05-27 NOTE — Progress Notes (Signed)
Daily Session Note  Patient Details  Name: Corey Robinson MRN: [id number] Date of Birth: Sep 02, 1975 Referring Provider:   [name] from 05/14/2021 in [facility]  Referring Provider Dr. Burt Knack       Encounter Date: 05/27/2021  Check In:  Session Check In - 05/27/21 0930       Check-In   Supervising physician immediately available to respond to emergencies [facility] MD immediately available    Physician(s) Dr. Harl Bowie    Location [facility]    Staff Present Hoy Register, MS, ACSM-CEP, Exercise Physiologist;Heather Zigmund Daniel, Exercise Physiologist;Other;Kaysi Ourada, RN    Virtual Visit No    Medication changes reported     No    Fall or balance concerns reported    No    Tobacco Cessation No Change    Warm-up and Cool-down Performed as group-led instruction    Resistance Training Performed Yes    VAD Patient? No    PAD/SET Patient? No      Pain Assessment   Currently in Pain? No/denies    Pain Score 0-No pain    Multiple Pain Sites No             Capillary Blood Glucose: No results found for this or any previous visit (from the past 24 hour(s)).    Social History   Tobacco Use  Smoking Status Former   Packs/day: 1.50   Years: 25.00   Pack years: 37.50   Types: Cigarettes   Quit date: 05/07/2021   Years since quitting: 0.0  Smokeless Tobacco Current    Goals Met:  Independence with exercise equipment Exercise tolerated well No report of concerns or symptoms today Strength training completed today  Goals Unmet:  Not Applicable  Comments: check out @ 10:30am   Dr. Kathie Dike is Medical Director for [facility].

## 2021-05-27 NOTE — Progress Notes (Signed)
Cardiac Individual Treatment Plan  Patient Details  Name: Corey Robinson MRN: [id number] Date of Birth: [birth year] Referring Provider:   [name] from 05/14/2021 in [facility] CARDIAC REHABILITATION  Referring Provider Dr. Excell Seltzer       Initial Encounter Date:  [name] from 05/14/2021 in [facility] [place] CARDIAC REHABILITATION  Date 05/14/21       Visit Diagnosis: ST elevation myocardial infarction (STEMI), unspecified artery (HCC)  S/P coronary artery stent placement  Patient's Home Medications on Admission:  Current Outpatient Medications:    acetaminophen (TYLENOL) 500 MG tablet, Take 1,000 mg by mouth every 6 (six) hours as needed for mild pain, headache or fever., Disp: , Rfl:    amLODipine (NORVASC) 5 MG tablet, Take 2 tablets (10 mg total) by mouth daily., Disp: 180 tablet, Rfl: 3   aspirin EC 81 MG tablet, Take 81 mg by mouth daily. Swallow whole., Disp: , Rfl:    atorvastatin (LIPITOR) 80 MG tablet, Take 80 mg by mouth every evening., Disp: , Rfl:    betamethasone dipropionate 0.05 % cream, 2 times daily per day X 1 week. (Patient not taking: Reported on 05/06/2021), Disp: 30 g, Rfl: 0   carvedilol (COREG) 6.25 MG tablet, Take 1 tablet (6.25 mg total) by mouth 2 (two) times daily., Disp: 60 tablet, Rfl: 3   diphenhydrAMINE (BENADRYL ALLERGY) 25 mg capsule, Take 1 capsule (25 mg total) by mouth every 6 (six) hours as needed. (Patient not taking: Reported on 05/06/2021), Disp: 30 capsule, Rfl: 0   escitalopram (LEXAPRO) 10 MG tablet, Take 1 tablet (10 mg total) by mouth daily. (Patient taking differently: Take 10 mg by mouth every evening.), Disp: 90 tablet, Rfl: 1   nicotine (NICODERM CQ - DOSED IN MG/24 HOURS) 14 mg/24hr patch, Place 14 mg onto the skin daily as needed (smoking cessation)., Disp: , Rfl:    nitroGLYCERIN (NITROSTAT) 0.4 MG SL tablet, Place 1 tablet (0.4 mg total) under the tongue  every 5 (five) minutes as needed for chest pain., Disp: 25 tablet, Rfl: 12   Omega-3 Fatty Acids (FISH OIL PO), Take 1,200 mg by mouth every evening., Disp: , Rfl:    omeprazole (PRILOSEC) 20 MG capsule, Take 20 mg by mouth daily as needed (indigestion/heartburn.)., Disp: , Rfl:    ticagrelor (BRILINTA) 90 MG TABS tablet, Take 1 tablet (90 mg total) by mouth 2 (two) times daily., Disp: 60 tablet, Rfl: 11  Past Medical History: 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 - Cath: mLAD 20, pRCA 60/70, OM1 99 >> 2.5 x 18 mm Onyx Frontier DES to OM1 // staged PCI 04/03/21: 48 x 3.5 Synergy DES to pRCA; c/b RV branch occlusion and ongoing chest pain, ant STE >> relook cath with patent stents, patent LAD, LM // Echocardiogram 11/22: EF 55-60, no RWMA (difficult windows), Gr 1 DD, normal RVSF // Limited echocardiogram after staged PCI: normal EF and   HLD (hyperlipidemia) 04/04/2021   HTN (hypertension)    Polycythemia    Pre-diabetes 01/04/2017    Tobacco Use: Social History   Tobacco Use  Smoking Status Former   Packs/day: 1.50   Years: 25.00   Pack years: 37.50   Types: Cigarettes   Quit date: 05/07/2021   Years since quitting: 0.0  Smokeless Tobacco Current    Labs: Recent Review Flowsheet Data     Labs for ITP Cardiac and  Pulmonary Rehab Latest Ref Rng & Units 09/01/2016 01/04/2017 04/01/2021 04/02/2021   Cholestrol 0 - 200 mg/dL - - 280(K) 349(Z)   LDLCALC 0 - 99 mg/dL - - 791(T) 056(P)   HDL >40 mg/dL - - 79(Y) 80(X)   Trlycerides <150 mg/dL - - 655(V) 748(O)   Hemoglobin A1c 4.8 - 5.6 % 5.5 5.7 6.1(H) -       Capillary Blood Glucose: Lab Results  Component Value Date   GLUCAP 109 (H) 04/01/2021     Exercise Target Goals: Exercise Program Goal: Individual exercise prescription set using results from initial 6 min walk test and THRR while considering  patients activity barriers and safety.   Exercise  Prescription Goal: Starting with aerobic activity 30 plus minutes a day, 3 days per week for initial exercise prescription. Provide home exercise prescription and guidelines that participant acknowledges understanding prior to discharge.  Activity Barriers & Risk Stratification:  Activity Barriers & Cardiac Risk Stratification - 05/14/21 0852       Activity Barriers & Cardiac Risk Stratification   Activity Barriers Balance Concerns    Cardiac Risk Stratification High             6 Minute Walk:  6 Minute Walk     Row Name 05/14/21 1008         6 Minute Walk   Phase Initial     Distance 1150 feet     Walk Time 6 minutes     # of Rest Breaks 1     MPH 2.2     METS 3.6     RPE 11     VO2 Peak 12.61     Symptoms No     Resting HR 66 bpm     Resting BP 118/60     Resting Oxygen Saturation  98 %     Exercise Oxygen Saturation  during 6 min walk 98 %     Max Ex. HR 72 bpm     Max Ex. BP 120/60     2 Minute Post BP 115/60              Oxygen Initial Assessment:   Oxygen Re-Evaluation:   Oxygen Discharge (Final Oxygen Re-Evaluation):   Initial Exercise Prescription:  Initial Exercise Prescription - 05/14/21 1000       Date of Initial Exercise RX and Referring Provider   Date 05/14/21    Referring Provider Dr. Excell Seltzer    Expected Discharge Date 08/10/21      Treadmill   MPH 1.4    Grade 0    Minutes 17      Elliptical   Level 1    Speed 60    Minutes 22      Prescription Details   Frequency (times per week) 3    Duration Progress to 30 minutes of continuous aerobic without signs/symptoms of physical distress      Intensity   THRR 40-80% of Max Heartrate 70-141    Ratings of Perceived Exertion 11-13    Perceived Dyspnea 0-4      Resistance Training   Training Prescription Yes    Weight 4    Reps 10-15             Perform Capillary Blood Glucose checks as needed.  Exercise Prescription Changes:   Exercise Prescription Changes      Row Name 05/22/21 1259             Response to Exercise  Blood Pressure (Admit) 115/70       Blood Pressure (Exercise) 120/78       Blood Pressure (Exit) 120/65       Heart Rate (Admit) 64 bpm       Heart Rate (Exercise) 81 bpm       Heart Rate (Exit) 67 bpm       Rating of Perceived Exertion (Exercise) 12       Duration Continue with 30 min of aerobic exercise without signs/symptoms of physical distress.       Intensity THRR unchanged         Progression   Progression Continue to progress workloads to maintain intensity without signs/symptoms of physical distress.         Resistance Training   Training Prescription Yes       Weight 5       Reps 10-15       Time 10 Minutes         Treadmill   MPH 1.5       Grade 0       Minutes 17       METs 2.15         Elliptical   Level 2       Speed 25       Minutes 22       METs 1                Exercise Comments:   Exercise Goals and Review:   Exercise Goals     Row Name 05/14/21 1011 05/22/21 1300           Exercise Goals   Increase Physical Activity Yes Yes      Intervention Provide advice, education, support and counseling about physical activity/exercise needs.;Develop an individualized exercise prescription for aerobic and resistive training based on initial evaluation findings, risk stratification, comorbidities and participant's personal goals. Provide advice, education, support and counseling about physical activity/exercise needs.;Develop an individualized exercise prescription for aerobic and resistive training based on initial evaluation findings, risk stratification, comorbidities and participant's personal goals.      Expected Outcomes Short Term: Attend rehab on a regular basis to increase amount of physical activity.;Long Term: Add in home exercise to make exercise part of routine and to increase amount of physical activity.;Long Term: Exercising regularly at least 3-5 days a week. Short Term: Attend  rehab on a regular basis to increase amount of physical activity.;Long Term: Add in home exercise to make exercise part of routine and to increase amount of physical activity.;Long Term: Exercising regularly at least 3-5 days a week.      Increase Strength and Stamina Yes Yes      Intervention Provide advice, education, support and counseling about physical activity/exercise needs.;Develop an individualized exercise prescription for aerobic and resistive training based on initial evaluation findings, risk stratification, comorbidities and participant's personal goals. Provide advice, education, support and counseling about physical activity/exercise needs.;Develop an individualized exercise prescription for aerobic and resistive training based on initial evaluation findings, risk stratification, comorbidities and participant's personal goals.      Expected Outcomes Short Term: Increase workloads from initial exercise prescription for resistance, speed, and METs.;Short Term: Perform resistance training exercises routinely during rehab and add in resistance training at home;Long Term: Improve cardiorespiratory fitness, muscular endurance and strength as measured by increased METs and functional capacity ( ) Short Term: Increase workloads from initial exercise prescription for resistance, speed, and METs.;Short Term: Perform resistance training exercises routinely during  rehab and add in resistance training at home;Long Term: Improve cardiorespiratory fitness, muscular endurance and strength as measured by increased METs and functional capacity ( )      Able to understand and use rate of perceived exertion (RPE) scale Yes Yes      Intervention Provide education and explanation on how to use RPE scale Provide education and explanation on how to use RPE scale      Expected Outcomes Short Term: Able to use RPE daily in rehab to express subjective intensity level;Long Term:  Able to use RPE to guide intensity  level when exercising independently Short Term: Able to use RPE daily in rehab to express subjective intensity level;Long Term:  Able to use RPE to guide intensity level when exercising independently      Knowledge and understanding of Target Heart Rate Range (THRR) Yes Yes      Intervention Provide education and explanation of THRR including how the numbers were predicted and where they are located for reference Provide education and explanation of THRR including how the numbers were predicted and where they are located for reference      Expected Outcomes Short Term: Able to state/look up THRR;Long Term: Able to use THRR to govern intensity when exercising independently;Short Term: Able to use daily as guideline for intensity in rehab Short Term: Able to state/look up THRR;Long Term: Able to use THRR to govern intensity when exercising independently;Short Term: Able to use daily as guideline for intensity in rehab      Able to check pulse independently Yes Yes      Intervention Provide education and demonstration on how to check pulse in carotid and radial arteries.;Review the importance of being able to check your own pulse for safety during independent exercise Provide education and demonstration on how to check pulse in carotid and radial arteries.;Review the importance of being able to check your own pulse for safety during independent exercise      Expected Outcomes Short Term: Able to explain why pulse checking is important during independent exercise;Long Term: Able to check pulse independently and accurately Short Term: Able to explain why pulse checking is important during independent exercise;Long Term: Able to check pulse independently and accurately      Understanding of Exercise Prescription Yes Yes      Intervention Provide education, explanation, and written materials on patient's individual exercise prescription Provide education, explanation, and written materials on patient's individual  exercise prescription      Expected Outcomes Short Term: Able to explain program exercise prescription;Long Term: Able to explain home exercise prescription to exercise independently Short Term: Able to explain program exercise prescription;Long Term: Able to explain home exercise prescription to exercise independently               Exercise Goals Re-Evaluation :  Exercise Goals Re-Evaluation     Row Name 05/22/21 1301             Exercise Goal Re-Evaluation   Exercise Goals Review Increase Physical Activity;Increase Strength and Stamina;Able to understand and use rate of perceived exertion (RPE) scale;Knowledge and understanding of Target Heart Rate Range (THRR);Able to check pulse independently;Understanding of Exercise Prescription       Comments Pt has completed sessions of cardiac rehab. He is motivated during class. He could push himslef harded during class with his workload. He is currently exercising at 2.15 METs on the TM. Will continue to montior and progress as able.       Expected Outcomes  Through exercise at rehab and at home, the patient will meet their stated goals.                 Discharge Exercise Prescription (Final Exercise Prescription Changes):  Exercise Prescription Changes - 05/22/21 1259       Response to Exercise   Blood Pressure (Admit) 115/70    Blood Pressure (Exercise) 120/78    Blood Pressure (Exit) 120/65    Heart Rate (Admit) 64 bpm    Heart Rate (Exercise) 81 bpm    Heart Rate (Exit) 67 bpm    Rating of Perceived Exertion (Exercise) 12    Duration Continue with 30 min of aerobic exercise without signs/symptoms of physical distress.    Intensity THRR unchanged      Progression   Progression Continue to progress workloads to maintain intensity without signs/symptoms of physical distress.      Resistance Training   Training Prescription Yes    Weight 5    Reps 10-15    Time 10 Minutes      Treadmill   MPH 1.5    Grade 0     Minutes 17    METs 2.15      Elliptical   Level 2    Speed 25    Minutes 22    METs 1             Nutrition:  Target Goals: Understanding of nutrition guidelines, daily intake of sodium 1500mg , cholesterol 200mg , calories 30% from fat and 7% or less from saturated fats, daily to have 5 or more servings of fruits and vegetables.  Biometrics:  Pre Biometrics - 05/14/21 1012       Pre Biometrics   Height 5\' 11"  (1.803 m)    Weight 98.9 kg    Waist Circumference 41 inches    Hip Circumference 42 inches    Waist to Hip Ratio 0.98 %    BMI (Calculated) 30.42    Triceps Skinfold 5 mm    % Body Fat 23.5 %    Grip Strength 53.6 kg    Flexibility 14 in    Single Leg Stand 30 seconds              Nutrition Therapy Plan and Nutrition Goals:  Nutrition Therapy & Goals - 05/14/21 0931       Personal Nutrition Goals   Comments Patient scored 42 on his diet assessment. Handout provided and explained regarding healthier choices. Score also discussed. We offer 2 educational sessions on  heart healthy nutrition with handouts and assistance with RD referral if patient is interested. Patient verbaized understanding.      Intervention Plan   Intervention Nutrition handout(s) given to patient.    Expected Outcomes Short Term Goal: Understand basic principles of dietary content, such as calories, fat, sodium, cholesterol and nutrients.             Nutrition Assessments:  Nutrition Assessments - 05/14/21 0933       MEDFICTS Scores   Pre Score 42            MEDIFICTS Score Key: ?70 Need to make dietary changes  40-70 Heart Healthy Diet ? 40 Therapeutic Level Cholesterol Diet   Picture Your Plate Scores: <09<40 Unhealthy dietary pattern with much room for improvement. 41-50 Dietary pattern unlikely to meet recommendations for good health and room for improvement. 51-60 More healthful dietary pattern, with some room for improvement.  >60 Healthy dietary pattern,  although there may  be some specific behaviors that could be improved.    Nutrition Goals Re-Evaluation:   Nutrition Goals Discharge (Final Nutrition Goals Re-Evaluation):   Psychosocial: Target Goals: Acknowledge presence or absence of significant depression and/or stress, maximize coping skills, provide positive support system. Participant is able to verbalize types and ability to use techniques and skills needed for reducing stress and depression.  Initial Review & Psychosocial Screening:  Initial Psych Review & Screening - 05/14/21 0939       Initial Review   Current issues with Current Anxiety/Panic      Family Dynamics   Good Support System? Yes      Barriers   Psychosocial barriers to participate in program The patient should benefit from training in stress management and relaxation.;Psychosocial barriers identified (see note)      Screening Interventions   Interventions Encouraged to exercise;Provide feedback about the scores to participant    Expected Outcomes Short Term goal: Identification and review with participant of any Quality of Life or Depression concerns found by scoring the questionnaire.             Quality of Life Scores:  Quality of Life - 05/14/21 1013       Quality of Life   Select Quality of Life      Quality of Life Scores   Health/Function Pre 15.47 %    Socioeconomic Pre 19.5 %    Psych/Spiritual Pre 20.36 %    Family Pre 22.4 %    GLOBAL Pre 18.32 %            Scores of 19 and below usually indicate a poorer quality of life in these areas.  A difference of  2-3 points is a clinically meaningful difference.  A difference of 2-3 points in the total score of the Quality of Life Index has been associated with significant improvement in overall quality of life, self-image, physical symptoms, and general health in studies assessing change in quality of life.  PHQ-9: Recent Review Flowsheet Data     Depression screen [name] 2/9 05/14/2021  05/05/2021 05/05/2021 02/16/2017 01/04/2017   Decreased Interest 1 0 0 0 0   Down, Depressed, Hopeless 0 0 0 0 0   PHQ - 2 Score 1 0 0 0 0   Altered sleeping 2 1 - - -   Tired, decreased energy 1 1 - - -   Change in appetite 1 0 - - -   Feeling bad or failure about yourself  1 0 - - -   Trouble concentrating 0 0 - - -   Moving slowly or fidgety/restless 0 0 - - -   Suicidal thoughts 0 0 - - -   PHQ-9 Score 6 2 - - -   Difficult doing work/chores Somewhat difficult Not difficult at all - - -      Interpretation of Total Score  Total Score Depression Severity:  1-4 = Minimal depression, 5-9 = Mild depression, 10-14 = Moderate depression, 15-19 = Moderately severe depression, 20-27 = Severe depression   Psychosocial Evaluation and Intervention:  Psychosocial Evaluation - 05/14/21 0941       Psychosocial Evaluation & Interventions   Interventions Stress management education;Relaxation education;Encouraged to exercise with the program and follow exercise prescription    Comments Patient has anxiety which he says he believes he has had a long time but he was able to manage. Since his STEMI/DES in November, his anxiety has increased to the point he is not able to  manage it and wearing a mask increases his anxiety which may be a barrier to partiicpate in CR. He says he wants to try it and see if he can. His pcp started him on Lexapro a few days ago to help manage his anxiety. He says it may have improved some but he understands it will take 2 to 4 weeks for Lexapro to make a difference. His PHQ-9 score was 6 and his overall QOL was 18.32% socring lowest in health/function at 15.47%. 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 He believes when he is able to get back to work full time he will be able to manage the anxiety better. He has also recently quit smoking 05/07/21 which is increasing his anxiety. He has 7 children from 27 to 45 years old. His wife is a Engineer, civil (consulting)nurse and he  names her as his support person. He wants to try the program hoping it will get him back to normal and help get his anxierty better managed.    Expected Outcomes Patient's anxiety will be managed better with medication and exercise and he will be able to exercise wearing a mask.    Continue Psychosocial Services  Follow up required by staff             Psychosocial Re-Evaluation:  Psychosocial Re-Evaluation     Row Name 05/15/21 0715             Psychosocial Re-Evaluation   Current issues with Current Anxiety/Panic       Comments Patient new to the progam and plans to start 05/20/21. We will continue to monitor his progress in the program.       Expected Outcomes Patient will continue to have no psychosocial barriers identified and his anxiety will be managed.       Interventions Stress management education;Encouraged to attend Cardiac Rehabilitation for the exercise;Relaxation education       Continue Psychosocial Services  No Follow up required                Psychosocial Discharge (Final Psychosocial Re-Evaluation):  Psychosocial Re-Evaluation - 05/15/21 0715       Psychosocial Re-Evaluation   Current issues with Current Anxiety/Panic    Comments Patient new to the progam and plans to start 05/20/21. We will continue to monitor his progress in the program.    Expected Outcomes Patient will continue to have no psychosocial barriers identified and his anxiety will be managed.    Interventions Stress management education;Encouraged to attend Cardiac Rehabilitation for the exercise;Relaxation education    Continue Psychosocial Services  No Follow up required             Vocational Rehabilitation: Provide vocational rehab assistance to qualifying candidates.   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 He does not need vocational rehab.             Education: Education Goals: Education classes will be provided on a weekly basis, covering required topics. Participant will state understanding/return demonstration of topics presented.  Learning Barriers/Preferences:  Learning Barriers/Preferences - 05/14/21 0935       Learning Barriers/Preferences   Learning Barriers None    Learning  Preferences Written Material             Education Topics: Hypertension, Hypertension Reduction -Define heart disease and high blood pressure. Discus how high blood pressure affects the body and ways to reduce high blood pressure.   Exercise and Your Heart -Discuss why it is important to exercise, the FITT principles of exercise, normal and abnormal responses to exercise, and how to exercise safely.   Angina -Discuss definition of angina, causes of angina, treatment of angina, and how to decrease risk of having angina.   Cardiac Medications -Review what the following cardiac medications are used for, how they affect the body, and side effects that may occur when taking the medications.  Medications include Aspirin, Beta blockers, calcium channel blockers, ACE Inhibitors, angiotensin receptor blockers, diuretics, digoxin, and antihyperlipidemics.   Congestive Heart Failure -Discuss the definition of CHF, how to live with CHF, the signs and symptoms of CHF, and how keep track of weight and sodium intake.   Heart Disease and Intimacy -Discus the effect sexual activity has on the heart, how changes occur during intimacy as we age, and safety during sexual activity.   Smoking Cessation / COPD -Discuss different methods to quit smoking, the health benefits of quitting smoking, and the definition of COPD.   Nutrition I: Fats -Discuss the types of cholesterol, what cholesterol does  to the heart, and how cholesterol levels can be controlled.   Nutrition II: Labels -Discuss the different components of food labels and how to read food label   Heart Parts/Heart Disease and PAD -Discuss the anatomy of the heart, the pathway of blood circulation through the heart, and these are affected by heart disease.   Stress I: Signs and Symptoms -Discuss the causes of stress, how stress may lead to anxiety and depression, and ways to limit stress.   Stress II: Relaxation -Discuss different types of relaxation techniques to limit stress. Flowsheet Row CARDIAC REHAB PHASE II EXERCISE from 05/27/2021 in [place] CARDIAC REHABILITATION  Date 05/20/21  Educator hj  Instruction Review Code 2- Demonstrated Understanding       Warning Signs of Stroke / TIA -Discuss definition of a stroke, what the signs and symptoms are of a stroke, and how to identify when someone is having stroke. Flowsheet Row CARDIAC REHAB PHASE II EXERCISE from 05/27/2021 in [place] CARDIAC REHABILITATION  Date 05/27/21  Educator pb  Instruction Review Code 1- Verbalizes Understanding       Knowledge Questionnaire Score:  Knowledge Questionnaire Score - 05/14/21 0933       Knowledge Questionnaire Score   Pre Score 23/28             Core Components/Risk Factors/Patient Goals at Admission:  Personal Goals and Risk Factors at Admission - 05/14/21 0936       Core Components/Risk Factors/Patient Goals on Admission    Weight Management Obesity    Lipids Yes    Intervention Provide education and support for participant on nutrition & aerobic/resistive exercise along with prescribed medications to achieve LDL 70mg , HDL >40mg .    Expected Outcomes Short Term: Participant states understanding of desired cholesterol values and is compliant with medications prescribed. Participant is following exercise prescription and nutrition guidelines.;Long Term: Cholesterol controlled with medications as  prescribed, with individualized exercise RX and with personalized nutrition plan. Value goals: LDL < 70mg , HDL > 40 mg.    Personal Goal Other Yes    Personal Goal Patient wants to get back to normal; get back to  work; and be able to manage his anxiety.    Intervention Patient will attend CR 3 days/week with exercise and education and supplement with exercise at home.    Expected Outcomes Patient will complete the program meeting both program and personal goals.             Core Components/Risk Factors/Patient Goals Review:   Goals and Risk Factor Review     Row Name 05/15/21 0716             Core Components/Risk Factors/Patient Goals Review   Personal Goals Review Weight Management/Obesity;Lipids;Other       Review Patient referred to CR with [facility]. He has multiple risk factors for CAD and is participating in the program for risk modification. He plans to start the program 05/20/21. His personal goals for the program are to return to his normal life; be able to return to work and get stronger. We will monitor his progress as he works towards meeting these goals.       Expected Outcomes Patient will complete the program meeting both personal and program goals.                Core Components/Risk Factors/Patient Goals at Discharge (Final Review):   Goals and Risk Factor Review - 05/15/21 0716       Core Components/Risk Factors/Patient Goals Review   Personal Goals Review Weight Management/Obesity;Lipids;Other    Review Patient referred to CR with [facility]. He has multiple risk factors for CAD and is participating in the program for risk modification. He plans to start the program 05/20/21. His personal goals for the program are to return to his normal life; be able to return to work and get stronger. We will monitor his progress as he works towards meeting these goals.    Expected Outcomes Patient will complete the program meeting both personal and program goals.              ITP Comments:   Comments: ITP REVIEW Pt is making expected progress toward Cardiac Rehab goals after completing 4 sessions. Recommend continued exercise, life style modification, education, and increased stamina and strength.

## 2021-05-29 ENCOUNTER — Encounter (HOSPITAL_COMMUNITY)
Admission: RE | Admit: 2021-05-29 | Discharge: 2021-05-29 | Disposition: A | Discharge status: Home / Self Care | Payer: BC Managed Care – PPO | Source: Ambulatory Visit | Attending: Cardiovascular Disease | Admitting: Cardiovascular Disease

## 2021-05-29 DIAGNOSIS — Z955 Presence of coronary angioplasty implant and graft: Secondary | ICD-10-CM

## 2021-05-29 DIAGNOSIS — I213 ST elevation (STEMI) myocardial infarction of unspecified site: Secondary | ICD-10-CM

## 2021-05-29 NOTE — Progress Notes (Signed)
Daily Session Note  Patient Details  Name: Corey Robinson MRN: [id number] Date of Birth: May 05, 1976 Referring Provider:   [name] from 05/14/2021 in [place]  Referring Provider Dr. Burt Knack       Encounter Date: 05/29/2021  Check In:  Session Check In - 05/29/21 0930       Check-In   Supervising physician immediately available to respond to emergencies [facility] MD immediately available    Physician(s) Dr. Harl Bowie    Location [facility]    Staff Present Hoy Register, MS, ACSM-CEP, Exercise Physiologist;Khiem Gargis Wynetta Emery, RN, BSN;Other    Virtual Visit No    Medication changes reported     No    Fall or balance concerns reported    Yes    Comments Patient states he loses his balance frequently.    Tobacco Cessation No Change    Warm-up and Cool-down Performed as group-led instruction    Resistance Training Performed Yes    VAD Patient? No    PAD/SET Patient? No      Pain Assessment   Currently in Pain? No/denies    Pain Score 0-No pain    Multiple Pain Sites No             Capillary Blood Glucose: No results found for this or any previous visit (from the past 24 hour(s)).    Social History   Tobacco Use  Smoking Status Former   Packs/day: 1.50   Years: 25.00   Pack years: 37.50   Types: Cigarettes   Quit date: 05/07/2021   Years since quitting: 0.0  Smokeless Tobacco Current    Goals Met:  Independence with exercise equipment Exercise tolerated well No report of concerns or symptoms today Strength training completed today  Goals Unmet:  Not Applicable  Comments: Check out 1030   Dr. Kathie Dike is Medical Director for [name] Pulmonary Rehab.

## 2021-06-01 ENCOUNTER — Encounter (HOSPITAL_COMMUNITY)
Admission: RE | Admit: 2021-06-01 | Discharge: 2021-06-01 | Disposition: A | Discharge status: Home / Self Care | Payer: BC Managed Care – PPO | Source: Ambulatory Visit | Attending: Cardiovascular Disease | Admitting: Cardiovascular Disease

## 2021-06-01 DIAGNOSIS — I213 ST elevation (STEMI) myocardial infarction of unspecified site: Secondary | ICD-10-CM

## 2021-06-01 DIAGNOSIS — Z955 Presence of coronary angioplasty implant and graft: Secondary | ICD-10-CM | POA: Diagnosis not present

## 2021-06-01 NOTE — Progress Notes (Signed)
Daily Session Note  Patient Details  Name: Corey Robinson MRN: [id number] Date of Birth: [birth year] Referring Provider:   [name] from 05/14/2021 in [place]  Referring Provider Dr. Burt Knack       Encounter Date: 06/01/2021  Check In:  Session Check In - 06/01/21 0930       Check-In   Supervising physician immediately available to respond to emergencies [facility] MD immediately available    Physician(s) Dr. Harrington Challenger    Location [facility]    Staff Present Hoy Register, MS, ACSM-CEP, Exercise Physiologist;Reality Dejonge Otho Ket, BS, Exercise Physiologist;Debra Wynetta Emery, RN, BSN    Virtual Visit No    Medication changes reported     No    Fall or balance concerns reported    No    Tobacco Cessation No Change    Warm-up and Cool-down Performed as group-led instruction    Resistance Training Performed Yes    VAD Patient? No    PAD/SET Patient? No      Pain Assessment   Currently in Pain? No/denies    Pain Score 0-No pain    Multiple Pain Sites No             Capillary Blood Glucose: No results found for this or any previous visit (from the past 24 hour(s)).    Social History   Tobacco Use  Smoking Status Former   Packs/day: 1.50   Years: 25.00   Pack years: 37.50   Types: Cigarettes   Quit date: 05/07/2021   Years since quitting: 0.0  Smokeless Tobacco Current    Goals Met:  Independence with exercise equipment Exercise tolerated well No report of concerns or symptoms today Strength training completed today  Goals Unmet:  Not Applicable  Comments: check out 1030   Dr. Kathie Dike is Medical Director for [facility] Pulmonary Rehab.

## 2021-06-03 ENCOUNTER — Encounter (HOSPITAL_COMMUNITY)
Admission: RE | Admit: 2021-06-03 | Discharge: 2021-06-03 | Disposition: A | Discharge status: Home / Self Care | Payer: BC Managed Care – PPO | Source: Ambulatory Visit | Attending: Cardiovascular Disease | Admitting: Cardiovascular Disease

## 2021-06-03 DIAGNOSIS — I213 ST elevation (STEMI) myocardial infarction of unspecified site: Secondary | ICD-10-CM | POA: Diagnosis not present

## 2021-06-03 DIAGNOSIS — Z955 Presence of coronary angioplasty implant and graft: Secondary | ICD-10-CM | POA: Diagnosis not present

## 2021-06-03 NOTE — Progress Notes (Signed)
Daily Session Note  Patient Details  Name: Corey Robinson MRN: [id number] Date of Birth: 16-Nov-1975 Referring Provider:   [name] from 05/14/2021 in [place]  Referring Provider Dr. Burt Knack       Encounter Date: 06/03/2021  Check In:  Session Check In - 06/03/21 0930       Check-In   Supervising physician immediately available to respond to emergencies [facility] immediately available    Physician(s) Dr. Gardiner Rhyme    Location [facility]    Staff Present Hoy Register, MS, ACSM-CEP, Exercise Physiologist;Heather Otho Ket, BS, Exercise Physiologist;Debra Wynetta Emery, RN, BSN    Virtual Visit No    Medication changes reported     No    Fall or balance concerns reported    No    Tobacco Cessation No Change    Warm-up and Cool-down Performed as group-led instruction    Resistance Training Performed Yes    VAD Patient? No    PAD/SET Patient? No      Pain Assessment   Currently in Pain? No/denies    Pain Score 0-No pain    Multiple Pain Sites No             Capillary Blood Glucose: No results found for this or any previous visit (from the past 24 hour(s)).    Social History   Tobacco Use  Smoking Status Former   Packs/day: 1.50   Years: 25.00   Pack years: 37.50   Types: Cigarettes   Quit date: 05/07/2021   Years since quitting: 0.0  Smokeless Tobacco Current    Goals Met:  Independence with exercise equipment Exercise tolerated well No report of concerns or symptoms today Strength training completed today  Goals Unmet:  Not Applicable  Comments: checkout time is 1030   Dr. Kathie Dike is Medical Director for [facility] Pulmonary Rehab.

## 2021-06-05 ENCOUNTER — Encounter (HOSPITAL_COMMUNITY)
Admission: RE | Admit: 2021-06-05 | Discharge: 2021-06-05 | Disposition: A | Discharge status: Home / Self Care | Payer: BC Managed Care – PPO | Source: Ambulatory Visit | Attending: Cardiovascular Disease | Admitting: Cardiovascular Disease

## 2021-06-05 DIAGNOSIS — I213 ST elevation (STEMI) myocardial infarction of unspecified site: Secondary | ICD-10-CM | POA: Diagnosis not present

## 2021-06-05 DIAGNOSIS — Z955 Presence of coronary angioplasty implant and graft: Secondary | ICD-10-CM

## 2021-06-05 NOTE — Progress Notes (Signed)
Daily Session Note  Patient Details  Name: Corey Robinson MRN: [id number] Date of Birth: [birth year] Referring Provider:   [name] from 05/14/2021 in [place]  Referring Provider Dr. Burt Knack       Encounter Date: 06/05/2021  Check In:  Session Check In - 06/05/21 0930       Check-In   Supervising physician immediately available to respond to emergencies [facility] immediately available    Physician(s) Dr. Domenic Polite    Location [facility]    Staff Present Hoy Register, MS, ACSM-CEP, Exercise Physiologist;Heather Otho Ket, BS, Exercise Physiologist;Debra Wynetta Emery, RN, BSN    Virtual Visit No    Medication changes reported     No    Fall or balance concerns reported    No    Tobacco Cessation No Change    Warm-up and Cool-down Performed as group-led instruction    Resistance Training Performed Yes    VAD Patient? No    PAD/SET Patient? No      Pain Assessment   Currently in Pain? No/denies    Pain Score 0-No pain    Multiple Pain Sites No             Capillary Blood Glucose: No results found for this or any previous visit (from the past 24 hour(s)).    Social History   Tobacco Use  Smoking Status Former   Packs/day: 1.50   Years: 25.00   Pack years: 37.50   Types: Cigarettes   Quit date: 05/07/2021   Years since quitting: 0.0  Smokeless Tobacco Current    Goals Met:  Independence with exercise equipment Exercise tolerated well No report of concerns or symptoms today Strength training completed today  Goals Unmet:  Not Applicable  Comments: checkout time is 1030   Dr. Kathie Dike is Medical Director for [facility] Pulmonary Rehab.

## 2021-06-08 ENCOUNTER — Encounter (HOSPITAL_COMMUNITY)
Admission: RE | Admit: 2021-06-08 | Discharge: 2021-06-08 | Disposition: A | Discharge status: Home / Self Care | Payer: BC Managed Care – PPO | Source: Ambulatory Visit | Attending: Cardiovascular Disease | Admitting: Cardiovascular Disease

## 2021-06-08 ENCOUNTER — Other Ambulatory Visit: Payer: Self-pay

## 2021-06-08 VITALS — Wt 220.2 lb

## 2021-06-08 DIAGNOSIS — I213 ST elevation (STEMI) myocardial infarction of unspecified site: Secondary | ICD-10-CM | POA: Diagnosis not present

## 2021-06-08 DIAGNOSIS — Z955 Presence of coronary angioplasty implant and graft: Secondary | ICD-10-CM

## 2021-06-08 NOTE — Progress Notes (Signed)
Daily Session Note  Patient Details  Name: Corey Robinson MRN: [id number] Date of Birth: 1975/06/10 Referring Provider:   [name] from 05/14/2021 in [place]  Referring Provider Dr. Burt Knack       Encounter Date: 06/08/2021  Check In:  Session Check In - 06/08/21 0930       Check-In   Supervising physician immediately available to respond to emergencies [facility] MD immediately available    Physician(s) Dr. Harl Bowie    Location [facility]    Staff Present Hoy Register, MS, ACSM-CEP, Exercise Physiologist;Heather Zigmund Daniel, Exercise Physiologist;Debra Wynetta Emery, RN, BSN    Virtual Visit No    Medication changes reported     No    Fall or balance concerns reported    No    Tobacco Cessation No Change    Warm-up and Cool-down Performed as group-led instruction    Resistance Training Performed Yes    VAD Patient? No    PAD/SET Patient? No      Pain Assessment   Currently in Pain? No/denies    Pain Score 0-No pain    Multiple Pain Sites No             Capillary Blood Glucose: No results found for this or any previous visit (from the past 24 hour(s)).    Social History   Tobacco Use  Smoking Status Former   Packs/day: 1.50   Years: 25.00   Pack years: 37.50   Types: Cigarettes   Quit date: 05/07/2021   Years since quitting: 0.0  Smokeless Tobacco Current    Goals Met:  Independence with exercise equipment Exercise tolerated well No report of concerns or symptoms today Strength training completed today  Goals Unmet:  Not Applicable  Comments: checkout time is 1030   Dr. Kathie Dike is Medical Director for [facility] Pulmonary Rehab.

## 2021-06-10 ENCOUNTER — Encounter (HOSPITAL_COMMUNITY): Payer: BC Managed Care – PPO

## 2021-06-12 ENCOUNTER — Encounter (HOSPITAL_COMMUNITY)
Admission: RE | Admit: 2021-06-12 | Discharge: 2021-06-12 | Disposition: A | Discharge status: Home / Self Care | Payer: BC Managed Care – PPO | Source: Ambulatory Visit | Attending: Cardiovascular Disease | Admitting: Cardiovascular Disease

## 2021-06-12 DIAGNOSIS — I213 ST elevation (STEMI) myocardial infarction of unspecified site: Secondary | ICD-10-CM | POA: Diagnosis not present

## 2021-06-12 DIAGNOSIS — Z955 Presence of coronary angioplasty implant and graft: Secondary | ICD-10-CM

## 2021-06-12 NOTE — Progress Notes (Signed)
Daily Session Note  Patient Details  Name: Corey Robinson MRN: [id number] Date of Birth: [birth year] Referring Provider:   [name] from 05/14/2021 in [place]  Referring Provider Dr. Burt Knack       Encounter Date: 06/12/2021  Check In:  Session Check In - 06/12/21 0930       Check-In   Supervising physician immediately available to respond to emergencies [facility] immediately available    Physician(s) Dr. Audie Box    Location [facility]    Staff Present Hoy Register, MS, ACSM-CEP, Exercise Physiologist;Heather Zigmund Daniel, Exercise Physiologist    Virtual Visit No    Medication changes reported     No    Fall or balance concerns reported    No    Tobacco Cessation No Change    Warm-up and Cool-down Performed as group-led instruction    Resistance Training Performed Yes    VAD Patient? No    PAD/SET Patient? No      Pain Assessment   Currently in Pain? No/denies    Pain Score 0-No pain    Multiple Pain Sites No             Capillary Blood Glucose: No results found for this or any previous visit (from the past 24 hour(s)).    Social History   Tobacco Use  Smoking Status Former   Packs/day: 1.50   Years: 25.00   Pack years: 37.50   Types: Cigarettes   Quit date: 05/07/2021   Years since quitting: 0.0  Smokeless Tobacco Current    Goals Met:  Independence with exercise equipment Exercise tolerated well No report of concerns or symptoms today Strength training completed today  Goals Unmet:  Not Applicable  Comments: check out @ 10:30am   Dr. Kathie Dike is Medical Director for [facility] Pulmonary Rehab.

## 2021-06-15 ENCOUNTER — Other Ambulatory Visit: Payer: Self-pay

## 2021-06-15 ENCOUNTER — Encounter (HOSPITAL_COMMUNITY)
Admission: RE | Admit: 2021-06-15 | Discharge: 2021-06-15 | Disposition: A | Discharge status: Home / Self Care | Payer: BC Managed Care – PPO | Source: Ambulatory Visit | Attending: Cardiovascular Disease | Admitting: Cardiovascular Disease

## 2021-06-15 DIAGNOSIS — Z955 Presence of coronary angioplasty implant and graft: Secondary | ICD-10-CM

## 2021-06-15 DIAGNOSIS — I213 ST elevation (STEMI) myocardial infarction of unspecified site: Secondary | ICD-10-CM | POA: Diagnosis not present

## 2021-06-15 MED ORDER — AMLODIPINE BESYLATE 5 MG PO TABS: 10.0000 mg | ORAL_TABLET | Freq: Every day | ORAL | 3 refills | Status: DC

## 2021-06-15 NOTE — Progress Notes (Signed)
Daily Session Note  Patient Details  Name: Corey Robinson MRN: [id number] Date of Birth: [birth year] Referring Provider:   [name] from 05/14/2021 in [place]  Referring Provider Dr. Burt Knack       Encounter Date: 06/15/2021  Check In:  Session Check In - 06/15/21 0930       Check-In   Supervising physician immediately available to respond to emergencies [facility] MD immediately available    Physician(s) Dr. Radford Pax    Location [facility]    Staff Present Hoy Register, MS, ACSM-CEP, Exercise Physiologist;Debra Wynetta Emery, RN, BSN;Other    Virtual Visit No    Medication changes reported     No    Fall or balance concerns reported    No    Tobacco Cessation No Change    Warm-up and Cool-down Performed as group-led instruction    Resistance Training Performed Yes    VAD Patient? No    PAD/SET Patient? No      Pain Assessment   Currently in Pain? No/denies    Pain Score 0-No pain    Multiple Pain Sites No             Capillary Blood Glucose: No results found for this or any previous visit (from the past 24 hour(s)).    Social History   Tobacco Use  Smoking Status Former   Packs/day: 1.50   Years: 25.00   Pack years: 37.50   Types: Cigarettes   Quit date: 05/07/2021   Years since quitting: 0.1  Smokeless Tobacco Current    Goals Met:  Independence with exercise equipment Exercise tolerated well No report of concerns or symptoms today Strength training completed today  Goals Unmet:  Not Applicable  Comments: checkout time is 1030   Dr. Kathie Dike is Medical Director for [facility] Pulmonary Rehab.

## 2021-06-16 ENCOUNTER — Ambulatory Visit: Payer: BLUE CROSS/BLUE SHIELD | Admitting: Family Medicine

## 2021-06-16 ENCOUNTER — Other Ambulatory Visit: Payer: Self-pay

## 2021-06-16 ENCOUNTER — Telehealth: Payer: Self-pay | Admitting: *Deleted

## 2021-06-16 ENCOUNTER — Other Ambulatory Visit: Payer: BC Managed Care – PPO | Admitting: *Deleted

## 2021-06-16 DIAGNOSIS — I10 Essential (primary) hypertension: Secondary | ICD-10-CM | POA: Diagnosis not present

## 2021-06-16 DIAGNOSIS — E782 Mixed hyperlipidemia: Secondary | ICD-10-CM | POA: Diagnosis not present

## 2021-06-16 DIAGNOSIS — I2119 ST elevation (STEMI) myocardial infarction involving other coronary artery of inferior wall: Secondary | ICD-10-CM | POA: Diagnosis not present

## 2021-06-16 LAB — COMPREHENSIVE METABOLIC PANEL
ALT: 44 IU/L (ref 0–44)
AST: 31 IU/L (ref 0–40)
Albumin/Globulin Ratio: 2.1 (ref 1.2–2.2)
Albumin: 4.7 g/dL (ref 4.0–5.0)
Alkaline Phosphatase: 104 IU/L (ref 44–121)
BUN/Creatinine Ratio: 14 (ref 9–20)
BUN: 13 mg/dL (ref 6–24)
Bilirubin Total: 0.4 mg/dL (ref 0.0–1.2)
CO2: 24 mmol/L (ref 20–29)
Calcium: 8.6 mg/dL — ABNORMAL LOW (ref 8.7–10.2)
Chloride: 104 mmol/L (ref 96–106)
Creatinine, Ser: 0.95 mg/dL (ref 0.76–1.27)
Globulin, Total: 2.2 g/dL (ref 1.5–4.5)
Glucose: 102 mg/dL — ABNORMAL HIGH (ref 70–99)
Potassium: 4.5 mmol/L (ref 3.5–5.2)
Sodium: 140 mmol/L (ref 134–144)
Total Protein: 6.9 g/dL (ref 6.0–8.5)
eGFR: 101 mL/min/{1.73_m2} (ref >59)

## 2021-06-16 LAB — LIPID PANEL
Chol/HDL Ratio: 3.5 ratio (ref 0.0–5.0)
Cholesterol, Total: 104 mg/dL (ref 100–199)
HDL: 30 mg/dL — ABNORMAL LOW (ref >39)
LDL Chol Calc (NIH): 53 mg/dL (ref 0–99)
Triglycerides: 117 mg/dL (ref 0–149)
VLDL Cholesterol Cal: 21 mg/dL (ref 5–40)

## 2021-06-16 NOTE — Telephone Encounter (Signed)
Received 2 disability forms for patient from Medical records.  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 This was discussed by Richardson Dopp, PA-C at his last visit that he would return full duty 3 months after event.

## 2021-06-16 NOTE — Telephone Encounter (Signed)
Completed, signed forms received back from Dr. Excell Seltzer.  Given to Medical Records to scan and provide to pt.

## 2021-06-17 ENCOUNTER — Telehealth: Payer: Self-pay | Admitting: Cardiovascular Disease

## 2021-06-17 ENCOUNTER — Encounter (HOSPITAL_COMMUNITY)
Admission: RE | Admit: 2021-06-17 | Discharge: 2021-06-17 | Disposition: A | Discharge status: Home / Self Care | Payer: BC Managed Care – PPO | Source: Ambulatory Visit | Attending: Cardiovascular Disease | Admitting: Cardiovascular Disease

## 2021-06-17 DIAGNOSIS — I213 ST elevation (STEMI) myocardial infarction of unspecified site: Secondary | ICD-10-CM | POA: Diagnosis not present

## 2021-06-17 DIAGNOSIS — Z955 Presence of coronary angioplasty implant and graft: Secondary | ICD-10-CM | POA: Diagnosis not present

## 2021-06-17 NOTE — Telephone Encounter (Signed)
Form completed by Dr. Excell Seltzer on 06/16/2021. Patient has been notified that the forms are ready for pick up on 06/17/2021 JMM.

## 2021-06-17 NOTE — Progress Notes (Signed)
Daily Session Note  Patient Details  Name: Corey Robinson MRN: [id number] Date of Birth: 01-25-1976 Referring Provider:   [name] from 05/14/2021 in [place]  Referring Provider Dr. Burt Knack       Encounter Date: 06/17/2021  Check In:  Session Check In - 06/17/21 0930       Check-In   Supervising physician immediately available to respond to emergencies [facility] MD immediately available    Physician(s) Dr. Domenic Polite    Location [facility]    Staff Present Hoy Register, MS, ACSM-CEP, Exercise Physiologist;Debra Wynetta Emery, RN, BSN;Other;Amazin Pincock, RN;Heather Otho Ket, BS, Exercise Physiologist    Virtual Visit No    Medication changes reported     No    Fall or balance concerns reported    No    Comments Patient states he loses his balance frequently.    Tobacco Cessation No Change    Warm-up and Cool-down Performed as group-led instruction    Resistance Training Performed Yes    VAD Patient? No    PAD/SET Patient? No      Pain Assessment   Currently in Pain? No/denies    Pain Score 0-No pain    Multiple Pain Sites No             Capillary Blood Glucose: No results found for this or any previous visit (from the past 24 hour(s)).    Social History   Tobacco Use  Smoking Status Former   Packs/day: 1.50   Years: 25.00   Pack years: 37.50   Types: Cigarettes   Quit date: 05/07/2021   Years since quitting: 0.1  Smokeless Tobacco Current    Goals Met:  Independence with exercise equipment Exercise tolerated well No report of concerns or symptoms today Strength training completed today  Goals Unmet:  Not Applicable  Comments: check out 10:30am   Dr. Kathie Dike is Medical Director for [facility] Pulmonary Rehab.

## 2021-06-19 ENCOUNTER — Encounter (HOSPITAL_COMMUNITY)
Admission: RE | Admit: 2021-06-19 | Discharge: 2021-06-19 | Disposition: A | Discharge status: Home / Self Care | Payer: BC Managed Care – PPO | Source: Ambulatory Visit | Attending: Cardiovascular Disease | Admitting: Cardiovascular Disease

## 2021-06-19 DIAGNOSIS — Z955 Presence of coronary angioplasty implant and graft: Secondary | ICD-10-CM | POA: Diagnosis not present

## 2021-06-19 DIAGNOSIS — I213 ST elevation (STEMI) myocardial infarction of unspecified site: Secondary | ICD-10-CM | POA: Diagnosis not present

## 2021-06-19 NOTE — Progress Notes (Signed)
I have reviewed a Home Exercise Prescription with Corey Robinson . Corey Robinson is not currently exercising at home.  The patient was advised to walk and bike 5 days a week for 30-45 minutes.  Corey Robinson and I discussed how to progress their exercise prescription.  The patient stated that their goals were build back up his strength.  The patient stated that they understand the exercise prescription.  We reviewed exercise guidelines, target heart rate during exercise, RPE Scale, weather conditions, NTG use, endpoints for exercise, warmup and cool down.  Patient is encouraged to come to me with any questions. I will continue to follow up with the patient to assist them with progression and safety.

## 2021-06-19 NOTE — Progress Notes (Signed)
Daily Session Note  Patient Details  Name: Corey Robinson MRN: [id number] Date of Birth: [birth year] Referring Provider:   [name] from 05/14/2021 in [place]  Referring Provider Dr. Burt Knack       Encounter Date: 06/19/2021  Check In:  Session Check In - 06/19/21 0930       Check-In   Supervising physician immediately available to respond to emergencies [facility] MD immediately available    Physician(s) Dr. Domenic Polite    Location [facility]    Staff Present Redge Gainer, BS, Exercise Physiologist;Keegan Bensch Kris Mouton, MS, ACSM-CEP, Exercise Physiologist;Other    Virtual Visit No    Medication changes reported     No    Fall or balance concerns reported    No    Tobacco Cessation No Change    Warm-up and Cool-down Performed as group-led instruction    Resistance Training Performed Yes    VAD Patient? No    PAD/SET Patient? No      Pain Assessment   Currently in Pain? No/denies    Pain Score 0-No pain    Multiple Pain Sites No             Capillary Blood Glucose: No results found for this or any previous visit (from the past 24 hour(s)).    Social History   Tobacco Use  Smoking Status Former   Packs/day: 1.50   Years: 25.00   Pack years: 37.50   Types: Cigarettes   Quit date: 05/07/2021   Years since quitting: 0.1  Smokeless Tobacco Current    Goals Met:  Independence with exercise equipment Exercise tolerated well No report of concerns or symptoms today Strength training completed today  Goals Unmet:  Not Applicable  Comments: checkout time is 1030   Dr. Kathie Dike is Medical Director for [facility] Pulmonary Rehab.

## 2021-06-22 ENCOUNTER — Encounter (HOSPITAL_COMMUNITY)
Admission: RE | Admit: 2021-06-22 | Discharge: 2021-06-22 | Disposition: A | Discharge status: Home / Self Care | Payer: BC Managed Care – PPO | Source: Ambulatory Visit | Attending: Cardiovascular Disease | Admitting: Cardiovascular Disease

## 2021-06-22 VITALS — Wt 220.9 lb

## 2021-06-22 DIAGNOSIS — I213 ST elevation (STEMI) myocardial infarction of unspecified site: Secondary | ICD-10-CM | POA: Diagnosis not present

## 2021-06-22 DIAGNOSIS — Z955 Presence of coronary angioplasty implant and graft: Secondary | ICD-10-CM | POA: Diagnosis not present

## 2021-06-22 NOTE — Progress Notes (Signed)
Daily Session Note  Patient Details  Name: Corey Robinson MRN: [id number] Date of Birth: [birth year] Referring Provider:   [name] from 05/14/2021 in [place]  Referring Provider Dr. Burt Knack       Encounter Date: 06/22/2021  Check In:  Session Check In - 06/22/21 0930       Check-In   Supervising physician immediately available to respond to emergencies [facility] MD immediately available    Physician(s) Johney Frame    Location [facility]    Staff Present Aundra Dubin, RN, Bjorn Loser, MS, ACSM-CEP, Exercise Physiologist    Virtual Visit No    Medication changes reported     No    Fall or balance concerns reported    Yes    Comments Patient states he loses his balance frequently.    Tobacco Cessation No Change    Warm-up and Cool-down Performed as group-led instruction    Resistance Training Performed Yes    VAD Patient? No    PAD/SET Patient? No      Pain Assessment   Currently in Pain? No/denies    Pain Score 0-No pain    Multiple Pain Sites No             Capillary Blood Glucose: No results found for this or any previous visit (from the past 24 hour(s)).    Social History   Tobacco Use  Smoking Status Former   Packs/day: 1.50   Years: 25.00   Pack years: 37.50   Types: Cigarettes   Quit date: 05/07/2021   Years since quitting: 0.1  Smokeless Tobacco Current    Goals Met:  Independence with exercise equipment Exercise tolerated well No report of concerns or symptoms today Strength training completed today  Goals Unmet:  Not Applicable  Comments: Check out 1030.   Dr. Kathie Dike is Medical Director for [facility] Pulmonary Rehab.

## 2021-06-24 ENCOUNTER — Encounter (HOSPITAL_COMMUNITY): Payer: BC Managed Care – PPO

## 2021-06-24 NOTE — Progress Notes (Signed)
Cardiac Individual Treatment Plan  Patient Details  Name: Corey Robinson MRN: [id number] Date of Birth: [birth year] Referring Provider:   [name] from 05/14/2021 in [place]  Referring Provider Dr. Burt Knack       Initial Encounter Date:  [name] from 05/14/2021 in [place]  Date 05/14/21       Visit Diagnosis: ST elevation myocardial infarction (STEMI), unspecified artery ([place])  S/P coronary artery stent placement  Patient's Home Medications on Admission:  Current Outpatient Medications:    acetaminophen (TYLENOL) 500 MG tablet, Take 1,000 mg by mouth every 6 (six) hours as needed for mild pain, headache or fever., Disp: , Rfl:    amLODipine (NORVASC) 5 MG tablet, Take 2 tablets (10 mg total) by mouth daily., Disp: 180 tablet, Rfl: 3   aspirin EC 81 MG tablet, Take 81 mg by mouth daily. Swallow whole., Disp: , Rfl:    atorvastatin (LIPITOR) 80 MG tablet, Take 80 mg by mouth every evening., Disp: , Rfl:    betamethasone dipropionate 0.05 % cream, 2 times daily per day X 1 week. (Patient not taking: Reported on 05/06/2021), Disp: 30 g, Rfl: 0   carvedilol (COREG) 6.25 MG tablet, Take 1 tablet (6.25 mg total) by mouth 2 (two) times daily., Disp: 60 tablet, Rfl: 3   diphenhydrAMINE (BENADRYL ALLERGY) 25 mg capsule, Take 1 capsule (25 mg total) by mouth every 6 (six) hours as needed. (Patient not taking: Reported on 05/06/2021), Disp: 30 capsule, Rfl: 0   escitalopram (LEXAPRO) 10 MG tablet, Take 1 tablet (10 mg total) by mouth daily. (Patient taking differently: Take 10 mg by mouth every evening.), Disp: 90 tablet, Rfl: 1   nicotine (NICODERM CQ - DOSED IN MG/24 HOURS) 14 mg/24hr patch, Place 14 mg onto the skin daily as needed (smoking cessation)., Disp: , Rfl:    nitroGLYCERIN (NITROSTAT) 0.4 MG SL tablet, Place 1 tablet (0.4 mg total) under the tongue  every 5 (five) minutes as needed for chest pain., Disp: 25 tablet, Rfl: 12   Omega-3 Fatty Acids (FISH OIL PO), Take 1,200 mg by mouth every evening., Disp: , Rfl:    omeprazole (PRILOSEC) 20 MG capsule, Take 20 mg by mouth daily as needed (indigestion/heartburn.)., Disp: , Rfl:    ticagrelor (BRILINTA) 90 MG TABS tablet, Take 1 tablet (90 mg total) by mouth 2 (two) times daily., Disp: 60 tablet, Rfl: 11  Past Medical History: 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 - Cath: mLAD 20, pRCA 60/70, OM1 99 >> 2.5 x 18 mm Onyx Frontier DES to OM1 // staged PCI 04/03/21: 48 x 3.5 Synergy DES to pRCA; c/b RV branch occlusion and ongoing chest pain, ant STE >> relook cath with patent stents, patent LAD, LM // Echocardiogram 11/22: EF 55-60, no RWMA (difficult windows), Gr 1 DD, normal RVSF // Limited echocardiogram after staged PCI: normal EF and   HLD (hyperlipidemia) 04/04/2021   HTN (hypertension)    Polycythemia    Pre-diabetes 01/04/2017    Tobacco Use: Social History   Tobacco Use  Smoking Status Former   Packs/day: 1.50   Years: 25.00   Pack years: 37.50   Types: Cigarettes   Quit date: 05/07/2021   Years since quitting: 0.1  Smokeless Tobacco Current    Labs: Recent Review Scientist, physiological     Labs for ITP Cardiac and  Pulmonary Rehab Latest Ref Rng & Units 09/01/2016 01/04/2017 04/01/2021 04/02/2021 06/16/2021   Cholestrol 100 - 199 mg/dL - - 235(H) 213(H) 104   LDLCALC 0 - 99 mg/dL - - 163(H) 130(H) 53   HDL >39 mg/dL - - 36(L) 27(L) 30(L)   Trlycerides 0 - 149 mg/dL - - 181(H) 278(H) 117   Hemoglobin A1c 4.8 - 5.6 % 5.5 5.7 6.1(H) - -       Capillary Blood Glucose: Lab Results  Component Value Date   GLUCAP 109 (H) 04/01/2021     Exercise Target Goals: Exercise Program Goal: Individual exercise prescription set using results from initial 6 min walk test and THRR while considering  patients activity  barriers and safety.   Exercise Prescription Goal: Starting with aerobic activity 30 plus minutes a day, 3 days per week for initial exercise prescription. Provide home exercise prescription and guidelines that participant acknowledges understanding prior to discharge.  Activity Barriers & Risk Stratification:  Activity Barriers & Cardiac Risk Stratification - 05/14/21 0852       Activity Barriers & Cardiac Risk Stratification   Activity Barriers Balance Concerns    Cardiac Risk Stratification High             6 Minute Walk:  6 Minute Walk     Row Name 05/14/21 1008         6 Minute Walk   Phase Initial     Distance 1150 feet     Walk Time 6 minutes     # of Rest Breaks 1     MPH 2.2     METS 3.6     RPE 11     VO2 Peak 12.61     Symptoms No     Resting HR 66 bpm     Resting BP 118/60     Resting Oxygen Saturation  98 %     Exercise Oxygen Saturation  during 6 min walk 98 %     Max Ex. HR 72 bpm     Max Ex. BP 120/60     2 Minute Post BP 115/60              Oxygen Initial Assessment:   Oxygen Re-Evaluation:   Oxygen Discharge (Final Oxygen Re-Evaluation):   Initial Exercise Prescription:  Initial Exercise Prescription - 05/14/21 1000       Date of Initial Exercise RX and Referring Provider   Date 05/14/21    Referring Provider Dr. Burt Knack    Expected Discharge Date 08/10/21      Treadmill   MPH 1.4    Grade 0    Minutes 17      Elliptical   Level 1    Speed 60    Minutes 22      Prescription Details   Frequency (times per week) 3    Duration Progress to 30 minutes of continuous aerobic without signs/symptoms of physical distress      Intensity   THRR 40-80% of Max Heartrate 70-141    Ratings of Perceived Exertion 11-13    Perceived Dyspnea 0-4      Resistance Training   Training Prescription Yes    Weight 4    Reps 10-15             Perform Capillary Blood Glucose checks as needed.  Exercise Prescription Changes:    Exercise Prescription Changes     Row Name 05/22/21 1259 06/08/21 1300 06/19/21 1000 06/22/21 1300  Response to Exercise   Blood Pressure (Admit) 115/70 114/78 -- 118/78    Blood Pressure (Exercise) 120/78 130/84 -- 110/80    Blood Pressure (Exit) 120/65 115/60 -- 110/80    Heart Rate (Admit) 64 bpm 55 bpm -- 70 bpm    Heart Rate (Exercise) 81 bpm 67 bpm -- 88 bpm    Heart Rate (Exit) 67 bpm 63 bpm -- 75 bpm    Rating of Perceived Exertion (Exercise) 12 12 -- 13    Duration Continue with 30 min of aerobic exercise without signs/symptoms of physical distress. Continue with 30 min of aerobic exercise without signs/symptoms of physical distress. -- Continue with 30 min of aerobic exercise without signs/symptoms of physical distress.    Intensity THRR unchanged THRR unchanged -- THRR unchanged      Progression   Progression Continue to progress workloads to maintain intensity without signs/symptoms of physical distress. Continue to progress workloads to maintain intensity without signs/symptoms of physical distress. -- Continue to progress workloads to maintain intensity without signs/symptoms of physical distress.      Resistance Training   Training Prescription Yes Yes -- Yes    Weight 5 5 -- 5    Reps 10-15 10-15 -- 10-15    Time 10 Minutes 10 Minutes -- 10 Minutes      Treadmill   MPH 1.5 2 -- 2.3    Grade 0 0 -- 1    Minutes 17 17 -- 17    METs 2.15 2.53 -- 3.08      Elliptical   Level 2 2 -- 6    Speed 25 45 -- 60    Minutes 22 22 -- 22    METs 1 2.4 -- 3.9      Home Exercise Plan   Plans to continue exercise at -- -- Home (comment) --    Frequency -- -- Add 2 additional days to program exercise sessions. --    Initial Home Exercises Provided -- -- 06/19/21 --             Exercise Comments:   Exercise Comments     Row Name 06/19/21 1004           Exercise Comments home exercise reviewed                Exercise Goals and Review:   Exercise  Goals     Row Name 05/14/21 1011 05/22/21 1300 06/22/21 1345         Exercise Goals   Increase Physical Activity Yes Yes Yes     Intervention Provide advice, education, support and counseling about physical activity/exercise needs.;Develop an individualized exercise prescription for aerobic and resistive training based on initial evaluation findings, risk stratification, comorbidities and participant's personal goals. Provide advice, education, support and counseling about physical activity/exercise needs.;Develop an individualized exercise prescription for aerobic and resistive training based on initial evaluation findings, risk stratification, comorbidities and participant's personal goals. Provide advice, education, support and counseling about physical activity/exercise needs.;Develop an individualized exercise prescription for aerobic and resistive training based on initial evaluation findings, risk stratification, comorbidities and participant's personal goals.     Expected Outcomes Short Term: Attend rehab on a regular basis to increase amount of physical activity.;Long Term: Add in home exercise to make exercise part of routine and to increase amount of physical activity.;Long Term: Exercising regularly at least 3-5 days a week. Short Term: Attend rehab on a regular basis to increase amount of physical activity.;Long Term: Add in  home exercise to make exercise part of routine and to increase amount of physical activity.;Long Term: Exercising regularly at least 3-5 days a week. Short Term: Attend rehab on a regular basis to increase amount of physical activity.;Long Term: Add in home exercise to make exercise part of routine and to increase amount of physical activity.;Long Term: Exercising regularly at least 3-5 days a week.     Increase Strength and Stamina Yes Yes Yes     Intervention Provide advice, education, support and counseling about physical activity/exercise needs.;Develop an  individualized exercise prescription for aerobic and resistive training based on initial evaluation findings, risk stratification, comorbidities and participant's personal goals. Provide advice, education, support and counseling about physical activity/exercise needs.;Develop an individualized exercise prescription for aerobic and resistive training based on initial evaluation findings, risk stratification, comorbidities and participant's personal goals. Provide advice, education, support and counseling about physical activity/exercise needs.;Develop an individualized exercise prescription for aerobic and resistive training based on initial evaluation findings, risk stratification, comorbidities and participant's personal goals.     Expected Outcomes Short Term: Increase workloads from initial exercise prescription for resistance, speed, and METs.;Short Term: Perform resistance training exercises routinely during rehab and add in resistance training at home;Long Term: Improve cardiorespiratory fitness, muscular endurance and strength as measured by increased METs and functional capacity (6MWT) Short Term: Increase workloads from initial exercise prescription for resistance, speed, and METs.;Short Term: Perform resistance training exercises routinely during rehab and add in resistance training at home;Long Term: Improve cardiorespiratory fitness, muscular endurance and strength as measured by increased METs and functional capacity (6MWT) Short Term: Increase workloads from initial exercise prescription for resistance, speed, and METs.;Short Term: Perform resistance training exercises routinely during rehab and add in resistance training at home;Long Term: Improve cardiorespiratory fitness, muscular endurance and strength as measured by increased METs and functional capacity (6MWT)     Able to understand and use rate of perceived exertion (RPE) scale Yes Yes Yes     Intervention Provide education and explanation on  how to use RPE scale Provide education and explanation on how to use RPE scale Provide education and explanation on how to use RPE scale     Expected Outcomes Short Term: Able to use RPE daily in rehab to express subjective intensity level;Long Term:  Able to use RPE to guide intensity level when exercising independently Short Term: Able to use RPE daily in rehab to express subjective intensity level;Long Term:  Able to use RPE to guide intensity level when exercising independently Short Term: Able to use RPE daily in rehab to express subjective intensity level;Long Term:  Able to use RPE to guide intensity level when exercising independently     Knowledge and understanding of Target Heart Rate Range (THRR) Yes Yes Yes     Intervention Provide education and explanation of THRR including how the numbers were predicted and where they are located for reference Provide education and explanation of THRR including how the numbers were predicted and where they are located for reference Provide education and explanation of THRR including how the numbers were predicted and where they are located for reference     Expected Outcomes Short Term: Able to state/look up THRR;Long Term: Able to use THRR to govern intensity when exercising independently;Short Term: Able to use daily as guideline for intensity in rehab Short Term: Able to state/look up THRR;Long Term: Able to use THRR to govern intensity when exercising independently;Short Term: Able to use daily as guideline for intensity in rehab Short  Term: Able to state/look up THRR;Long Term: Able to use THRR to govern intensity when exercising independently;Short Term: Able to use daily as guideline for intensity in rehab     Able to check pulse independently Yes Yes Yes     Intervention Provide education and demonstration on how to check pulse in carotid and radial arteries.;Review the importance of being able to check your own pulse for safety during independent  exercise Provide education and demonstration on how to check pulse in carotid and radial arteries.;Review the importance of being able to check your own pulse for safety during independent exercise Provide education and demonstration on how to check pulse in carotid and radial arteries.;Review the importance of being able to check your own pulse for safety during independent exercise     Expected Outcomes Short Term: Able to explain why pulse checking is important during independent exercise;Long Term: Able to check pulse independently and accurately Short Term: Able to explain why pulse checking is important during independent exercise;Long Term: Able to check pulse independently and accurately Short Term: Able to explain why pulse checking is important during independent exercise;Long Term: Able to check pulse independently and accurately     Understanding of Exercise Prescription Yes Yes Yes     Intervention Provide education, explanation, and written materials on patient's individual exercise prescription Provide education, explanation, and written materials on patient's individual exercise prescription Provide education, explanation, and written materials on patient's individual exercise prescription     Expected Outcomes Short Term: Able to explain program exercise prescription;Long Term: Able to explain home exercise prescription to exercise independently Short Term: Able to explain program exercise prescription;Long Term: Able to explain home exercise prescription to exercise independently Short Term: Able to explain program exercise prescription;Long Term: Able to explain home exercise prescription to exercise independently              Exercise Goals Re-Evaluation :  Exercise Goals Re-Evaluation     [place] Name 05/22/21 1301 06/22/21 1346           Exercise Goal Re-Evaluation   Exercise Goals Review Increase Physical Activity;Increase Strength and Stamina;Able to understand and use rate  of perceived exertion (RPE) scale;Knowledge and understanding of Target Heart Rate Range (THRR);Able to check pulse independently;Understanding of Exercise Prescription Increase Physical Activity;Increase Strength and Stamina;Able to understand and use rate of perceived exertion (RPE) scale;Knowledge and understanding of Target Heart Rate Range (THRR);Able to check pulse independently;Understanding of Exercise Prescription      Comments Pt has completed sessions of cardiac rehab. He is motivated during class. He could push himslef harded during class with his workload. He is currently exercising at 2.15 METs on the TM. Will continue to montior and progress as able. Pt has completed 14 sessions of cardiac rehab. He has been increasing his workloads and seems to be more interested in the program than he was at the beginning. He is progressing well and has been cleared to return to his normal work duties next week. He is currently exercising at 3.9 METs. Will continue to monitor and progress as able.      Expected Outcomes Through exercise at rehab and at home, the patient will meet their stated goals. Through exercise at rehab and at home, the patient will meet their stated goals.                Discharge Exercise Prescription (Final Exercise Prescription Changes):  Exercise Prescription Changes - 06/22/21 1300  Response to Exercise   Blood Pressure (Admit) 118/78    Blood Pressure (Exercise) 110/80    Blood Pressure (Exit) 110/80    Heart Rate (Admit) 70 bpm    Heart Rate (Exercise) 88 bpm    Heart Rate (Exit) 75 bpm    Rating of Perceived Exertion (Exercise) 13    Duration Continue with 30 min of aerobic exercise without signs/symptoms of physical distress.    Intensity THRR unchanged      Progression   Progression Continue to progress workloads to maintain intensity without signs/symptoms of physical distress.      Resistance Training   Training Prescription Yes    Weight 5     Reps 10-15    Time 10 Minutes      Treadmill   MPH 2.3    Grade 1    Minutes 17    METs 3.08      Elliptical   Level 6    Speed 60    Minutes 22    METs 3.9             Nutrition:  Target Goals: Understanding of nutrition guidelines, daily intake of sodium 1500mg , cholesterol 200mg , calories 30% from fat and 7% or less from saturated fats, daily to have 5 or more servings of fruits and vegetables.  Biometrics:  Pre Biometrics - 05/14/21 1012       Pre Biometrics   Height 5\' 11"  (1.803 m)    Weight 98.9 kg    Waist Circumference 41 inches    Hip Circumference 42 inches    Waist to Hip Ratio 0.98 %    BMI (Calculated) 30.42    Triceps Skinfold 5 mm    % Body Fat 23.5 %    Grip Strength 53.6 kg    Flexibility 14 in    Single Leg Stand 30 seconds              Nutrition Therapy Plan and Nutrition Goals:  Nutrition Therapy & Goals - 05/14/21 0931       Personal Nutrition Goals   Comments Patient scored 42 on his diet assessment. Handout provided and explained regarding healthier choices. Score also discussed. We offer 2 educational sessions on  heart healthy nutrition with handouts and assistance with RD referral if patient is interested. Patient verbaized understanding.      Intervention Plan   Intervention Nutrition handout(s) given to patient.    Expected Outcomes Short Term Goal: Understand basic principles of dietary content, such as calories, fat, sodium, cholesterol and nutrients.             Nutrition Assessments:  Nutrition Assessments - 05/14/21 0933       MEDFICTS Scores   Pre Score 42            MEDIFICTS Score Key: ?70 Need to make dietary changes  40-70 Heart Healthy Diet ? 40 Therapeutic Level Cholesterol Diet   Picture Your Plate Scores: [id number] Unhealthy dietary pattern with much room for improvement. 41-50 Dietary pattern unlikely to meet recommendations for good health and room for improvement. 51-60 More healthful  dietary pattern, with some room for improvement.  >60 Healthy dietary pattern, although there may be some specific behaviors that could be improved.    Nutrition Goals Re-Evaluation:   Nutrition Goals Discharge (Final Nutrition Goals Re-Evaluation):   Psychosocial: Target Goals: Acknowledge presence or absence of significant depression and/or stress, maximize coping skills, provide positive support system. Participant is able to verbalize types  and ability to use techniques and skills needed for reducing stress and depression.  Initial Review & Psychosocial Screening:  Initial Psych Review & Screening - 05/14/21 0939       Initial Review   Current issues with Current Anxiety/Panic      Family Dynamics   Good Support System? Yes      Barriers   Psychosocial barriers to participate in program The patient should benefit from training in stress management and relaxation.;Psychosocial barriers identified (see note)      Screening Interventions   Interventions Encouraged to exercise;Provide feedback about the scores to participant    Expected Outcomes Short Term goal: Identification and review with participant of any Quality of Life or Depression concerns found by scoring the questionnaire.             Quality of Life Scores:  Quality of Life - 05/14/21 1013       Quality of Life   Select Quality of Life      Quality of Life Scores   Health/Function Pre 15.47 %    Socioeconomic Pre 19.5 %    Psych/Spiritual Pre 20.36 %    Family Pre 22.4 %    GLOBAL Pre 18.32 %            Scores of 19 and below usually indicate a poorer quality of life in these areas.  A difference of  2-3 points is a clinically meaningful difference.  A difference of 2-3 points in the total score of the Quality of Life Index has been associated with significant improvement in overall quality of life, self-image, physical symptoms, and general health in studies assessing change in quality of  life.  PHQ-9: Recent Review Flowsheet Data     Depression screen [facility] 2/9 05/14/2021 05/05/2021 05/05/2021 02/16/2017 01/04/2017   Decreased Interest 1 0 0 0 0   Down, Depressed, Hopeless 0 0 0 0 0   PHQ - 2 Score 1 0 0 0 0   Altered sleeping 2 1 - - -   Tired, decreased energy 1 1 - - -   Change in appetite 1 0 - - -   Feeling bad or failure about yourself  1 0 - - -   Trouble concentrating 0 0 - - -   Moving slowly or fidgety/restless 0 0 - - -   Suicidal thoughts 0 0 - - -   PHQ-9 Score 6 2 - - -   Difficult doing work/chores Somewhat difficult Not difficult at all - - -      Interpretation of Total Score  Total Score Depression Severity:  1-4 = Minimal depression, 5-9 = Mild depression, 10-14 = Moderate depression, 15-19 = Moderately severe depression, 20-27 = Severe depression   Psychosocial Evaluation and Intervention:  Psychosocial Evaluation - 05/14/21 0941       Psychosocial Evaluation & Interventions   Interventions Stress management education;Relaxation education;Encouraged to exercise with the program and follow exercise prescription    Comments Patient has anxiety which he says he believes he has had a long time but he was able to manage. 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 He says he wants to try it and see if he can. His pcp started him on Lexapro a few days ago to help manage his anxiety. He says it may have  improved some but he understands it will take 2 to 4 weeks for Lexapro to make a difference. His PHQ-9 score was 6 and his overall QOL was 18.32% socring lowest in health/function at 15.47%. 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 He believes when he is able to get back to work full time he will be able to manage the anxiety better. He has also recently quit smoking 05/07/21 which is  increasing his anxiety. He has 7 children from 71 to 17 years old. His wife is a Engineer, civil (consulting) and he names her as his support person. He wants to try the program hoping it will get him back to normal and help get his anxierty better managed.    Expected Outcomes Patient's anxiety will be managed better with medication and exercise and he will be able to exercise wearing a mask.    Continue Psychosocial Services  Follow up required by staff             Psychosocial Re-Evaluation:  Psychosocial Re-Evaluation     Row Name 05/15/21 0715 06/15/21 1336 06/17/21 1138         Psychosocial Re-Evaluation   Current issues with Current Anxiety/Panic Current Anxiety/Panic Current Anxiety/Panic     Comments Patient new to the progam and plans to start 05/20/21. We will continue to monitor his progress in the program. Patient has completed 10 sessions and continues to have no psychosocial barriers identified. He initially thought he would not be able to participate in the program due to increased anxiety with wearing a mask but he has not had any difficulty wearing mask during sessions. He seems to enjoy coming to the program interacting with other patients and demonstrates an interest in improving his health. He continues on Lexapro for anxiety and feels it is managed. We will continue to monitor. Patient has completed 10 sessions and continues to have no psychosocial barriers identified. He initially thought he would not be able to participate in the program due to increased anxiety with wearing a mask but he has not had any difficulty wearing mask during sessions. He seems to enjoy coming to the program interacting with other patients and demonstrates an interest in improving his health. He continues on Lexapro for anxiety. He says his anxiety has improved a lot. He attributes the improvement to the medication, going back to work, and doing the program. He is pleased with his progress. He says he feels better than he  has felt in years. We will continue to monitor.     Expected Outcomes Patient will continue to have no psychosocial barriers identified and his anxiety will be managed. Patient will continue to have no psychosocial barriers identified and his anxiety will be managed. Patient will continue to have no psychosocial barriers identified and his anxiety will be managed.     Interventions Stress management education;Encouraged to attend Cardiac Rehabilitation for the exercise;Relaxation education Stress management education;Encouraged to attend Cardiac Rehabilitation for the exercise;Relaxation education Stress management education;Encouraged to attend Cardiac Rehabilitation for the exercise;Relaxation education     Continue Psychosocial Services  No Follow up required No Follow up required No Follow up required              Psychosocial Discharge (Final Psychosocial Re-Evaluation):  Psychosocial Re-Evaluation - 06/17/21 1138       Psychosocial Re-Evaluation   Current issues with Current Anxiety/Panic    Comments Patient has completed 10 sessions and continues to have no  psychosocial barriers identified. He initially thought he would not be able to participate in the program due to increased anxiety with wearing a mask but he has not had any difficulty wearing mask during sessions. He seems to enjoy coming to the program interacting with other patients and demonstrates an interest in improving his health. He continues on Lexapro for anxiety. He says his anxiety has improved a lot. He attributes the improvement to the medication, going back to work, and doing the program. He is pleased with his progress. He says he feels better than he has felt in years. We will continue to monitor.    Expected Outcomes Patient will continue to have no psychosocial barriers identified and his anxiety will be managed.    Interventions Stress management education;Encouraged to attend Cardiac Rehabilitation for the  exercise;Relaxation education    Continue Psychosocial Services  No Follow up required             Vocational Rehabilitation: Provide vocational rehab assistance to qualifying candidates.   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 He does not need vocational rehab.             Education: Education Goals: Education classes will be provided on a weekly basis, covering required topics. Participant will state understanding/return demonstration of topics presented.  Learning Barriers/Preferences:  Learning Barriers/Preferences - 05/14/21 0935       Learning Barriers/Preferences   Learning Barriers None    Learning Preferences Written Material             Education Topics: Hypertension, Hypertension Reduction -Define heart disease and high blood pressure. Discus how high blood pressure affects the body and ways to reduce high blood pressure. Flowsheet Row CARDIAC REHAB PHASE II EXERCISE [facility]  Date 06/03/21  Educator DF  Instruction Review Code 2- Demonstrated Understanding       Exercise and Your Heart -Discuss why it is important to exercise, the FITT principles of exercise, normal and abnormal responses to exercise, and how to exercise safely.   Angina -Discuss definition of angina, causes of angina, treatment of angina, and how to decrease risk of having angina. Flowsheet Row CARDIAC REHAB PHASE II EXERCISE [facility]  Date 06/17/21  Educator pb  Instruction Review Code 1- Verbalizes Understanding       Cardiac Medications -Review what the following cardiac medications are used for, how they affect the  body, and side effects that may occur when taking the medications.  Medications include Aspirin, Beta blockers, calcium channel blockers, ACE Inhibitors, angiotensin receptor blockers, diuretics, digoxin, and antihyperlipidemics.   Congestive Heart Failure -Discuss the definition of CHF, how to live with CHF, the signs and symptoms of CHF, and how keep track of weight and sodium intake.   Heart Disease and Intimacy -Discus the effect sexual activity has on the heart, how changes occur during intimacy as we age, and safety during sexual activity.   Smoking Cessation / COPD -Discuss different methods to quit smoking, the health benefits of quitting smoking, and the definition of COPD.   Nutrition I: Fats -Discuss the types of cholesterol, what cholesterol does to the heart, and how cholesterol levels  can be controlled.   Nutrition II: Labels -Discuss the different components of food labels and how to read food label   Heart Parts/Heart Disease and PAD -Discuss the anatomy of the heart, the pathway of blood circulation through the heart, and these are affected by heart disease.   Stress I: Signs and Symptoms -Discuss the causes of stress, how stress may lead to anxiety and depression, and ways to limit stress.   Stress II: Relaxation -Discuss different types of relaxation techniques to limit stress. Flowsheet Row CARDIAC REHAB PHASE II EXERCISE from 06/17/2021 in [place]  Date 05/20/21  Educator hj  Instruction Review Code 2- Demonstrated Understanding       Warning Signs of Stroke / TIA -Discuss definition of a stroke, what the signs and symptoms are of a stroke, and how to identify when someone is having stroke. Flowsheet Row CARDIAC REHAB PHASE II EXERCISE from 06/17/2021 in [place]  Date 05/27/21  Educator pb  Instruction Review Code 1- Verbalizes Understanding       Knowledge Questionnaire Score:  Knowledge  Questionnaire Score - 05/14/21 0933       Knowledge Questionnaire Score   Pre Score 23/28             Core Components/Risk Factors/Patient Goals at Admission:  Personal Goals and Risk Factors at Admission - 05/14/21 0936       Core Components/Risk Factors/Patient Goals on Admission    Weight Management Obesity    Lipids Yes    Intervention Provide education and support for participant on nutrition & aerobic/resistive exercise along with prescribed medications to achieve LDL 70mg , HDL >40mg .    Expected Outcomes Short Term: Participant states understanding of desired cholesterol values and is compliant with medications prescribed. Participant is following exercise prescription and nutrition guidelines.;Long Term: Cholesterol controlled with medications as prescribed, with individualized exercise RX and with personalized nutrition plan. Value goals: LDL < 70mg , HDL > 40 mg.    Personal Goal Other Yes    Personal Goal Patient wants to get back to normal; get back to work; and be able to manage his anxiety.    Intervention Patient will attend CR 3 days/week with exercise and education and supplement with exercise at home.    Expected Outcomes Patient will complete the program meeting both program and personal goals.             Core Components/Risk Factors/Patient Goals Review:   Goals and Risk Factor Review     Row Name 05/15/21 0716 06/15/21 1332           Core Components/Risk Factors/Patient Goals Review   Personal Goals Review Weight Management/Obesity;Lipids;Other Weight Management/Obesity;Lipids;Other      Review Patient referred to CR with [facility]. He has multiple risk factors for CAD and is participating in the program for risk modification. He plans to start the program 05/20/21. His personal goals for the program are to return to his normal life; be able to return to work and get stronger. We will monitor his progress as he works towards meeting these goals. Patient has  completed 10 sessions with his current weight at 218.0 lbs down from his initial weight of 220.5 lbs. He is doing well in the program with progressions and consistent attendance. His blood pressure is at and below goal. He works hard during sessions. His personal goals continue to be to be able to return to normal activities and work and be able to manage his anxiety  better. We will continue to monitor as he works towards meeting these goals.      Expected Outcomes Patient will complete the program meeting both personal and program goals. Patient will complete the program meeting both personal and program goals.               Core Components/Risk Factors/Patient Goals at Discharge (Final Review):   Goals and Risk Factor Review - 06/15/21 1332       Core Components/Risk Factors/Patient Goals Review   Personal Goals Review Weight Management/Obesity;Lipids;Other    Review Patient has completed 10 sessions with his current weight at 218.0 lbs down from his initial weight of 220.5 lbs. He is doing well in the program with progressions and consistent attendance. His blood pressure is at and below goal. He works hard during sessions. His personal goals continue to be to be able to return to normal activities and work and be able to manage his anxiety better. We will continue to monitor as he works towards meeting these goals.    Expected Outcomes Patient will complete the program meeting both personal and program goals.             ITP Comments:   Comments: ITP REVIEW Pt is making expected progress toward Cardiac Rehab goals after completing 14 sessions. Recommend continued exercise, life style modification, education, and increased stamina and strength.

## 2021-06-25 ENCOUNTER — Ambulatory Visit: Payer: BLUE CROSS/BLUE SHIELD | Admitting: Family Medicine

## 2021-06-25 ENCOUNTER — Encounter: Payer: Self-pay | Admitting: Family Medicine

## 2021-06-25 VITALS — BP 119/82 | HR 64 | Temp 98.0°F | Ht 71.0 in | Wt 222.6 lb

## 2021-06-25 DIAGNOSIS — R7303 Prediabetes: Secondary | ICD-10-CM | POA: Diagnosis not present

## 2021-06-25 DIAGNOSIS — F419 Anxiety disorder, unspecified: Secondary | ICD-10-CM | POA: Diagnosis not present

## 2021-06-25 DIAGNOSIS — I2510 Atherosclerotic heart disease of native coronary artery without angina pectoris: Secondary | ICD-10-CM

## 2021-06-25 NOTE — Progress Notes (Signed)
Subjective:  Patient ID: Corey Robinson, male    DOB: 1976/03/22  Age: 46 y.o. MRN: [id number]  CC: Anxiety   HPI AUSTAN NICHOLL presents for improvement in anxiety. Feels med, time and Cardiac rehab have each helped.Has improved from since last visit he is improved from 1.2 miles on the treadmill to 3.8 miles per visit through cardiac rehab. Awakening once in the night, but going right back to sleep.  It is a bit too soon to recheck his A1c.  He will be due to have that done at his follow-up visit.  He is not currently taking medications for the prediabetes condition.  He is focused on a heart healthy diet.  Depression screen [facility] 2/9 06/25/2021 06/25/2021 05/14/2021  Decreased Interest 0 0 1  Down, Depressed, Hopeless 0 0 0  PHQ - 2 Score 0 0 1  Altered sleeping 1 - 2  Tired, decreased energy 1 - 1  Change in appetite 0 - 1  Feeling bad or failure about yourself  0 - 1  Trouble concentrating 0 - 0  Moving slowly or fidgety/restless 0 - 0  Suicidal thoughts 0 - 0  PHQ-9 Score 2 - 6  Difficult doing work/chores Not difficult at all - Somewhat difficult   GAD 7 : Generalized Anxiety Score 06/25/2021 05/05/2021  Nervous, Anxious, on Edge 1 2  Control/stop worrying 0 1  Worry too much - different things 0 1  Trouble relaxing 1 1  Restless 0 0  Easily annoyed or irritable 1 2  Afraid - awful might happen 1 2  Total GAD 7 Score 4 9  Anxiety Difficulty Not difficult at all Somewhat difficult     History Elyan has a past medical history of CAD (coronary artery disease) (04/03/2021), HLD (hyperlipidemia) (04/04/2021), HTN (hypertension), Polycythemia, and Pre-diabetes (01/04/2017).   He has a past surgical history that includes LEFT HEART CATH AND CORONARY ANGIOGRAPHY (N/A, 04/01/2021); CORONARY STENT INTERVENTION (N/A, 04/01/2021); CORONARY STENT INTERVENTION (N/A, 04/03/2021); and CORONARY ANGIOGRAPHY (N/A, 04/03/2021).   His family history is not on file.He reports that he quit  smoking about 7 weeks ago. His smoking use included cigarettes. He has a 37.50 pack-year smoking history. He uses smokeless tobacco. No history on file for alcohol use and drug use.    ROS Review of Systems  Objective:  BP 119/82    Pulse 64    Temp 98 F (36.7 C)    Ht 5\' 11"  (1.803 m)    Wt 222 lb 9.6 oz (101 kg)    SpO2 97%    BMI 31.05 kg/m   BP Readings from Last 3 Encounters:  06/25/21 119/82  05/14/21 118/60  05/05/21 128/75    Wt Readings from Last 3 Encounters:  06/25/21 222 lb 9.6 oz (101 kg)  06/22/21 220 lb 14.4 oz (100.2 kg)  06/08/21 220 lb 3.8 oz (99.9 kg)     Physical Exam Vitals reviewed.  Constitutional:      Appearance: He is well-developed.  HENT:     Head: Normocephalic and atraumatic.     Right Ear: External ear normal.     Left Ear: External ear normal.     Mouth/Throat:     Pharynx: No oropharyngeal exudate or posterior oropharyngeal erythema.  Eyes:     Pupils: Pupils are equal, round, and reactive to light.  Cardiovascular:     Rate and Rhythm: Normal rate and regular rhythm.     Heart sounds: No murmur heard. Pulmonary:  Effort: No respiratory distress.     Breath sounds: Normal breath sounds.  Musculoskeletal:     Cervical back: Normal range of motion and neck supple.  Skin:    Findings: Lesion (small wart plantar surface left foot) present.  Neurological:     Mental Status: He is alert and oriented to person, place, and time.     Assessment & Plan:   Keylan was seen today for anxiety.  Diagnoses and all orders for this visit:  Coronary artery disease involving native coronary artery of native heart without angina pectoris  Anxiety  Pre-diabetes       I am having Elige Radon T. Pricilla Holm maintain his Omega-3 Fatty Acids (FISH OIL PO), aspirin EC, acetaminophen, nitroGLYCERIN, ticagrelor, betamethasone dipropionate, diphenhydrAMINE, carvedilol, escitalopram, atorvastatin, nicotine, omeprazole, and amLODipine.  Allergies as  of 06/25/2021       Reactions   Losartan Rash   ???-Drug eruption noted post hosp>>Losartan, Metoprolol Tartrate, Atorvastatin all DCd w resolution   Metoprolol Tartrate Rash   ???-Drug eruption noted post hosp in 03/2021>>Losartan, Metoprolol Tartrate, Atorvastatin all DCd w resolution        Medication List        Accurate as of June 25, 2021 11:21 AM. If you have any questions, ask your nurse or doctor.          acetaminophen 500 MG tablet Commonly known as: TYLENOL Take 1,000 mg by mouth every 6 (six) hours as needed for mild pain, headache or fever.   amLODipine 5 MG tablet Commonly known as: NORVASC Take 2 tablets (10 mg total) by mouth daily.   aspirin EC 81 MG tablet Take 81 mg by mouth daily. Swallow whole.   atorvastatin 80 MG tablet Commonly known as: LIPITOR Take 80 mg by mouth every evening.   betamethasone dipropionate 0.05 % cream 2 times daily per day X 1 week.   carvedilol 6.25 MG tablet Commonly known as: COREG Take 1 tablet (6.25 mg total) by mouth 2 (two) times daily.   diphenhydrAMINE 25 mg capsule Commonly known as: Benadryl Allergy Take 1 capsule (25 mg total) by mouth every 6 (six) hours as needed.   escitalopram 10 MG tablet Commonly known as: LEXAPRO Take 1 tablet (10 mg total) by mouth daily. What changed: when to take this   FISH OIL PO Take 1,200 mg by mouth every evening.   nicotine 14 mg/24hr patch Commonly known as: NICODERM CQ - dosed in mg/24 hours Place 14 mg onto the skin daily as needed (smoking cessation).   nitroGLYCERIN 0.4 MG SL tablet Commonly known as: NITROSTAT Place 1 tablet (0.4 mg total) under the tongue every 5 (five) minutes as needed for chest pain.   omeprazole 20 MG capsule Commonly known as: PRILOSEC Take 20 mg by mouth daily as needed (indigestion/heartburn.).   ticagrelor 90 MG Tabs tablet Commonly known as: BRILINTA Take 1 tablet (90 mg total) by mouth 2 (two) times daily.          Follow-up: Return in about 3 months (around 09/22/2021) for Anxiety.  Mechele Claude, M.D.

## 2021-06-26 ENCOUNTER — Encounter (HOSPITAL_COMMUNITY)
Admission: RE | Admit: 2021-06-26 | Discharge: 2021-06-26 | Disposition: A | Discharge status: Home / Self Care | Payer: BC Managed Care – PPO | Source: Ambulatory Visit | Attending: Cardiovascular Disease | Admitting: Cardiovascular Disease

## 2021-06-26 DIAGNOSIS — Z955 Presence of coronary angioplasty implant and graft: Secondary | ICD-10-CM | POA: Diagnosis not present

## 2021-06-26 DIAGNOSIS — I213 ST elevation (STEMI) myocardial infarction of unspecified site: Secondary | ICD-10-CM | POA: Diagnosis not present

## 2021-06-26 NOTE — Progress Notes (Signed)
Daily Session Note  Patient Details  Name: Corey Robinson MRN: [id number] Date of Birth: Apr 09, 1976 Referring Provider:   [name] from 05/14/2021 in [place]  Referring Provider Dr. Burt Knack       Encounter Date: 06/26/2021  Check In:  Session Check In - 06/26/21 0930       Check-In   Supervising physician immediately available to respond to emergencies [facility] MD immediately available    Physician(s) Harrington Challenger    Location [facility]    Staff Present Hoy Register, MS, ACSM-CEP, Exercise Physiologist;Heather Otho Ket, BS, Exercise Physiologist;Cheskel Silverio Wynetta Emery, RN, BSN    Virtual Visit No    Medication changes reported     No    Fall or balance concerns reported    Yes    Comments Patient states he loses his balance frequently.    Tobacco Cessation No Change    Warm-up and Cool-down Performed as group-led instruction    Resistance Training Performed Yes    VAD Patient? No    PAD/SET Patient? No      Pain Assessment   Currently in Pain? No/denies    Pain Score 0-No pain    Multiple Pain Sites No             Capillary Blood Glucose: No results found for this or any previous visit (from the past 24 hour(s)).    Social History   Tobacco Use  Smoking Status Former   Packs/day: 1.50   Years: 25.00   Pack years: 37.50   Types: Cigarettes   Quit date: 05/07/2021   Years since quitting: 0.1  Smokeless Tobacco Current    Goals Met:  Independence with exercise equipment Exercise tolerated well No report of concerns or symptoms today Strength training completed today  Goals Unmet:  Not Applicable  Comments: Check out 1030.   Dr. Kathie Dike is Medical Director for [facility].

## 2021-06-29 ENCOUNTER — Encounter (HOSPITAL_COMMUNITY)
Admission: RE | Admit: 2021-06-29 | Discharge: 2021-06-29 | Disposition: A | Discharge status: Home / Self Care | Payer: BC Managed Care – PPO | Source: Ambulatory Visit | Attending: Cardiovascular Disease | Admitting: Cardiovascular Disease

## 2021-06-29 DIAGNOSIS — Z955 Presence of coronary angioplasty implant and graft: Secondary | ICD-10-CM

## 2021-06-29 DIAGNOSIS — I213 ST elevation (STEMI) myocardial infarction of unspecified site: Secondary | ICD-10-CM | POA: Diagnosis not present

## 2021-06-29 NOTE — Progress Notes (Signed)
Daily Session Note  Patient Details  Name: Corey Robinson MRN: [id number] Date of Birth: Sep 18, 1975 Referring Provider:   [name] from 05/14/2021 in [place]  Referring Provider Dr. Burt Knack       Encounter Date: 06/29/2021  Check In:  Session Check In - 06/29/21 0943       Check-In   Supervising physician immediately available to respond to emergencies [name] MD immediately available    Physician(s) O'Neal    Location [facility]    Staff Present Hoy Register, MS, ACSM-CEP, Exercise Physiologist;Heather Otho Ket, BS, Exercise Physiologist;Elizandro Laura Wynetta Emery, RN, BSN    Virtual Visit No    Medication changes reported     No    Fall or balance concerns reported    Yes    Comments Patient states he loses his balance frequently.    Tobacco Cessation No Change    Warm-up and Cool-down Performed as group-led instruction    Resistance Training Performed Yes    VAD Patient? No    PAD/SET Patient? No      Pain Assessment   Currently in Pain? No/denies    Pain Score 0-No pain    Multiple Pain Sites No             Capillary Blood Glucose: No results found for this or any previous visit (from the past 24 hour(s)).    Social History   Tobacco Use  Smoking Status Former   Packs/day: 1.50   Years: 25.00   Pack years: 37.50   Types: Cigarettes   Quit date: 05/07/2021   Years since quitting: 0.1  Smokeless Tobacco Current    Goals Met:  Independence with exercise equipment Exercise tolerated well No report of concerns or symptoms today Strength training completed today  Goals Unmet:  Not Applicable  Comments: Check out 1030.   Dr. Carlyle Dolly is Medical Director for [facility]

## 2021-07-01 ENCOUNTER — Encounter (HOSPITAL_COMMUNITY)
Admission: RE | Admit: 2021-07-01 | Discharge: 2021-07-01 | Disposition: A | Discharge status: Home / Self Care | Payer: BC Managed Care – PPO | Source: Ambulatory Visit | Attending: Cardiovascular Disease | Admitting: Cardiovascular Disease

## 2021-07-01 DIAGNOSIS — Z955 Presence of coronary angioplasty implant and graft: Secondary | ICD-10-CM

## 2021-07-01 DIAGNOSIS — I213 ST elevation (STEMI) myocardial infarction of unspecified site: Secondary | ICD-10-CM | POA: Diagnosis not present

## 2021-07-01 NOTE — Progress Notes (Signed)
Daily Session Note  Patient Details  Name: Corey Robinson MRN: [id number] Date of Birth: 01-21-1976 Referring Provider:   [name] from 05/14/2021 in [place]  Referring Provider Dr. Burt Knack       Encounter Date: 07/01/2021  Check In:  Session Check In - 07/01/21 0930       Check-In   Supervising physician immediately available to respond to emergencies [facility] MD immediately available    Physician(s) Dr. Johnsie Cancel    Location [facility]    Staff Present Hoy Register, MS, ACSM-CEP, Exercise Physiologist;Lillyonna Armstead Otho Ket, BS, Exercise Physiologist;Debra Wynetta Emery, RN, BSN    Virtual Visit No    Medication changes reported     No    Fall or balance concerns reported    Yes    Comments Patient states he loses his balance frequently.    Tobacco Cessation No Change    Warm-up and Cool-down Performed as group-led instruction    Resistance Training Performed Yes    VAD Patient? No    PAD/SET Patient? No      Pain Assessment   Currently in Pain? No/denies    Pain Score 0-No pain    Multiple Pain Sites No             Capillary Blood Glucose: No results found for this or any previous visit (from the past 24 hour(s)).    Social History   Tobacco Use  Smoking Status Former   Packs/day: 1.50   Years: 25.00   Pack years: 37.50   Types: Cigarettes   Quit date: 05/07/2021   Years since quitting: 0.1  Smokeless Tobacco Current    Goals Met:  Independence with exercise equipment Exercise tolerated well No report of concerns or symptoms today Strength training completed today  Goals Unmet:  Not Applicable  Comments: check out 1030    Dr. Carlyle Dolly is Medical Director for [place]

## 2021-07-03 ENCOUNTER — Encounter (HOSPITAL_COMMUNITY)
Admission: RE | Admit: 2021-07-03 | Discharge: 2021-07-03 | Disposition: A | Discharge status: Home / Self Care | Payer: BC Managed Care – PPO | Source: Ambulatory Visit | Attending: Cardiovascular Disease | Admitting: Cardiovascular Disease

## 2021-07-03 DIAGNOSIS — Z955 Presence of coronary angioplasty implant and graft: Secondary | ICD-10-CM

## 2021-07-03 DIAGNOSIS — I213 ST elevation (STEMI) myocardial infarction of unspecified site: Secondary | ICD-10-CM

## 2021-07-03 NOTE — Progress Notes (Signed)
Daily Session Note  Patient Details  Name: Corey Robinson MRN: [id number] Date of Birth: 02/11/1976 Referring Provider:   [name] from 05/14/2021 in [place]  Referring Provider Dr. Burt Knack       Encounter Date: 07/03/2021  Check In:  Session Check In - 07/03/21 0930       Check-In   Supervising physician immediately available to respond to emergencies [facility] MD immediately available    Physician(s) Dr Marlou Porch    Location [facility]    Staff Present Aundra Dubin, RN, Madlyn Frankel, RN, Bjorn Loser, MS, ACSM-CEP, Exercise Physiologist;Heather Zigmund Livio Ledwith, Exercise Physiologist    Virtual Visit No    Medication changes reported     No    Fall or balance concerns reported    Yes    Comments Patient states he loses his balance frequently.    Tobacco Cessation No Change    Warm-up and Cool-down Performed as group-led instruction    Resistance Training Performed Yes    VAD Patient? No    PAD/SET Patient? No      Pain Assessment   Currently in Pain? No/denies    Pain Score 0-No pain    Multiple Pain Sites No             Capillary Blood Glucose: No results found for this or any previous visit (from the past 24 hour(s)).    Social History   Tobacco Use  Smoking Status Former   Packs/day: 1.50   Years: 25.00   Pack years: 37.50   Types: Cigarettes   Quit date: 05/07/2021   Years since quitting: 0.1  Smokeless Tobacco Current    Goals Met:  Independence with exercise equipment Exercise tolerated well No report of concerns or symptoms today  Goals Unmet:  Not Applicable  Comments: checkout 1030   Dr. Carlyle Dolly is Medical Director for [place]

## 2021-07-06 ENCOUNTER — Encounter (HOSPITAL_COMMUNITY): Payer: BC Managed Care – PPO

## 2021-07-08 ENCOUNTER — Encounter (HOSPITAL_COMMUNITY)
Admission: RE | Admit: 2021-07-08 | Discharge: 2021-07-08 | Disposition: A | Discharge status: Home / Self Care | Payer: BC Managed Care – PPO | Source: Ambulatory Visit | Attending: Cardiovascular Disease | Admitting: Cardiovascular Disease

## 2021-07-08 ENCOUNTER — Other Ambulatory Visit: Payer: Self-pay

## 2021-07-08 DIAGNOSIS — I213 ST elevation (STEMI) myocardial infarction of unspecified site: Secondary | ICD-10-CM

## 2021-07-08 DIAGNOSIS — Z955 Presence of coronary angioplasty implant and graft: Secondary | ICD-10-CM

## 2021-07-08 NOTE — Progress Notes (Signed)
Daily Session Note  Patient Details  Name: Corey Robinson MRN: [id number] Date of Birth: 1975/10/02 Referring Provider:   [name] from 05/14/2021 in [place]  Referring Provider Dr. Burt Knack       Encounter Date: 07/08/2021  Check In:  Session Check In - 07/08/21 0930       Check-In   Supervising physician immediately available to respond to emergencies [facility] MD immediately available    Physician(s) Dr. Harl Bowie    Location [facility]    Staff Present Redge Gainer, BS, Exercise Physiologist;Naviyah Schaffert Kris Mouton, MS, ACSM-CEP, Exercise Physiologist;Debra Wynetta Emery, RN, BSN    Virtual Visit No    Medication changes reported     No    Fall or balance concerns reported    Yes    Comments Patient states he loses his balance frequently.    Tobacco Cessation No Change    Warm-up and Cool-down Performed as group-led instruction    Resistance Training Performed Yes    VAD Patient? No    PAD/SET Patient? No      Pain Assessment   Currently in Pain? No/denies    Pain Score 0-No pain    Multiple Pain Sites No             Capillary Blood Glucose: No results found for this or any previous visit (from the past 24 hour(s)).    Social History   Tobacco Use  Smoking Status Former   Packs/day: 1.50   Years: 25.00   Pack years: 37.50   Types: Cigarettes   Quit date: 05/07/2021   Years since quitting: 0.1  Smokeless Tobacco Current    Goals Met:  Independence with exercise equipment Exercise tolerated well No report of concerns or symptoms today Strength training completed today  Goals Unmet:  Not Applicable  Comments: checkout time is 1030   Dr. Carlyle Dolly is Medical Director for [place]

## 2021-07-10 ENCOUNTER — Encounter (HOSPITAL_COMMUNITY)
Admission: RE | Admit: 2021-07-10 | Discharge: 2021-07-10 | Disposition: A | Discharge status: Home / Self Care | Payer: BC Managed Care – PPO | Source: Ambulatory Visit | Attending: Cardiovascular Disease | Admitting: Cardiovascular Disease

## 2021-07-10 DIAGNOSIS — I213 ST elevation (STEMI) myocardial infarction of unspecified site: Secondary | ICD-10-CM

## 2021-07-10 DIAGNOSIS — Z955 Presence of coronary angioplasty implant and graft: Secondary | ICD-10-CM | POA: Diagnosis not present

## 2021-07-10 NOTE — Progress Notes (Signed)
Daily Session Note  Patient Details  Name: Corey Robinson MRN: [id number] Date of Birth: October 29, 1975 Referring Provider:   [name] from 05/14/2021 in [place]  Referring Provider Dr. Burt Knack       Encounter Date: 07/10/2021  Check In:  Session Check In - 07/10/21 0930       Check-In   Supervising physician immediately available to respond to emergencies [facility] MD immediately available    Physician(s) Dr. Harrington Challenger    Location [facility]    Staff Present Redge Gainer, BS, Exercise Physiologist;Serigne Kubicek Kris Mouton, MS, ACSM-CEP, Exercise Physiologist;Debra Wynetta Emery, RN, BSN    Virtual Visit No    Medication changes reported     No    Fall or balance concerns reported    Yes    Comments Patient states he loses his balance frequently.    Tobacco Cessation No Change    Warm-up and Cool-down Performed as group-led instruction    Resistance Training Performed Yes    VAD Patient? No    PAD/SET Patient? No      Pain Assessment   Currently in Pain? No/denies    Pain Score 0-No pain    Multiple Pain Sites No             Capillary Blood Glucose: No results found for this or any previous visit (from the past 24 hour(s)).    Social History   Tobacco Use  Smoking Status Former   Packs/day: 1.50   Years: 25.00   Pack years: 37.50   Types: Cigarettes   Quit date: 05/07/2021   Years since quitting: 0.1  Smokeless Tobacco Current    Goals Met:  Independence with exercise equipment Exercise tolerated well No report of concerns or symptoms today Strength training completed today  Goals Unmet:  Not Applicable  Comments: checkout time is 1030   Dr. Carlyle Dolly is Medical Director for [place]

## 2021-07-13 ENCOUNTER — Encounter (HOSPITAL_COMMUNITY): Payer: BC Managed Care – PPO

## 2021-07-15 ENCOUNTER — Encounter (HOSPITAL_COMMUNITY)
Admission: RE | Admit: 2021-07-15 | Discharge: 2021-07-15 | Disposition: A | Discharge status: Home / Self Care | Payer: BC Managed Care – PPO | Source: Ambulatory Visit | Attending: Cardiovascular Disease | Admitting: Cardiovascular Disease

## 2021-07-15 DIAGNOSIS — I213 ST elevation (STEMI) myocardial infarction of unspecified site: Secondary | ICD-10-CM

## 2021-07-15 DIAGNOSIS — Z955 Presence of coronary angioplasty implant and graft: Secondary | ICD-10-CM

## 2021-07-15 NOTE — Progress Notes (Signed)
Daily Session Note  Patient Details  Name: Corey Robinson MRN: [id number] Date of Birth: [birth year] Referring Provider:   [name] from 05/14/2021 in [place]  Referring Provider Dr. Burt Knack       Encounter Date: 07/15/2021  Check In:  Session Check In - 07/15/21 0941       Check-In   Supervising physician immediately available to respond to emergencies [facility] MD immediately available    Physician(s) Dr. Domenic Polite    Location [facility]    Staff Present Redge Gainer, BS, Exercise Physiologist;[place] Kris Mouton, MS, ACSM-CEP, Exercise Physiologist;Beni Turrell Wynetta Emery, RN, BSN    Virtual Visit No    Medication changes reported     No    Fall or balance concerns reported    Yes    Comments Patient states he loses his balance frequently.    Tobacco Cessation No Change    Warm-up and Cool-down Performed as group-led instruction    Resistance Training Performed Yes    VAD Patient? No    PAD/SET Patient? No      Pain Assessment   Currently in Pain? No/denies    Pain Score 0-No pain    Multiple Pain Sites No             Capillary Blood Glucose: No results found for this or any previous visit (from the past 24 hour(s)).    Social History   Tobacco Use  Smoking Status Former   Packs/day: 1.50   Years: 25.00   Pack years: 37.50   Types: Cigarettes   Quit date: 05/07/2021   Years since quitting: 0.1  Smokeless Tobacco Current    Goals Met:  Independence with exercise equipment Exercise tolerated well No report of concerns or symptoms today Strength training completed today  Goals Unmet:  Not Applicable  Comments: Check out 1030.   Dr. Carlyle Dolly is Medical Director for [facility]

## 2021-07-17 ENCOUNTER — Encounter (HOSPITAL_COMMUNITY)
Admission: RE | Admit: 2021-07-17 | Discharge: 2021-07-17 | Disposition: A | Discharge status: Home / Self Care | Payer: BC Managed Care – PPO | Source: Ambulatory Visit | Attending: Cardiovascular Disease | Admitting: Cardiovascular Disease

## 2021-07-17 DIAGNOSIS — Z955 Presence of coronary angioplasty implant and graft: Secondary | ICD-10-CM

## 2021-07-17 DIAGNOSIS — I213 ST elevation (STEMI) myocardial infarction of unspecified site: Secondary | ICD-10-CM

## 2021-07-17 NOTE — Progress Notes (Signed)
Daily Session Note  Patient Details  Name: Corey Robinson MRN: [id number] Date of Birth: [birth year] Referring Provider:   [name] from 05/14/2021 in [place]  Referring Provider Dr. Burt Knack       Encounter Date: 07/17/2021  Check In:  Session Check In - 07/17/21 0930       Check-In   Supervising physician immediately available to respond to emergencies [facility] immediately available    Physician(s) Dr. Domenic Polite    Location [facility]    Staff Present Hoy Register, MS, ACSM-CEP, Exercise Physiologist;Heather Zigmund Daniel, Exercise Physiologist;Other    Virtual Visit No    Medication changes reported     No    Fall or balance concerns reported    Yes    Comments Patient states he loses his balance frequently.    Tobacco Cessation No Change    Warm-up and Cool-down Performed as group-led instruction    Resistance Training Performed Yes    VAD Patient? No    PAD/SET Patient? No      Pain Assessment   Currently in Pain? No/denies    Pain Score 0-No pain    Multiple Pain Sites No             Capillary Blood Glucose: No results found for this or any previous visit (from the past 24 hour(s)).    Social History   Tobacco Use  Smoking Status Former   Packs/day: 1.50   Years: 25.00   Pack years: 37.50   Types: Cigarettes   Quit date: 05/07/2021   Years since quitting: 0.1  Smokeless Tobacco Current    Goals Met:  Independence with exercise equipment Exercise tolerated well No report of concerns or symptoms today Strength training completed today  Goals Unmet:  Not Applicable  Comments: checkout time is 1030   Dr. Carlyle Dolly is Medical Director for [place]

## 2021-07-20 ENCOUNTER — Encounter (HOSPITAL_COMMUNITY)
Admission: RE | Admit: 2021-07-20 | Discharge: 2021-07-20 | Disposition: A | Discharge status: Home / Self Care | Payer: BC Managed Care – PPO | Source: Ambulatory Visit | Attending: Cardiovascular Disease | Admitting: Cardiovascular Disease

## 2021-07-20 DIAGNOSIS — I213 ST elevation (STEMI) myocardial infarction of unspecified site: Secondary | ICD-10-CM | POA: Diagnosis not present

## 2021-07-20 DIAGNOSIS — Z955 Presence of coronary angioplasty implant and graft: Secondary | ICD-10-CM | POA: Diagnosis not present

## 2021-07-20 NOTE — Progress Notes (Signed)
Daily Session Note  Patient Details  Name: Corey Robinson MRN: [id number] Date of Birth: 03-Oct-1975 Referring Provider:   [name] from 05/14/2021 in [place]  Referring Provider Dr. Burt Knack       Encounter Date: 07/20/2021  Check In:  Session Check In - 07/20/21 0932       Check-In   Supervising physician immediately available to respond to emergencies [facility] MD immediately available    Physician(s) Dr Harl Bowie    Location [facility]    Staff Present Jilda Roche, RN, Bjorn Loser, MS, ACSM-CEP, Exercise Physiologist;Heather Zigmund Foster Frericks, Exercise Physiologist    Virtual Visit No    Medication changes reported     No    Fall or balance concerns reported    Yes    Comments Patient states he loses his balance frequently.    Tobacco Cessation No Change    Warm-up and Cool-down Performed as group-led instruction    Resistance Training Performed Yes    VAD Patient? No    PAD/SET Patient? No      Pain Assessment   Currently in Pain? No/denies    Pain Score 0-No pain             Capillary Blood Glucose: No results found for this or any previous visit (from the past 24 hour(s)).    Social History   Tobacco Use  Smoking Status Former   Packs/day: 1.50   Years: 25.00   Pack years: 37.50   Types: Cigarettes   Quit date: 05/07/2021   Years since quitting: 0.2  Smokeless Tobacco Current    Goals Met:  Independence with exercise equipment Exercise tolerated well Strength training completed today  Goals Unmet:  Not Applicable  Comments: checkout 1030    Dr. Carlyle Dolly is Medical Director for [place]

## 2021-07-22 ENCOUNTER — Encounter (HOSPITAL_COMMUNITY)
Admission: RE | Admit: 2021-07-22 | Discharge: 2021-07-22 | Disposition: A | Discharge status: Home / Self Care | Payer: BC Managed Care – PPO | Source: Ambulatory Visit | Attending: Cardiovascular Disease | Admitting: Cardiovascular Disease

## 2021-07-22 DIAGNOSIS — Z955 Presence of coronary angioplasty implant and graft: Secondary | ICD-10-CM | POA: Insufficient documentation

## 2021-07-22 DIAGNOSIS — I213 ST elevation (STEMI) myocardial infarction of unspecified site: Secondary | ICD-10-CM | POA: Diagnosis not present

## 2021-07-22 NOTE — Progress Notes (Signed)
Cardiac Individual Treatment Plan  Patient Details  Name: ERROLL NORSWORTHY MRN: [id number] Date of Birth: 14-Feb-1976 Referring Provider:   [name] from 05/14/2021 in [facility] CARDIAC REHABILITATION  Referring Provider Dr. Excell Seltzer       Initial Encounter Date:  [name] from 05/14/2021 in [place] CARDIAC REHABILITATION  Date 05/14/21       Visit Diagnosis: ST elevation myocardial infarction (STEMI), unspecified artery (HCC)  Patient's Home Medications on Admission:  Current Outpatient Medications:    acetaminophen (TYLENOL) 500 MG tablet, Take 1,000 mg by mouth every 6 (six) hours as needed for mild pain, headache or fever., Disp: , Rfl:    amLODipine (NORVASC) 5 MG tablet, Take 2 tablets (10 mg total) by mouth daily., Disp: 180 tablet, Rfl: 3   aspirin EC 81 MG tablet, Take 81 mg by mouth daily. Swallow whole., Disp: , Rfl:    atorvastatin (LIPITOR) 80 MG tablet, Take 80 mg by mouth every evening., Disp: , Rfl:    betamethasone dipropionate 0.05 % cream, 2 times daily per day X 1 week. (Patient not taking: Reported on 05/06/2021), Disp: 30 g, Rfl: 0   carvedilol (COREG) 6.25 MG tablet, Take 1 tablet (6.25 mg total) by mouth 2 (two) times daily., Disp: 60 tablet, Rfl: 3   diphenhydrAMINE (BENADRYL ALLERGY) 25 mg capsule, Take 1 capsule (25 mg total) by mouth every 6 (six) hours as needed., Disp: 30 capsule, Rfl: 0   escitalopram (LEXAPRO) 10 MG tablet, Take 1 tablet (10 mg total) by mouth daily. (Patient taking differently: Take 10 mg by mouth every evening.), Disp: 90 tablet, Rfl: 1   nicotine (NICODERM CQ - DOSED IN MG/24 HOURS) 14 mg/24hr patch, Place 14 mg onto the skin daily as needed (smoking cessation)., Disp: , Rfl:    nitroGLYCERIN (NITROSTAT) 0.4 MG SL tablet, Place 1 tablet (0.4 mg total) under the tongue every 5 (five) minutes as needed for chest pain., Disp: 25 tablet, Rfl: 12   Omega-3  Fatty Acids (FISH OIL PO), Take 1,200 mg by mouth every evening., Disp: , Rfl:    omeprazole (PRILOSEC) 20 MG capsule, Take 20 mg by mouth daily as needed (indigestion/heartburn.)., Disp: , Rfl:    ticagrelor (BRILINTA) 90 MG TABS tablet, Take 1 tablet (90 mg total) by mouth 2 (two) times daily., Disp: 60 tablet, Rfl: 11  Past Medical History: 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 - Cath: mLAD 20, pRCA 60/70, OM1 99 >> 2.5 x 18 mm Onyx Frontier DES to OM1 // staged PCI 04/03/21: 48 x 3.5 Synergy DES to pRCA; c/b RV branch occlusion and ongoing chest pain, ant STE >> relook cath with patent stents, patent LAD, LM // Echocardiogram 11/22: EF 55-60, no RWMA (difficult windows), Gr 1 DD, normal RVSF // Limited echocardiogram after staged PCI: normal EF and   HLD (hyperlipidemia) 04/04/2021   HTN (hypertension)    Polycythemia    Pre-diabetes 01/04/2017    Tobacco Use: Social History   Tobacco Use  Smoking Status Former   Packs/day: 1.50   Years: 25.00   Pack years: 37.50   Types: Cigarettes   Quit date: 05/07/2021   Years since quitting: 0.2  Smokeless Tobacco Current    Labs: Recent Review Flowsheet Data     Labs for ITP Cardiac and Pulmonary Rehab Latest Ref Rng & Units 09/01/2016 01/04/2017 04/01/2021 04/02/2021 06/16/2021  Cholestrol 100 - 199 mg/dL - - 161(W) 960(A) 540   LDLCALC 0 - 99 mg/dL - - 981(X) 914(N) 53   HDL >39 mg/dL - - 82(N) 56(O) 13(Y)   Trlycerides 0 - 149 mg/dL - - 865(H) 846(N) 629   Hemoglobin A1c 4.8 - 5.6 % 5.5 5.7 6.1(H) - -       Capillary Blood Glucose: Lab Results  Component Value Date   GLUCAP 109 (H) 04/01/2021     Exercise Target Goals: Exercise Program Goal: Individual exercise prescription set using results from initial 6 min walk test and THRR while considering  patients activity barriers and safety.   Exercise Prescription Goal: Starting with aerobic activity 30 plus  minutes a day, 3 days per week for initial exercise prescription. Provide home exercise prescription and guidelines that participant acknowledges understanding prior to discharge.  Activity Barriers & Risk Stratification:  Activity Barriers & Cardiac Risk Stratification - 05/14/21 0852       Activity Barriers & Cardiac Risk Stratification   Activity Barriers Balance Concerns    Cardiac Risk Stratification High             6 Minute Walk:  6 Minute Walk     Row Name 05/14/21 1008         6 Minute Walk   Phase Initial     Distance 1150 feet     Walk Time 6 minutes     # of Rest Breaks 1     MPH 2.2     METS 3.6     RPE 11     VO2 Peak 12.61     Symptoms No     Resting HR 66 bpm     Resting BP 118/60     Resting Oxygen Saturation  98 %     Exercise Oxygen Saturation  during 6 min walk 98 %     Max Ex. HR 72 bpm     Max Ex. BP 120/60     2 Minute Post BP 115/60              Oxygen Initial Assessment:   Oxygen Re-Evaluation:   Oxygen Discharge (Final Oxygen Re-Evaluation):   Initial Exercise Prescription:  Initial Exercise Prescription - 05/14/21 1000       Date of Initial Exercise RX and Referring Provider   Date 05/14/21    Referring Provider Dr. Excell Seltzer    Expected Discharge Date 08/10/21      Treadmill   MPH 1.4    Grade 0    Minutes 17      Elliptical   Level 1    Speed 60    Minutes 22      Prescription Details   Frequency (times per week) 3    Duration Progress to 30 minutes of continuous aerobic without signs/symptoms of physical distress      Intensity   THRR 40-80% of Max Heartrate 70-141    Ratings of Perceived Exertion 11-13    Perceived Dyspnea 0-4      Resistance Training   Training Prescription Yes    Weight 4    Reps 10-15             Perform Capillary Blood Glucose checks as needed.  Exercise Prescription Changes:   Exercise Prescription Changes     Row Name 05/22/21 1259 06/08/21 1300 06/19/21 1000  06/22/21 1300 07/03/21 1427     Response to Exercise   Blood Pressure (Admit) 115/70 114/78 --  118/78 110/74   Blood Pressure (Exercise) 120/78 130/84 -- 110/80 128/78   Blood Pressure (Exit) 120/65 115/60 -- 110/80 120/78   Heart Rate (Admit) 64 bpm 55 bpm -- 70 bpm 78 bpm   Heart Rate (Exercise) 81 bpm 67 bpm -- 88 bpm 94 bpm   Heart Rate (Exit) 67 bpm 63 bpm -- 75 bpm 84 bpm   Rating of Perceived Exertion (Exercise) 12 12 -- 13 13   Duration Continue with 30 min of aerobic exercise without signs/symptoms of physical distress. Continue with 30 min of aerobic exercise without signs/symptoms of physical distress. -- Continue with 30 min of aerobic exercise without signs/symptoms of physical distress. Continue with 30 min of aerobic exercise without signs/symptoms of physical distress.   Intensity THRR unchanged THRR unchanged -- THRR unchanged THRR unchanged     Progression   Progression Continue to progress workloads to maintain intensity without signs/symptoms of physical distress. Continue to progress workloads to maintain intensity without signs/symptoms of physical distress. -- Continue to progress workloads to maintain intensity without signs/symptoms of physical distress. Continue to progress workloads to maintain intensity without signs/symptoms of physical distress.     Resistance Training   Training Prescription Yes Yes -- Yes Yes   Weight 5 5 -- 5 8   Reps 10-15 10-15 -- 10-15 10-15   Time 10 Minutes 10 Minutes -- 10 Minutes 10 Minutes     Treadmill   MPH 1.5 2 -- 2.3 2.5   Grade 0 0 -- 1 1   Minutes 17 17 -- 17 17   METs 2.15 2.53 -- 3.08 3.26     Elliptical   Level 2 2 -- 6 2   Speed 25 45 -- 60 61   Minutes 22 22 -- 22 22   METs 1 2.4 -- 3.9 3.8     Home Exercise Plan   Plans to continue exercise at -- -- Home (comment) -- --   Frequency -- -- Add 2 additional days to program exercise sessions. -- --   Initial Home Exercises Provided -- -- 06/19/21 -- --    Row  Name 07/20/21 1500             Response to Exercise   Blood Pressure (Admit) 110/72       Blood Pressure (Exercise) 130/68       Blood Pressure (Exit) 118/68       Heart Rate (Admit) 69 bpm       Heart Rate (Exercise) 94 bpm       Heart Rate (Exit) 78 bpm       Rating of Perceived Exertion (Exercise) 12       Duration Continue with 30 min of aerobic exercise without signs/symptoms of physical distress.       Intensity THRR unchanged         Progression   Progression Continue to progress workloads to maintain intensity without signs/symptoms of physical distress.         Resistance Training   Training Prescription Yes       Weight 10       Reps 10-15       Time 10 Minutes         Treadmill   MPH 2.7       Grade 1       Minutes 17       METs 3.44         Elliptical   Level 6  Speed 69       Minutes 22       METs 4.5                Exercise Comments:   Exercise Comments     Row Name 06/19/21 1004           Exercise Comments home exercise reviewed                Exercise Goals and Review:   Exercise Goals     Row Name 05/14/21 1011 05/22/21 1300 06/22/21 1345 07/20/21 1549       Exercise Goals   Increase Physical Activity Yes Yes Yes Yes    Intervention Provide advice, education, support and counseling about physical activity/exercise needs.;Develop an individualized exercise prescription for aerobic and resistive training based on initial evaluation findings, risk stratification, comorbidities and participant's personal goals. Provide advice, education, support and counseling about physical activity/exercise needs.;Develop an individualized exercise prescription for aerobic and resistive training based on initial evaluation findings, risk stratification, comorbidities and participant's personal goals. Provide advice, education, support and counseling about physical activity/exercise needs.;Develop an individualized exercise prescription for  aerobic and resistive training based on initial evaluation findings, risk stratification, comorbidities and participant's personal goals. Provide advice, education, support and counseling about physical activity/exercise needs.;Develop an individualized exercise prescription for aerobic and resistive training based on initial evaluation findings, risk stratification, comorbidities and participant's personal goals.    Expected Outcomes Short Term: Attend rehab on a regular basis to increase amount of physical activity.;Long Term: Add in home exercise to make exercise part of routine and to increase amount of physical activity.;Long Term: Exercising regularly at least 3-5 days a week. Short Term: Attend rehab on a regular basis to increase amount of physical activity.;Long Term: Add in home exercise to make exercise part of routine and to increase amount of physical activity.;Long Term: Exercising regularly at least 3-5 days a week. Short Term: Attend rehab on a regular basis to increase amount of physical activity.;Long Term: Add in home exercise to make exercise part of routine and to increase amount of physical activity.;Long Term: Exercising regularly at least 3-5 days a week. Short Term: Attend rehab on a regular basis to increase amount of physical activity.;Long Term: Add in home exercise to make exercise part of routine and to increase amount of physical activity.;Long Term: Exercising regularly at least 3-5 days a week.    Increase Strength and Stamina Yes Yes Yes --    Intervention Provide advice, education, support and counseling about physical activity/exercise needs.;Develop an individualized exercise prescription for aerobic and resistive training based on initial evaluation findings, risk stratification, comorbidities and participant's personal goals. Provide advice, education, support and counseling about physical activity/exercise needs.;Develop an individualized exercise prescription for aerobic  and resistive training based on initial evaluation findings, risk stratification, comorbidities and participant's personal goals. Provide advice, education, support and counseling about physical activity/exercise needs.;Develop an individualized exercise prescription for aerobic and resistive training based on initial evaluation findings, risk stratification, comorbidities and participant's personal goals. Provide advice, education, support and counseling about physical activity/exercise needs.;Develop an individualized exercise prescription for aerobic and resistive training based on initial evaluation findings, risk stratification, comorbidities and participant's personal goals.    Expected Outcomes Short Term: Increase workloads from initial exercise prescription for resistance, speed, and METs.;Short Term: Perform resistance training exercises routinely during rehab and add in resistance training at home;Long Term: Improve cardiorespiratory fitness, muscular endurance and strength as measured by increased METs  and functional capacity ( ) Short Term: Increase workloads from initial exercise prescription for resistance, speed, and METs.;Short Term: Perform resistance training exercises routinely during rehab and add in resistance training at home;Long Term: Improve cardiorespiratory fitness, muscular endurance and strength as measured by increased METs and functional capacity ( ) Short Term: Increase workloads from initial exercise prescription for resistance, speed, and METs.;Short Term: Perform resistance training exercises routinely during rehab and add in resistance training at home;Long Term: Improve cardiorespiratory fitness, muscular endurance and strength as measured by increased METs and functional capacity ( ) Short Term: Increase workloads from initial exercise prescription for resistance, speed, and METs.;Short Term: Perform resistance training exercises routinely during rehab and add in  resistance training at home;Long Term: Improve cardiorespiratory fitness, muscular endurance and strength as measured by increased METs and functional capacity ( )    Able to understand and use rate of perceived exertion (RPE) scale Yes Yes Yes Yes    Intervention Provide education and explanation on how to use RPE scale Provide education and explanation on how to use RPE scale Provide education and explanation on how to use RPE scale Provide education and explanation on how to use RPE scale    Expected Outcomes Short Term: Able to use RPE daily in rehab to express subjective intensity level;Long Term:  Able to use RPE to guide intensity level when exercising independently Short Term: Able to use RPE daily in rehab to express subjective intensity level;Long Term:  Able to use RPE to guide intensity level when exercising independently Short Term: Able to use RPE daily in rehab to express subjective intensity level;Long Term:  Able to use RPE to guide intensity level when exercising independently Short Term: Able to use RPE daily in rehab to express subjective intensity level;Long Term:  Able to use RPE to guide intensity level when exercising independently    Knowledge and understanding of Target Heart Rate Range (THRR) Yes Yes Yes Yes    Intervention Provide education and explanation of THRR including how the numbers were predicted and where they are located for reference Provide education and explanation of THRR including how the numbers were predicted and where they are located for reference Provide education and explanation of THRR including how the numbers were predicted and where they are located for reference Provide education and explanation of THRR including how the numbers were predicted and where they are located for reference    Expected Outcomes Short Term: Able to state/look up THRR;Long Term: Able to use THRR to govern intensity when exercising independently;Short Term: Able to use daily as  guideline for intensity in rehab Short Term: Able to state/look up THRR;Long Term: Able to use THRR to govern intensity when exercising independently;Short Term: Able to use daily as guideline for intensity in rehab Short Term: Able to state/look up THRR;Long Term: Able to use THRR to govern intensity when exercising independently;Short Term: Able to use daily as guideline for intensity in rehab Short Term: Able to state/look up THRR;Long Term: Able to use THRR to govern intensity when exercising independently;Short Term: Able to use daily as guideline for intensity in rehab    Able to check pulse independently Yes Yes Yes Yes    Intervention Provide education and demonstration on how to check pulse in carotid and radial arteries.;Review the importance of being able to check your own pulse for safety during independent exercise Provide education and demonstration on how to check pulse in carotid and radial arteries.;Review the importance of being able to check your  own pulse for safety during independent exercise Provide education and demonstration on how to check pulse in carotid and radial arteries.;Review the importance of being able to check your own pulse for safety during independent exercise Provide education and demonstration on how to check pulse in carotid and radial arteries.;Review the importance of being able to check your own pulse for safety during independent exercise    Expected Outcomes Short Term: Able to explain why pulse checking is important during independent exercise;Long Term: Able to check pulse independently and accurately Short Term: Able to explain why pulse checking is important during independent exercise;Long Term: Able to check pulse independently and accurately Short Term: Able to explain why pulse checking is important during independent exercise;Long Term: Able to check pulse independently and accurately Short Term: Able to explain why pulse checking is important during  independent exercise;Long Term: Able to check pulse independently and accurately    Understanding of Exercise Prescription Yes Yes Yes Yes    Intervention Provide education, explanation, and written materials on patient's individual exercise prescription Provide education, explanation, and written materials on patient's individual exercise prescription Provide education, explanation, and written materials on patient's individual exercise prescription Provide education, explanation, and written materials on patient's individual exercise prescription    Expected Outcomes Short Term: Able to explain program exercise prescription;Long Term: Able to explain home exercise prescription to exercise independently Short Term: Able to explain program exercise prescription;Long Term: Able to explain home exercise prescription to exercise independently Short Term: Able to explain program exercise prescription;Long Term: Able to explain home exercise prescription to exercise independently Short Term: Able to explain program exercise prescription;Long Term: Able to explain home exercise prescription to exercise independently             Exercise Goals Re-Evaluation :  Exercise Goals Re-Evaluation     Row Name 05/22/21 1301 06/22/21 1346 07/20/21 1550 07/20/21 1556       Exercise Goal Re-Evaluation   Exercise Goals Review Increase Physical Activity;Increase Strength and Stamina;Able to understand and use rate of perceived exertion (RPE) scale;Knowledge and understanding of Target Heart Rate Range (THRR);Able to check pulse independently;Understanding of Exercise Prescription Increase Physical Activity;Increase Strength and Stamina;Able to understand and use rate of perceived exertion (RPE) scale;Knowledge and understanding of Target Heart Rate Range (THRR);Able to check pulse independently;Understanding of Exercise Prescription Increase Physical Activity;Increase Strength and Stamina;Able to understand and use  rate of perceived exertion (RPE) scale;Knowledge and understanding of Target Heart Rate Range (THRR);Able to check pulse independently;Understanding of Exercise Prescription --    Comments Pt has completed sessions of cardiac rehab. He is motivated during class. He could push himslef harded during class with his workload. He is currently exercising at 2.15 METs on the TM. Will continue to montior and progress as able. Pt has completed 14 sessions of cardiac rehab. He has been increasing his workloads and seems to be more interested in the program than he was at the beginning. He is progressing well and has been cleared to return to his normal work duties next week. He is currently exercising at 3.9 METs. Will continue to monitor and progress as able. Pt has completed 23 sessions of cardiac rehab. He continues to increase his workload and is motivated during the session. He is currently exercising at 4.5 METs on the ellp. Will continue to monitor and progress as able. --    Expected Outcomes Through exercise at rehab and at home, the patient will meet their stated goals. Through exercise at  rehab and at home, the patient will meet their stated goals. Through exercise at rehab and at home, the patient will meet their stated goals. Through exercise at rehab and at home, the patient will meet their stated goals.              Discharge Exercise Prescription (Final Exercise Prescription Changes):  Exercise Prescription Changes - 07/20/21 1500       Response to Exercise   Blood Pressure (Admit) 110/72    Blood Pressure (Exercise) 130/68    Blood Pressure (Exit) 118/68    Heart Rate (Admit) 69 bpm    Heart Rate (Exercise) 94 bpm    Heart Rate (Exit) 78 bpm    Rating of Perceived Exertion (Exercise) 12    Duration Continue with 30 min of aerobic exercise without signs/symptoms of physical distress.    Intensity THRR unchanged      Progression   Progression Continue to progress workloads to maintain  intensity without signs/symptoms of physical distress.      Resistance Training   Training Prescription Yes    Weight 10    Reps 10-15    Time 10 Minutes      Treadmill   MPH 2.7    Grade 1    Minutes 17    METs 3.44      Elliptical   Level 6    Speed 69    Minutes 22    METs 4.5             Nutrition:  Target Goals: Understanding of nutrition guidelines, daily intake of sodium 1500mg , cholesterol 200mg , calories 30% from fat and 7% or less from saturated fats, daily to have 5 or more servings of fruits and vegetables.  Biometrics:  Pre Biometrics - 05/14/21 1012       Pre Biometrics   Height 5\' 11"  (1.803 m)    Weight 98.9 kg    Waist Circumference 41 inches    Hip Circumference 42 inches    Waist to Hip Ratio 0.98 %    BMI (Calculated) 30.42    Triceps Skinfold 5 mm    % Body Fat 23.5 %    Grip Strength 53.6 kg    Flexibility 14 in    Single Leg Stand 30 seconds              Nutrition Therapy Plan and Nutrition Goals:  Nutrition Therapy & Goals - 05/14/21 0931       Personal Nutrition Goals   Comments Patient scored 42 on his diet assessment. Handout provided and explained regarding healthier choices. Score also discussed. We offer 2 educational sessions on  heart healthy nutrition with handouts and assistance with RD referral if patient is interested. Patient verbaized understanding.      Intervention Plan   Intervention Nutrition handout(s) given to patient.    Expected Outcomes Short Term Goal: Understand basic principles of dietary content, such as calories, fat, sodium, cholesterol and nutrients.             Nutrition Assessments:  Nutrition Assessments - 05/14/21 0933       MEDFICTS Scores   Pre Score 42            MEDIFICTS Score Key: ?70 Need to make dietary changes  40-70 Heart Healthy Diet ? 40 Therapeutic Level Cholesterol Diet   Picture Your Plate Scores: 05/16/21 Unhealthy dietary pattern with much room for  improvement. 41-50 Dietary pattern unlikely to meet recommendations for good health  and room for improvement. 51-60 More healthful dietary pattern, with some room for improvement.  >60 Healthy dietary pattern, although there may be some specific behaviors that could be improved.    Nutrition Goals Re-Evaluation:   Nutrition Goals Discharge (Final Nutrition Goals Re-Evaluation):   Psychosocial: Target Goals: Acknowledge presence or absence of significant depression and/or stress, maximize coping skills, provide positive support system. Participant is able to verbalize types and ability to use techniques and skills needed for reducing stress and depression.  Initial Review & Psychosocial Screening:  Initial Psych Review & Screening - 05/14/21 0939       Initial Review   Current issues with Current Anxiety/Panic      Family Dynamics   Good Support System? Yes      Barriers   Psychosocial barriers to participate in program The patient should benefit from training in stress management and relaxation.;Psychosocial barriers identified (see note)      Screening Interventions   Interventions Encouraged to exercise;Provide feedback about the scores to participant    Expected Outcomes Short Term goal: Identification and review with participant of any Quality of Life or Depression concerns found by scoring the questionnaire.             Quality of Life Scores:  Quality of Life - 05/14/21 1013       Quality of Life   Select Quality of Life      Quality of Life Scores   Health/Function Pre 15.47 %    Socioeconomic Pre 19.5 %    Psych/Spiritual Pre 20.36 %    Family Pre 22.4 %    GLOBAL Pre 18.32 %            Scores of 19 and below usually indicate a poorer quality of life in these areas.  A difference of  2-3 points is a clinically meaningful difference.  A difference of 2-3 points in the total score of the Quality of Life Index has been associated with significant  improvement in overall quality of life, self-image, physical symptoms, and general health in studies assessing change in quality of life.  PHQ-9: Recent Review Flowsheet Data     Depression screen [facility] 2/9 06/25/2021 06/25/2021 05/14/2021 05/05/2021 05/05/2021   Decreased Interest 0 0 1 0 0   Down, Depressed, Hopeless 0 0 0 0 0   PHQ - 2 Score 0 0 1 0 0   Altered sleeping 1 - 2 1 -   Tired, decreased energy 1 - 1 1 -   Change in appetite 0 - 1 0 -   Feeling bad or failure about yourself  0 - 1 0 -   Trouble concentrating 0 - 0 0 -   Moving slowly or fidgety/restless 0 - 0 0 -   Suicidal thoughts 0 - 0 0 -   PHQ-9 Score 2 - 6 2 -   Difficult doing work/chores Not difficult at all - Somewhat difficult Not difficult at all -      Interpretation of Total Score  Total Score Depression Severity:  1-4 = Minimal depression, 5-9 = Mild depression, 10-14 = Moderate depression, 15-19 = Moderately severe depression, 20-27 = Severe depression   Psychosocial Evaluation and Intervention:  Psychosocial Evaluation - 05/14/21 0941       Psychosocial Evaluation & Interventions   Interventions Stress management education;Relaxation education;Encouraged to exercise with the program and follow exercise prescription    Comments Patient has anxiety which he says he believes he has had a  long time but he was able to manage. 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 He says he wants to try it and see if he can. His pcp started him on Lexapro a few days ago to help manage his anxiety. He says it may have improved some but he understands it will take 2 to 4 weeks for Lexapro to make a difference. His PHQ-9 score was 6 and his overall QOL was 18.32% socring lowest in health/function at 15.47%. 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 He believes  when he is able to get back to work full time he will be able to manage the anxiety better. He has also recently quit smoking 05/07/21 which is increasing his anxiety. He has 7 children from 32 to 8 years old. His wife is a Engineer, civil (consulting) and he names her as his support person. He wants to try the program hoping it will get him back to normal and help get his anxierty better managed.    Expected Outcomes Patient's anxiety will be managed better with medication and exercise and he will be able to exercise wearing a mask.    Continue Psychosocial Services  Follow up required by staff             Psychosocial Re-Evaluation:  Psychosocial Re-Evaluation     Row Name 05/15/21 0715 06/15/21 1336 06/17/21 1138 07/15/21 0801       Psychosocial Re-Evaluation   Current issues with Current Anxiety/Panic Current Anxiety/Panic Current Anxiety/Panic Current Anxiety/Panic    Comments Patient new to the progam and plans to start 05/20/21. We will continue to monitor his progress in the program. Patient has completed 10 sessions and continues to have no psychosocial barriers identified. He initially thought he would not be able to participate in the program due to increased anxiety with wearing a mask but he has not had any difficulty wearing mask during sessions. He seems to enjoy coming to the program interacting with other patients and demonstrates an interest in improving his health. He continues on Lexapro for anxiety and feels it is managed. We will continue to monitor. Patient has completed 10 sessions and continues to have no psychosocial barriers identified. He initially thought he would not be able to participate in the program due to increased anxiety with wearing a mask but he has not had any difficulty wearing mask during sessions. He seems to enjoy coming to the program interacting with other patients and demonstrates an interest in improving his health. He continues on Lexapro for anxiety. He says his anxiety  has improved a lot. He attributes the improvement to the medication, going back to work, and doing the program. He is pleased with his progress. He says he feels better than he has felt in years. We will continue to monitor. Patient has completed 20 sessions and continues to have no psychosocial barriers identified. Patient continues to enjoy coming to the program interacting with other patients and demonstrates an interest in improving his health. He continues on Lexapro for anxiety. He saw his pcp 2/2 and reported that his anxiety has improved a lot. He attributes the improvement to the medication, going back to work, and doing the program.  We will continue to monitor.    Expected Outcomes Patient will continue to have no psychosocial  barriers identified and his anxiety will be managed. Patient will continue to have no psychosocial barriers identified and his anxiety will be managed. Patient will continue to have no psychosocial barriers identified and his anxiety will be managed. Patient will continue to have no psychosocial barriers identified and his anxiety will be managed.    Interventions Stress management education;Encouraged to attend Cardiac Rehabilitation for the exercise;Relaxation education Stress management education;Encouraged to attend Cardiac Rehabilitation for the exercise;Relaxation education Stress management education;Encouraged to attend Cardiac Rehabilitation for the exercise;Relaxation education Stress management education;Encouraged to attend Cardiac Rehabilitation for the exercise;Relaxation education    Continue Psychosocial Services  No Follow up required No Follow up required No Follow up required No Follow up required             Psychosocial Discharge (Final Psychosocial Re-Evaluation):  Psychosocial Re-Evaluation - 07/15/21 0801       Psychosocial Re-Evaluation   Current issues with Current Anxiety/Panic    Comments Patient has completed 20 sessions and continues to  have no psychosocial barriers identified. Patient continues to enjoy coming to the program interacting with other patients and demonstrates an interest in improving his health. He continues on Lexapro for anxiety. He saw his pcp 2/2 and reported that his anxiety has improved a lot. He attributes the improvement to the medication, going back to work, and doing the program.  We will continue to monitor.    Expected Outcomes Patient will continue to have no psychosocial barriers identified and his anxiety will be managed.    Interventions Stress management education;Encouraged to attend Cardiac Rehabilitation for the exercise;Relaxation education    Continue Psychosocial Services  No Follow up required             Vocational Rehabilitation: Provide vocational rehab assistance to qualifying candidates.   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 He does not need vocational rehab.             Education: Education Goals: Education classes will be provided on a weekly basis, covering required topics. Participant will state understanding/return demonstration of topics presented.  Learning Barriers/Preferences:  Learning Barriers/Preferences - 05/14/21 0935       Learning Barriers/Preferences   Learning Barriers None    Learning Preferences Written Material             Education Topics: Hypertension, Hypertension Reduction -Define heart disease and high blood pressure. Discus how high blood pressure affects the body and ways to reduce high blood pressure. Flowsheet Row CARDIAC REHAB PHASE II EXERCISE [facility] [place] [facility]  Date 06/03/21  Educator DF  Instruction Review Code 2-  Demonstrated Understanding       Exercise and Your Heart -Discuss why it is important to exercise, the FITT principles of exercise, normal and abnormal responses to exercise, and how to exercise safely.   Angina -Discuss definition of angina, causes of angina, treatment of angina, and how to decrease risk of having angina. Flowsheet Row CARDIAC REHAB PHASE II EXERCISE from [facility]2021 in [facility]  Date 06/17/21  Educator pb  Instruction Review Code 1- Verbalizes Understanding       Cardiac Medications -Review what the following cardiac medications are used for,  how they affect the body, and side effects that may occur when taking the medications.  Medications include Aspirin, Beta blockers, calcium channel blockers, ACE Inhibitors, angiotensin receptor blockers, diuretics, digoxin, and antihyperlipidemics.   Congestive Heart Failure -Discuss the definition of CHF, how to live with CHF, the signs and symptoms of CHF, and how keep track of weight and sodium intake.   Heart Disease and Intimacy -Discus the effect sexual activity has on the heart, how changes occur during intimacy as we age, and safety during sexual activity. Flowsheet Row CARDIAC REHAB PHASE II EXERCISE from 07/15/2021 in [place] [facility]  Date 07/01/21  Educator hj  Instruction Review Code 2- Demonstrated Understanding       Smoking Cessation / COPD -Discuss different methods to quit smoking, the health benefits of quitting smoking, and the definition of COPD. Flowsheet Row CARDIAC REHAB PHASE II EXERCISE from 07/15/2021 in [place] [facility]  Date 07/08/21  Educator DF  Instruction Review Code 2- Demonstrated Understanding       Nutrition I: Fats -Discuss the types of cholesterol, what cholesterol does to the heart, and how cholesterol levels can be controlled. Flowsheet Row CARDIAC REHAB PHASE II EXERCISE from 07/15/2021 in [place] [facility]  Date 07/15/21  Educator HJ  Instruction Review Code 1- Verbalizes Understanding       Nutrition II: Labels -Discuss the different components of food labels and how to read food label   Heart Parts/Heart Disease and PAD -Discuss the anatomy of the heart, the pathway of blood circulation through the heart, and these are affected by heart disease.   Stress I: Signs and Symptoms -Discuss the causes of stress, how stress may lead to anxiety and depression, and ways to limit stress.   Stress II: Relaxation -Discuss different types of relaxation techniques to limit stress. Flowsheet Row CARDIAC REHAB PHASE II EXERCISE from 07/15/2021 in [place] [facility]  Date 05/20/21  Educator hj  Instruction Review Code 2- Demonstrated Understanding       Warning Signs of Stroke / TIA -Discuss definition of a stroke, what the signs and symptoms are of a stroke, and how to identify when someone is having stroke. Flowsheet Row CARDIAC REHAB PHASE II EXERCISE from 07/15/2021 in [place] [facility]  Date 05/27/21  Educator pb  Instruction Review Code 1- Verbalizes Understanding       Knowledge Questionnaire Score:  Knowledge Questionnaire Score - 05/14/21 0933       Knowledge Questionnaire Score   Pre Score 23/28             Core Components/Risk Factors/Patient Goals at Admission:  Personal Goals and Risk Factors at Admission - 05/14/21 0936       Core Components/Risk Factors/Patient Goals on Admission    Weight Management Obesity    Lipids Yes    Intervention Provide education and support for participant on nutrition & aerobic/resistive exercise along with prescribed medications to achieve LDL 70mg , HDL >40mg .    Expected Outcomes Short Term: Participant states understanding of desired cholesterol values and is compliant with medications prescribed. Participant is following exercise prescription and nutrition guidelines.;Long Term:  Cholesterol controlled with medications as prescribed, with individualized exercise RX and with personalized nutrition plan. Value goals: LDL < 70mg , HDL > 40 mg.    Personal Goal Other Yes    Personal Goal Patient wants to get back to normal; get back to work; and be able to manage his anxiety.    Intervention  Patient will attend CR 3 days/week with exercise and education and supplement with exercise at home.    Expected Outcomes Patient will complete the program meeting both program and personal goals.             Core Components/Risk Factors/Patient Goals Review:   Goals and Risk Factor Review     Row Name 05/15/21 0716 06/15/21 1332 07/15/21 0803         Core Components/Risk Factors/Patient Goals Review   Personal Goals Review Weight Management/Obesity;Lipids;Other Weight Management/Obesity;Lipids;Other Weight Management/Obesity;Lipids;Other     Review Patient referred to CR with STEMI. He has multiple risk factors for CAD and is participating in the program for risk modification. He plans to start the program 05/20/21. His personal goals for the program are to return to his normal life; be able to return to work and get stronger. We will monitor his progress as he works towards meeting these goals. Patient has completed 10 sessions with his current weight at 218.0 lbs down from his initial weight of 220.5 lbs. He is doing well in the program with progressions and consistent attendance. His blood pressure is at and below goal. He works hard during sessions. His personal goals continue to be to be able to return to normal activities and work and be able to manage his anxiety better. We will continue to monitor as he works towards meeting these goals. Patient has completed 20 sessions with his current weight at 223.0 up 5 lbs since last 30 day review. He continues to do very well in the program with progressions and consistent attendance. His blood pressure continues to be well controlled. He  works hard during sessions. His personal goals continue to be to be able to return to normal activities and work and be able to manage his anxiety better. He states he feels better than he has felt in years. 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 He is glad to be able to return to his job. He feels he is ready. We will continue to monitor as he works towards meeting these goals.     Expected Outcomes Patient will complete the program meeting both personal and program goals. Patient will complete the program meeting both personal and program goals. Patient will complete the program meeting both personal and program goals.              Core Components/Risk Factors/Patient Goals at Discharge (Final Review):   Goals and Risk Factor Review - 07/15/21 0803       Core Components/Risk Factors/Patient Goals Review   Personal Goals Review Weight Management/Obesity;Lipids;Other    Review Patient has completed 20 sessions with his current weight at 223.0 up 5 lbs since last 30 day review. He continues to do very well in the program with progressions and consistent attendance. His blood pressure continues to be well controlled. He works hard during sessions. His personal goals continue to be to be able to return to normal activities and work and be able to manage his anxiety better. He states he feels better than he has felt in years. 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 He is glad to be able to return to his job. He feels he is ready. We will continue to monitor as he works towards meeting these goals.    Expected Outcomes Patient will complete  the program meeting both personal and program goals.             ITP Comments:   Comments: ITP REVIEW Pt is making expected progress toward Cardiac Rehab goals after completing 23 sessions. Recommend continued exercise, life style  modification, education, and increased stamina and strength.

## 2021-07-22 NOTE — Progress Notes (Addendum)
Daily Session Note ? ?Patient Details  ?Name: Corey Robinson ?MRN: [id number] ?Date of Birth: 1975-10-28 ?Referring Provider:   ?[name] from 05/14/2021 in [place]  ?Referring Provider Dr. Burt Knack  ? ?  ? ? ?Encounter Date: 07/22/2021 ? ?Check In: ? Session Check In - 07/22/21 0930   ? ?  ? Check-In  ? Supervising physician immediately available to respond to emergencies [facility] MD immediately available   ? Physician(s) Dr Harl Bowie   ? Location [facility]   ? Staff Present Hoy Register, MS, ACSM-CEP, Exercise Physiologist;Heather Otho Ket, BS, Exercise Physiologist;Rasaan Brotherton Wynetta Emery, RN, BSN   ? Virtual Visit No   ? Medication changes reported     No   ? Fall or balance concerns reported    Yes   ? Comments Patient states he loses his balance frequently.   ? Tobacco Cessation No Change   ? Warm-up and Cool-down Performed as group-led instruction   ? Resistance Training Performed Yes   ? VAD Patient? No   ? PAD/SET Patient? No   ?  ? Pain Assessment  ? Currently in Pain? No/denies   ? Pain Score 0-No pain   ? Multiple Pain Sites No   ? ?  ?  ? ?  ? ? ?Capillary Blood Glucose: ?No results found for this or any previous visit (from the past 24 hour(s)). ? ? ? ?Social History  ? ?Tobacco Use  ?Smoking Status Former  ? Packs/day: 1.50  ? Years: 25.00  ? Pack years: 37.50  ? Types: Cigarettes  ? Quit date: 05/07/2021  ? Years since quitting: 0.2  ?Smokeless Tobacco Current  ? ? ?Goals Met:  ?Independence with exercise equipment ?Personal goals reviewed ?No report of concerns or symptoms today ?Strength training completed today ? ?Goals Unmet:  ?Not Applicable ? ?Comments: Check out 0930 ? ? ?Dr. Carlyle Dolly is Medical Director for [place] ?

## 2021-07-24 ENCOUNTER — Encounter (HOSPITAL_COMMUNITY)
Admission: RE | Admit: 2021-07-24 | Discharge: 2021-07-24 | Disposition: A | Discharge status: Home / Self Care | Payer: BC Managed Care – PPO | Source: Ambulatory Visit | Attending: Cardiovascular Disease | Admitting: Cardiovascular Disease

## 2021-07-24 DIAGNOSIS — Z955 Presence of coronary angioplasty implant and graft: Secondary | ICD-10-CM | POA: Diagnosis not present

## 2021-07-24 DIAGNOSIS — I213 ST elevation (STEMI) myocardial infarction of unspecified site: Secondary | ICD-10-CM

## 2021-07-24 NOTE — Progress Notes (Signed)
Daily Session Note ? ?Patient Details  ?Name: Corey Robinson ?MRN: [id number] ?Date of Birth: May 29, 1975 ?Referring Provider:   ?[name] from 05/14/2021 in [place]  ?Referring Provider Dr. Burt Knack  ? ?  ? ? ?Encounter Date: 07/24/2021 ? ?Check In: ? Session Check In - 07/24/21 0930   ? ?  ? Check-In  ? Supervising physician immediately available to respond to emergencies [facility] MD immediately available   ? Physician(s) Dr Harl Bowie   ? Location [facility]   ? Staff Present Hoy Register, MS, ACSM-CEP, Exercise Physiologist;Debra Wynetta Emery, RN, BSN;Olusegun Gerstenberger, RN   ? Virtual Visit No   ? Medication changes reported     No   ? Fall or balance concerns reported    Yes   ? Comments Patient states he loses his balance frequently.   ? Tobacco Cessation No Change   ? Warm-up and Cool-down Performed as group-led instruction   ? Resistance Training Performed Yes   ? VAD Patient? No   ? PAD/SET Patient? No   ?  ? Pain Assessment  ? Currently in Pain? No/denies   ? Pain Score 0-No pain   ? Multiple Pain Sites No   ? ?  ?  ? ?  ? ? ?Capillary Blood Glucose: ?No results found for this or any previous visit (from the past 24 hour(s)). ? ? ? ?Social History  ? ?Tobacco Use  ?Smoking Status Former  ? Packs/day: 1.50  ? Years: 25.00  ? Pack years: 37.50  ? Types: Cigarettes  ? Quit date: 05/07/2021  ? Years since quitting: 0.2  ?Smokeless Tobacco Current  ? ? ?Goals Met:  ?Independence with exercise equipment ?Exercise tolerated well ?No report of concerns or symptoms today ?Strength training completed today ? ?Goals Unmet:  ?Not Applicable ? ?Comments: check out @ 10:30am ? ? ?Dr. Carlyle Dolly is Medical Director for [place] ?

## 2021-07-27 ENCOUNTER — Encounter (HOSPITAL_COMMUNITY)
Admission: RE | Admit: 2021-07-27 | Discharge: 2021-07-27 | Disposition: A | Discharge status: Home / Self Care | Payer: BC Managed Care – PPO | Source: Ambulatory Visit | Attending: Cardiovascular Disease | Admitting: Cardiovascular Disease

## 2021-07-27 DIAGNOSIS — I213 ST elevation (STEMI) myocardial infarction of unspecified site: Secondary | ICD-10-CM | POA: Diagnosis not present

## 2021-07-27 DIAGNOSIS — Z955 Presence of coronary angioplasty implant and graft: Secondary | ICD-10-CM

## 2021-07-27 NOTE — Progress Notes (Signed)
?Cardiology Office Note:   ? ?Date:  07/28/2021  ? ?ID:  [name], DOB 09/17/1975, MRN [id number] ? ?PCP:  Mechele Claude, MD  ?[facility] Providers ?Cardiologist:  Tonny Bollman, MD ?Cardiology APP:  Beatrice Lecher, PA-C    ?Referring MD: Mechele Claude, MD  ? ?Chief Complaint:  Follow-up for CAD ?  ? ?Patient Profile: ?Coronary artery disease  ?Inf-lat STEMI 11/22 s/p DES to OM1 ?Staged PCI: DES to RCA; RV br occluded ?Echocardiogram 11/22: EF 55-60 ?C/b drug eruption (? Cause >> Losartan or Metoprolol Tartrate) ?Metoprolol tartrate ? to Carvedilol  ?Hypertension  ?Hyperlipidemia  ?Pre-diabetes  ? ?Prior CV Studies: ?Echocardiogram 04/01/21 ?EF 55-60, no def WMA, Gr 1 DD, normal RVSF ?  ?Cardiac catheterization 04/01/21 ?LAD prox to mid 20 ?OM1 99 ?RCA prox 60, 70 ?PCI: 2.5 x 18 mm Onyx Frontier DES to OM1 ?  ?Cardiac catheterization 04/03/21 ?LAD prox to mid 20 ?OM1 stent patent ?RCA prox 60. 70  ?PCI:  3.5 x 48 mm Synergy DES to RCA  ?C/b RV branch occlusion ?  ?Limited echocardiogram 04/03/21 ?Normal LVF, no RWMA ?  ? ?History of Present Illness:   ?Corey Robinson is a 46 y.o. male with the above problem list.  He was last seen in Nov 2022.  He returns for f/u.  He is here alone.  He has been going to cardiac rehab.  He has been doing well without exertional chest discomfort or significant shortness of breath.  He has an occasional pain in his chest that seems to be musculoskeletal.  It is not like his previous angina.  He has not had syncope, leg edema.  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  There is a fitness test that he will do soon.  He was questioning whether or not his heart rate will increase for this.  I explained to him that carvedilol will suppress his heart rate.  If he needs to have a formal stress test in our office, we can certainly arrange that.    ?   ?Past Medical History:  ?Diagnosis Date  ? CAD (coronary artery disease) 04/03/2021  ? Inf-lat STEMI 04/01/21 - Cath: mLAD  20, pRCA 60/70, OM1 99 >> 2.5 x 18 mm Onyx Frontier DES to OM1 // staged PCI 04/03/21: 48 x 3.5 Synergy DES to pRCA; c/b RV branch occlusion and ongoing chest pain, ant STE >> relook cath with patent stents, patent LAD, LM // Echocardiogram 11/22: EF 55-60, no RWMA (difficult windows), Gr 1 DD, normal RVSF // Limited echocardiogram after staged PCI: normal EF and  ? HLD (hyperlipidemia) 04/04/2021  ? HTN (hypertension)   ? Polycythemia   ? Pre-diabetes 01/04/2017  ? ?Current Medications: ?Current Meds  ?Medication Sig  ? acetaminophen (TYLENOL) 500 MG tablet Take 1,000 mg by mouth every 6 (six) hours as needed for mild pain, headache or fever.  ? aspirin EC 81 MG tablet Take 81 mg by mouth daily. Swallow whole.  ? betamethasone dipropionate 0.05 % cream 2 times daily per day X 1 week.  ? diphenhydrAMINE (BENADRYL ALLERGY) 25 mg capsule Take 1 capsule (25 mg total) by mouth every 6 (six) hours as needed.  ? escitalopram (LEXAPRO) 10 MG tablet Take 1 tablet (10 mg total) by mouth daily.  ? nicotine (NICODERM CQ - DOSED IN MG/24 HOURS) 14 mg/24hr patch Place 14 mg onto the skin daily as needed (smoking cessation).  ? nitroGLYCERIN (NITROSTAT) 0.4 MG SL tablet Place 1  tablet (0.4 mg total) under the tongue every 5 (five) minutes as needed for chest pain.  ? Omega-3 Fatty Acids (FISH OIL PO) Take 1,200 mg by mouth every evening.  ? omeprazole (PRILOSEC) 20 MG capsule Take 20 mg by mouth daily as needed (indigestion/heartburn.).  ? [DISCONTINUED] amLODipine (NORVASC) 5 MG tablet Take 2 tablets (10 mg total) by mouth daily.  ? [DISCONTINUED] atorvastatin (LIPITOR) 80 MG tablet Take 80 mg by mouth every evening.  ? [DISCONTINUED] carvedilol (COREG) 6.25 MG tablet Take 1 tablet (6.25 mg total) by mouth 2 (two) times daily.  ? [DISCONTINUED] ticagrelor (BRILINTA) 90 MG TABS tablet Take 1 tablet (90 mg total) by mouth 2 (two) times daily.  ?  ?Allergies:   Losartan and Metoprolol tartrate  ? ?Social History  ? ?Tobacco Use  ?  Smoking status: Every Day  ?  Packs/day: 0.50  ?  Years: 25.00  ?  Pack years: 12.50  ?  Types: Cigarettes  ? Smokeless tobacco: Current  ?  ?Family Hx: ?The patient's family history is not on file. ? ?Review of Systems  ?Hematologic/Lymphatic: Negative for bleeding problem.   ? ?EKGs/Labs/Other Test Reviewed:   ? ?EKG:  EKG is  ordered today.  The ekg ordered today demonstrates NSR, HR 60, no ST-T wave changes, QTc 408 ? ?Recent Labs: ?04/04/2021: Hemoglobin 16.0; Platelets 162 ?06/16/2021: ALT 44; BUN 13; Creatinine, Ser 0.95; Potassium 4.5; Sodium 140  ? ?Recent Lipid Panel ?Recent Labs  ?  04/02/21 ?0134 06/16/21 ?0859  ?CHOL 213* 104  ?TRIG 278* 117  ?HDL 27* 30*  ?VLDL 56*  --   ?LDLCALC 130* 53  ?  ? ?Risk Assessment/Calculations:   ?  ?    ?Physical Exam:   ? ?VS:  BP 130/70 (BP Location: Right Arm, Patient Position: Sitting, Cuff Size: Large)   Pulse 60   Ht 5\' 11"  (1.803 m)   Wt 225 lb 6.4 oz (102.2 kg)   SpO2 98%   BMI 31.44 kg/m?    ? ?Wt Readings from Last 3 Encounters:  ?07/28/21 225 lb 6.4 oz (102.2 kg)  ?06/25/21 222 lb 9.6 oz (101 kg)  ?06/22/21 220 lb 14.4 oz (100.2 kg)  ?  ?Constitutional:   ?   Appearance: Healthy appearance. Not in distress.  ?Neck:  ?   Vascular: No carotid bruit. JVD normal.  ?Pulmonary:  ?   Effort: Pulmonary effort is normal.  ?   Breath sounds: No wheezing. No rales.  ?Cardiovascular:  ?   Normal rate. Regular rhythm. Normal S1. Normal S2.   ?   Murmurs: There is no murmur.  ?Edema: ?   Peripheral edema absent.  ?Abdominal:  ?   Palpations: Abdomen is soft.  ?Skin: ?   General: Skin is warm and dry.  ?Neurological:  ?   General: No focal deficit present.  ?   Mental Status: Alert and oriented to person, place and time.  ?   Cranial Nerves: Cranial nerves are intact.  ?  ?    ?ASSESSMENT & PLAN:   ?Coronary artery disease involving native coronary artery of native heart without angina pectoris ?S/p inferolateral STEMI treated with DES to the OM1 and staged intervention  with DES to the RCA.    He is overall doing well without anginal symptoms.  He continues in cardiac rehab at [facility].  Continue dual antiplatelet therapy with aspirin 81 mg daily, ticagrelor 90 mg twice daily.  As he did have multiple stents, consider  changing ticagrelor to clopidogrel 1 year post MI.  Continue atorvastatin 80 mg daily, carvedilol 6.25 mg twice daily.  His EF is preserved.  Given his drug eruption post discharge, we will continue to avoid rechallenge with ARB.  Follow-up with Dr. Excell Seltzer in November (1 year post MI). ? ?Primary hypertension ?Blood pressures at cardiac rehab have been optimal.  Continue amlodipine 10 mg daily, carvedilol 6.25 mg twice daily. ? ?Hyperlipidemia LDL goal <70 ?LDL optimal.  Continue atorvastatin 80 mg daily. ? ?Tobacco abuse ?He continues to try to quit.  We again discussed the importance of this. ?     ?   ?Dispo:  Return in about 8 months (around 03/30/2022) for Routine follow up in 8 months with Dr.Cooper. .  ? ?Medication Adjustments/Labs and Tests Ordered: ?Current medicines are reviewed at length with the patient today.  Concerns regarding medicines are outlined above.  ?Tests Ordered: ?No orders of the defined types were placed in this encounter. ? ?Medication Changes: ?Meds ordered this encounter  ?Medications  ? amLODipine (NORVASC) 10 MG tablet  ?  Sig: Take 1 tablet (10 mg total) by mouth daily.  ?  Dispense:  90 tablet  ?  Refill:  3  ? atorvastatin (LIPITOR) 80 MG tablet  ?  Sig: Take 1 tablet (80 mg total) by mouth every evening.  ?  Dispense:  90 tablet  ?  Refill:  3  ? carvedilol (COREG) 6.25 MG tablet  ?  Sig: Take 1 tablet (6.25 mg total) by mouth 2 (two) times daily.  ?  Dispense:  180 tablet  ?  Refill:  3  ? ticagrelor (BRILINTA) 90 MG TABS tablet  ?  Sig: Take 1 tablet (90 mg total) by mouth 2 (two) times daily.  ?  Dispense:  180 tablet  ?  Refill:  3  ? ?Signed, ?Tereso Newcomer, PA-C  ?07/28/2021 10:23 AM    ?[facility] ?[address] [place], [place], [place]  [address] ?Phone: [phone]; Fax: [phone]  ?

## 2021-07-27 NOTE — Progress Notes (Signed)
Daily Session Note ? ?Patient Details  ?Name: Corey Robinson ?MRN: [id number] ?Date of Birth: [birth year] ?Referring Provider:   ?[name] from 05/14/2021 in [place]  ?Referring Provider Dr. Burt Knack  ? ?  ? ? ?Encounter Date: 07/27/2021 ? ?Check In: ? Session Check In - 07/27/21 0930   ? ?  ? Check-In  ? Supervising physician immediately available to respond to emergencies [facility] MD immediately available   ? Physician(s) Dr Johnsie Cancel   ? Location [facility]   ? Staff Present Jilda Roche, RN, Bjorn Loser, MS, ACSM-CEP, Exercise Physiologist;Heather Zigmund Sirenia Whitis, Exercise Physiologist   ? Virtual Visit No   ? Medication changes reported     No   ? Fall or balance concerns reported    Yes   ? Comments Patient states he loses his balance frequently.   ? Tobacco Cessation No Change   ? Warm-up and Cool-down Performed as group-led instruction   ? Resistance Training Performed Yes   ? VAD Patient? No   ?  ? Pain Assessment  ? Currently in Pain? No/denies   ? Pain Score 0-No pain   ? ?  ?  ? ?  ? ? ?Capillary Blood Glucose: ?No results found for this or any previous visit (from the past 24 hour(s)). ? ? ? ?Social History  ? ?Tobacco Use  ?Smoking Status Former  ? Packs/day: 1.50  ? Years: 25.00  ? Pack years: 37.50  ? Types: Cigarettes  ? Quit date: 05/07/2021  ? Years since quitting: 0.2  ?Smokeless Tobacco Current  ? ? ?Goals Met:  ?Independence with exercise equipment ?Exercise tolerated well ?No report of concerns or symptoms today ? ?Goals Unmet:  ?Not Applicable ? ?Comments: checkout 1030 ? ? ? ?Dr. Carlyle Dolly is Medical Director for [place] ?

## 2021-07-28 ENCOUNTER — Encounter: Payer: Self-pay | Admitting: Physician Assistant

## 2021-07-28 ENCOUNTER — Ambulatory Visit (INDEPENDENT_AMBULATORY_CARE_PROVIDER_SITE_OTHER): Payer: Worker's Compensation | Admitting: Physician Assistant

## 2021-07-28 ENCOUNTER — Other Ambulatory Visit: Payer: Self-pay

## 2021-07-28 VITALS — BP 130/70 | HR 60 | Ht 71.0 in | Wt 225.4 lb

## 2021-07-28 DIAGNOSIS — Z720 Tobacco use: Secondary | ICD-10-CM | POA: Diagnosis not present

## 2021-07-28 DIAGNOSIS — I2510 Atherosclerotic heart disease of native coronary artery without angina pectoris: Secondary | ICD-10-CM | POA: Diagnosis not present

## 2021-07-28 DIAGNOSIS — E785 Hyperlipidemia, unspecified: Secondary | ICD-10-CM

## 2021-07-28 DIAGNOSIS — I10 Essential (primary) hypertension: Secondary | ICD-10-CM

## 2021-07-28 MED ORDER — TICAGRELOR 90 MG PO TABS: 90.0000 mg | ORAL_TABLET | Freq: Two times a day (BID) | ORAL | 3 refills | Status: DC

## 2021-07-28 MED ORDER — AMLODIPINE BESYLATE 10 MG PO TABS: 10.0000 mg | ORAL_TABLET | Freq: Every day | ORAL | 3 refills | Status: DC

## 2021-07-28 MED ORDER — CARVEDILOL 6.25 MG PO TABS: 6.2500 mg | ORAL_TABLET | Freq: Two times a day (BID) | ORAL | 3 refills | Status: DC

## 2021-07-28 MED ORDER — ATORVASTATIN CALCIUM 80 MG PO TABS: 80.0000 mg | ORAL_TABLET | Freq: Every evening | ORAL | 3 refills | Status: DC

## 2021-07-28 NOTE — Assessment & Plan Note (Signed)
LDL optimal.  Continue atorvastatin 80 mg daily. 

## 2021-07-28 NOTE — Assessment & Plan Note (Signed)
Blood pressures at cardiac rehab have been optimal.  Continue amlodipine 10 mg daily, carvedilol 6.25 mg twice daily. ?

## 2021-07-28 NOTE — Assessment & Plan Note (Signed)
S/p inferolateral STEMI treated with DES to the OM1 and staged intervention with DES to the RCA.    He is overall doing well without anginal symptoms.  He continues in cardiac rehab at [facility].  Continue dual antiplatelet therapy with aspirin 81 mg daily, ticagrelor 90 mg twice daily.  As he did have multiple stents, consider changing ticagrelor to clopidogrel 1 year post MI.  Continue atorvastatin 80 mg daily, carvedilol 6.25 mg twice daily.  His EF is preserved.  Given his drug eruption post discharge, we will continue to avoid rechallenge with ARB.  Follow-up with Dr. Excell Seltzer in November (1 year post MI). ?

## 2021-07-28 NOTE — Patient Instructions (Signed)
Medication Instructions:  ? ?Your physician recommends that you continue on your current medications as directed. Please refer to the Current Medication list given to you today. ? ? ?*If you need a refill on your cardiac medications before your next appointment, please call your pharmacy* ? ? ?Follow-Up: ?At [facility], you and your health needs are our priority.  As part of our continuing mission to provide you with exceptional heart care, we have created designated Provider Care Teams.  These Care Teams include your primary Cardiologist (physician) and Advanced Practice Providers (APPs -  Physician Assistants and Nurse Practitioners) who all work together to provide you with the care you need, when you need it. ? ?We recommend signing up for the patient portal called "MyChart".  Sign up information is provided on this After Visit Summary.  MyChart is used to connect with patients for Virtual Visits (Telemedicine).  Patients are able to view lab/test results, encounter notes, upcoming appointments, etc.  Non-urgent messages can be sent to your provider as well.   ?To learn more about what you can do with MyChart, go to [email or web address].   ? ?Your next appointment:   ?8 month(s) ? ?The format for your next appointment:   ?In Person ? ?Provider:   ?Tonny Bollman, MD   ? ? ?Other Instructions ? ?Your physician wants you to follow-up in: 8 months with Dr.Cooper.  You will receive a reminder letter in the mail two months in advance. If you don't receive a letter, please call our office to schedule the follow-up appointment. ?  ?

## 2021-07-28 NOTE — Assessment & Plan Note (Signed)
He continues to try to quit.  We again discussed the importance of this. ?

## 2021-07-29 ENCOUNTER — Encounter (HOSPITAL_COMMUNITY)
Admission: RE | Admit: 2021-07-29 | Discharge: 2021-07-29 | Disposition: A | Discharge status: Home / Self Care | Payer: BC Managed Care – PPO | Source: Ambulatory Visit | Attending: Cardiovascular Disease | Admitting: Cardiovascular Disease

## 2021-07-29 DIAGNOSIS — I213 ST elevation (STEMI) myocardial infarction of unspecified site: Secondary | ICD-10-CM

## 2021-07-29 DIAGNOSIS — Z955 Presence of coronary angioplasty implant and graft: Secondary | ICD-10-CM | POA: Diagnosis not present

## 2021-07-29 NOTE — Progress Notes (Signed)
Daily Session Note ? ?Patient Details  ?Name: Corey Robinson ?MRN: [id number] ?Date of Birth: 1976-01-11 ?Referring Provider:   ?[name] from 05/14/2021 in [place]  ?Referring Provider Dr. Burt Knack  ? ?  ? ? ?Encounter Date: 07/29/2021 ? ?Check In: ? Session Check In - 07/29/21 0930   ? ?  ? Check-In  ? Supervising physician immediately available to respond to emergencies [facility] MD immediately available   ? Physician(s) Dr. Domenic Polite   ? Location [facility]   ? Staff Present Geanie Cooley, RN;Dalton Kris Mouton, MS, ACSM-CEP, Exercise Physiologist;Heather Zigmund Daniel, Exercise Physiologist   ? Virtual Visit No   ? Medication changes reported     No   ? Fall or balance concerns reported    Yes   ? Comments Patient states he loses his balance frequently.   ? Tobacco Cessation No Change   ? Warm-up and Cool-down Performed as group-led instruction   ? Resistance Training Performed Yes   ? VAD Patient? No   ? PAD/SET Patient? No   ?  ? Pain Assessment  ? Currently in Pain? No/denies   ? Pain Score 0-No pain   ? Multiple Pain Sites No   ? ?  ?  ? ?  ? ? ?Capillary Blood Glucose: ?No results found for this or any previous visit (from the past 24 hour(s)). ? ? ? ?Social History  ? ?Tobacco Use  ?Smoking Status Every Day  ? Packs/day: 0.50  ? Years: 25.00  ? Pack years: 12.50  ? Types: Cigarettes  ?Smokeless Tobacco Current  ? ? ?Goals Met:  ?Independence with exercise equipment ?Exercise tolerated well ?No report of concerns or symptoms today ?Strength training completed today ? ?Goals Unmet:  ?Not Applicable ? ?Comments: check out @ 10:30am ? ? ?Dr. Carlyle Dolly is Medical Director for [place] ?

## 2021-07-29 NOTE — Addendum Note (Signed)
Addended by: Tamsen Snider on: 07/29/2021 07:27 AM ? ? Modules accepted: Orders ? ?

## 2021-07-31 ENCOUNTER — Encounter (HOSPITAL_COMMUNITY)
Admission: RE | Admit: 2021-07-31 | Discharge: 2021-07-31 | Disposition: A | Discharge status: Home / Self Care | Payer: BC Managed Care – PPO | Source: Ambulatory Visit | Attending: Cardiovascular Disease | Admitting: Cardiovascular Disease

## 2021-07-31 DIAGNOSIS — I213 ST elevation (STEMI) myocardial infarction of unspecified site: Secondary | ICD-10-CM | POA: Diagnosis not present

## 2021-07-31 DIAGNOSIS — Z955 Presence of coronary angioplasty implant and graft: Secondary | ICD-10-CM | POA: Diagnosis not present

## 2021-07-31 NOTE — Progress Notes (Signed)
Daily Session Note ? ?Patient Details  ?Name: Corey Robinson ?MRN: [id number] ?Date of Birth: 04/19/1976 ?Referring Provider:   ?[name] from 05/14/2021 in [place]  ?Referring Provider Dr. Burt Knack  ? ?  ? ? ?Encounter Date: 07/31/2021 ? ?Check In: ? Session Check In - 07/31/21 0930   ? ?  ? Check-In  ? Supervising physician immediately available to respond to emergencies [facility] MD immediately available   ? Physician(s) Dr. Domenic Polite   ? Location [facility]   ? Staff Present Redge Gainer, BS, Exercise Physiologist;Dalton Kris Mouton, MS, ACSM-CEP, Exercise Physiologist;Debra Wynetta Emery, RN, BSN   ? Virtual Visit No   ? Medication changes reported     No   ? Fall or balance concerns reported    Yes   ? Comments Patient states he loses his balance frequently.   ? Tobacco Cessation No Change   ? Warm-up and Cool-down Performed as group-led instruction   ? Resistance Training Performed Yes   ? VAD Patient? No   ? PAD/SET Patient? No   ?  ? Pain Assessment  ? Currently in Pain? No/denies   ? Pain Score 0-No pain   ? Multiple Pain Sites No   ? ?  ?  ? ?  ? ? ?Capillary Blood Glucose: ?No results found for this or any previous visit (from the past 24 hour(s)). ? ? ? ?Social History  ? ?Tobacco Use  ?Smoking Status Every Day  ? Packs/day: 0.50  ? Years: 25.00  ? Pack years: 12.50  ? Types: Cigarettes  ?Smokeless Tobacco Current  ? ? ?Goals Met:  ?Independence with exercise equipment ?Exercise tolerated well ?No report of concerns or symptoms today ?Strength training completed today ? ?Goals Unmet:  ?Not Applicable ? ?Comments: checkout time is 1030 ? ? ?Dr. Carlyle Dolly is Medical Director for [place] ?

## 2021-08-03 ENCOUNTER — Encounter (HOSPITAL_COMMUNITY)
Admission: RE | Admit: 2021-08-03 | Discharge: 2021-08-03 | Disposition: A | Discharge status: Home / Self Care | Payer: BC Managed Care – PPO | Source: Ambulatory Visit | Attending: Cardiovascular Disease | Admitting: Cardiovascular Disease

## 2021-08-03 VITALS — Wt 223.8 lb

## 2021-08-03 DIAGNOSIS — I213 ST elevation (STEMI) myocardial infarction of unspecified site: Secondary | ICD-10-CM

## 2021-08-03 DIAGNOSIS — Z955 Presence of coronary angioplasty implant and graft: Secondary | ICD-10-CM | POA: Diagnosis not present

## 2021-08-03 NOTE — Progress Notes (Signed)
Daily Session Note ? ?Patient Details  ?Name: Corey Robinson ?MRN: [id number] ?Date of Birth: Aug 22, 1975 ?Referring Provider:   ?[name] from 05/14/2021 in [place]  ?Referring Provider Dr. Burt Knack  ? ?  ? ? ?Encounter Date: 08/03/2021 ? ?Check In: ? Session Check In - 08/03/21 0930   ? ?  ? Check-In  ? Supervising physician immediately available to respond to emergencies [facility] MD immediately available   ? Physician(s) Johney Frame   ? Location [facility]   ? Staff Present Geanie Cooley, RN;Ludell Zacarias Wynetta Emery, RN, Bjorn Loser, MS, ACSM-CEP, Exercise Physiologist   Madelyn Flavors, RN  ? Virtual Visit No   ? Medication changes reported     No   ? Fall or balance concerns reported    Yes   ? Comments Patient states he loses his balance frequently.   ? Tobacco Cessation No Change   ? Warm-up and Cool-down Performed as group-led instruction   ? Resistance Training Performed Yes   ? VAD Patient? No   ? PAD/SET Patient? No   ?  ? Pain Assessment  ? Currently in Pain? No/denies   ? Pain Score 0-No pain   ? Multiple Pain Sites No   ? ?  ?  ? ?  ? ? ?Capillary Blood Glucose: ?No results found for this or any previous visit (from the past 24 hour(s)). ? ? ? ?Social History  ? ?Tobacco Use  ?Smoking Status Every Day  ? Packs/day: 0.50  ? Years: 25.00  ? Pack years: 12.50  ? Types: Cigarettes  ?Smokeless Tobacco Current  ? ? ?Goals Met:  ?Independence with exercise equipment ?Exercise tolerated well ?No report of concerns or symptoms today ?Strength training completed today ? ?Goals Unmet:  ?Not Applicable ? ?Comments: Check out 1030. ? ? ?Dr. Carlyle Dolly is Medical Director for [place] ?

## 2021-08-05 ENCOUNTER — Encounter (HOSPITAL_COMMUNITY)
Admission: RE | Admit: 2021-08-05 | Discharge: 2021-08-05 | Disposition: A | Discharge status: Home / Self Care | Payer: BC Managed Care – PPO | Source: Ambulatory Visit | Attending: Cardiovascular Disease | Admitting: Cardiovascular Disease

## 2021-08-05 DIAGNOSIS — I213 ST elevation (STEMI) myocardial infarction of unspecified site: Secondary | ICD-10-CM | POA: Diagnosis not present

## 2021-08-05 DIAGNOSIS — Z955 Presence of coronary angioplasty implant and graft: Secondary | ICD-10-CM | POA: Diagnosis not present

## 2021-08-07 ENCOUNTER — Other Ambulatory Visit: Payer: Self-pay | Admitting: Physician Assistant

## 2021-08-07 ENCOUNTER — Encounter (HOSPITAL_COMMUNITY)
Admission: RE | Admit: 2021-08-07 | Discharge: 2021-08-07 | Disposition: A | Discharge status: Home / Self Care | Payer: BC Managed Care – PPO | Source: Ambulatory Visit | Attending: Cardiovascular Disease | Admitting: Cardiovascular Disease

## 2021-08-07 DIAGNOSIS — I213 ST elevation (STEMI) myocardial infarction of unspecified site: Secondary | ICD-10-CM

## 2021-08-07 DIAGNOSIS — Z955 Presence of coronary angioplasty implant and graft: Secondary | ICD-10-CM

## 2021-08-07 NOTE — Progress Notes (Signed)
Daily Session Note ? ?Patient Details  ?Name: Corey Robinson ?MRN: [id number] ?Date of Birth: Nov 16, 1975 ?Referring Provider:   ?[name] from 05/14/2021 in [place]  ?Referring Provider Dr. Burt Knack  ? ?  ? ? ?Encounter Date: 08/07/2021 ? ?Check In: ? Session Check In - 08/07/21 0930   ? ?  ? Check-In  ? Supervising physician immediately available to respond to emergencies [facility] MD immediately available   ? Physician(s) Dr Harl Bowie   ? Location [facility]   ? Staff Present Geanie Cooley, RN;Daphyne Hassell Done, RN, Madlyn Frankel, RN, Jennye Moccasin, RN, BSN   ? Virtual Visit No   ? Medication changes reported     No   ? Fall or balance concerns reported    Yes   ? Comments Patient states he loses his balance frequently.   ? Tobacco Cessation No Change   ? Warm-up and Cool-down Performed as group-led instruction   ? Resistance Training Performed Yes   ? VAD Patient? No   ? PAD/SET Patient? No   ?  ? Pain Assessment  ? Currently in Pain? No/denies   ? Pain Score 0-No pain   ? ?  ?  ? ?  ? ? ?Capillary Blood Glucose: ?No results found for this or any previous visit (from the past 24 hour(s)). ? ? ? ?Social History  ? ?Tobacco Use  ?Smoking Status Every Day  ? Packs/day: 0.50  ? Years: 25.00  ? Pack years: 12.50  ? Types: Cigarettes  ?Smokeless Tobacco Current  ? ? ?Goals Met:  ?Independence with exercise equipment ?Exercise tolerated well ?No report of concerns or symptoms today ?Strength training completed today ? ?Goals Unmet:  ?Not Applicable ? ?Comments: checkout 1030 ? ? ? ?Dr. Carlyle Dolly is Medical Director for [place] ?

## 2021-08-10 ENCOUNTER — Encounter (HOSPITAL_COMMUNITY)
Admission: RE | Admit: 2021-08-10 | Discharge: 2021-08-10 | Disposition: A | Discharge status: Home / Self Care | Payer: BC Managed Care – PPO | Source: Ambulatory Visit | Attending: Cardiovascular Disease | Admitting: Cardiovascular Disease

## 2021-08-10 VITALS — Ht 71.0 in | Wt 228.0 lb

## 2021-08-10 DIAGNOSIS — Z955 Presence of coronary angioplasty implant and graft: Secondary | ICD-10-CM | POA: Diagnosis not present

## 2021-08-10 DIAGNOSIS — I213 ST elevation (STEMI) myocardial infarction of unspecified site: Secondary | ICD-10-CM

## 2021-08-10 NOTE — Progress Notes (Signed)
Daily Session Note ? ?Patient Details  ?Name: Corey Robinson ?MRN: [id number] ?Date of Birth: 1975/09/01 ?Referring Provider:   ?[name] from 05/14/2021 in [place]  ?Referring Provider Dr. Burt Knack  ? ?  ? ? ?Encounter Date: 08/10/2021 ? ?Check In: ? Session Check In - 08/10/21 0930   ? ?  ? Check-In  ? Supervising physician immediately available to respond to emergencies [facility] MD immediately available   ? Physician(s) Dr Harl Bowie   ? Location [facility]   ? Staff Present Redge Gainer, BS, Exercise Physiologist;Debra Wynetta Emery, RN, Joanette Gula, RN, Madlyn Frankel, RN, BSN   ? Virtual Visit No   ? Medication changes reported     No   ? Fall or balance concerns reported    Yes   ? Comments Patient states he loses his balance frequently.   ? Tobacco Cessation No Change   ? Warm-up and Cool-down Performed as group-led instruction   ? Resistance Training Performed Yes   ? VAD Patient? No   ? PAD/SET Patient? No   ?  ? Pain Assessment  ? Currently in Pain? No/denies   ? Pain Score 0-No pain   ? Multiple Pain Sites No   ? ?  ?  ? ?  ? ? ?Capillary Blood Glucose: ?No results found for this or any previous visit (from the past 24 hour(s)). ? ? ? ?Social History  ? ?Tobacco Use  ?Smoking Status Every Day  ? Packs/day: 0.50  ? Years: 25.00  ? Pack years: 12.50  ? Types: Cigarettes  ?Smokeless Tobacco Current  ? ? ?Goals Met:  ?Independence with exercise equipment ?Exercise tolerated well ?No report of concerns or symptoms today ?Strength training completed today ? ?Goals Unmet:  ?Not Applicable ? ?Comments: Check out at 1030. ? ? ?Dr. Carlyle Dolly is Medical Director for [place] ?

## 2021-08-11 NOTE — Progress Notes (Signed)
Discharge Progress Report ? ?Patient Details  ?Name: Corey Robinson ?MRN: [id number] ?Date of Birth: 08-25-1975 ?Referring Provider:   ?[name] from 05/14/2021 in [place]  ?Referring Provider Dr. Burt Knack  ? ?  ? ? ? ?Number of Visits: 35 ? ?Reason for Discharge:  ?Patient reached a stable level of exercise. ?Patient independent in their exercise. ?Patient has met program and personal goals. ? ?Smoking History:  ?Social History  ? ?Tobacco Use  ?Smoking Status Every Day  ? Packs/day: 0.50  ? Years: 25.00  ? Pack years: 12.50  ? Types: Cigarettes  ?Smokeless Tobacco Current  ? ? ?Diagnosis:  ?ST elevation myocardial infarction (STEMI), unspecified artery ([place]) ? ?ADL UCSD: ? ? ?Initial Exercise Prescription: ? Initial Exercise Prescription - 05/14/21 1000   ? ?  ? Date of Initial Exercise RX and Referring Provider  ? Date 05/14/21   ? Referring Provider Dr. Burt Knack   ? Expected Discharge Date 08/10/21   ?  ? Treadmill  ? MPH 1.4   ? Grade 0   ? Minutes 17   ?  ? Elliptical  ? Level 1   ? Speed 60   ? Minutes 22   ?  ? Prescription Details  ? Frequency (times per week) 3   ? Duration Progress to 30 minutes of continuous aerobic without signs/symptoms of physical distress   ?  ? Intensity  ? THRR 40-80% of Max Heartrate 70-141   ? Ratings of Perceived Exertion 11-13   ? Perceived Dyspnea 0-4   ?  ? Resistance Training  ? Training Prescription Yes   ? Weight 4   ? Reps 10-15   ? ?  ?  ? ?  ? ? ?Discharge Exercise Prescription (Final Exercise Prescription Changes): ? Exercise Prescription Changes - 08/03/21 1022   ? ?  ? Response to Exercise  ? Blood Pressure (Admit) 130/80   ? Blood Pressure (Exercise) 132/82   ? Blood Pressure (Exit) 130/80   ? Heart Rate (Admit) 81 bpm   ? Heart Rate (Exercise) 94 bpm   ? Heart Rate (Exit) 80 bpm   ? Rating of Perceived Exertion (Exercise) 12   ? Duration Continue with 30 min of aerobic exercise without signs/symptoms of  physical distress.   ? Intensity THRR unchanged   ?  ? Progression  ? Progression Continue to progress workloads to maintain intensity without signs/symptoms of physical distress.   ?  ? Resistance Training  ? Training Prescription Yes   ? Weight 10   ? Reps 10-15   ? Time 10 Minutes   ?  ? Treadmill  ? MPH 2.7   ? Grade 1   ? Minutes 17   ? METs 3.44   ?  ? Elliptical  ? Level 5   ? Speed 73   ? Minutes 22   ? METs 4.2   ? ?  ?  ? ?  ? ? ?Functional Capacity: ? 6 Minute Walk   ? ? [place] Name 05/14/21 1008 08/10/21 0956  ?  ?  ? 6 Minute Walk  ? Phase Initial Discharge   ? Distance 1150 feet 1600 feet   ? Distance Feet Change -- 450 ft   ? Walk Time 6 minutes 6 minutes   ? # of Rest Breaks 1 0   ? MPH 2.2 3.03   ? METS 3.6 4.5   ? RPE 11 11   ? VO2  Peak 12.61 15.77   ? Symptoms No No   ? Resting HR 66 bpm 77 bpm   ? Resting BP 118/60 126/68   ? Resting Oxygen Saturation  98 % 99 %   ? Exercise Oxygen Saturation  during 6 min walk 98 % 98 %   ? Max Ex. HR 72 bpm 82 bpm   ? Max Ex. BP 120/60 140/70   ? 2 Minute Post BP 115/60 128/66   ? ?  ?  ? ?  ? ? ?Psychological, QOL, Others - Outcomes: ?PHQ 2/9: ?Depression screen [facility] 2/9 08/11/2021 06/25/2021 06/25/2021 05/14/2021 05/05/2021  ?Decreased Interest 0 0 0 1 0  ?Down, Depressed, Hopeless 0 0 0 0 0  ?PHQ - 2 Score 0 0 0 1 0  ?Altered sleeping 1 1 - 2 1  ?Tired, decreased energy 0 1 - 1 1  ?Change in appetite 0 0 - 1 0  ?Feeling bad or failure about yourself  0 0 - 1 0  ?Trouble concentrating 0 0 - 0 0  ?Moving slowly or fidgety/restless 0 0 - 0 0  ?Suicidal thoughts 0 0 - 0 0  ?PHQ-9 Score 1 2 - 6 2  ?Difficult doing work/chores Not difficult at all Not difficult at all - Somewhat difficult Not difficult at all  ? ? ?Quality of Life: ? Quality of Life - 08/10/21 0958   ? ?  ? Quality of Life Scores  ? Health/Function Pre 15.47 %   ? Health/Function Post 23.68 %   ? Health/Function % Change 53.07 %   ? Socioeconomic Pre 19.5 %   ? Socioeconomic Post 22.88 %   ? Socioeconomic  % Change  17.33 %   ? Psych/Spiritual Pre 20.36 %   ? Psych/Spiritual Post 22.93 %   ? Psych/Spiritual % Change 12.62 %   ? Family Pre 22.4 %   ? Family Post 24 %   ? Family % Change 7.14 %   ? GLOBAL Pre 18.32 %   ? GLOBAL Post 23.38 %   ? GLOBAL % Change 27.62 %   ? ?  ?  ? ?  ? ? ?Personal Goals: ?Goals established at orientation with interventions provided to work toward goal. ? Personal Goals and Risk Factors at Admission - 05/14/21 0936   ? ?  ? Core Components/Risk Factors/Patient Goals on Admission  ?  Weight Management Obesity   ? Lipids Yes   ? Intervention Provide education and support for participant on nutrition & aerobic/resistive exercise along with prescribed medications to achieve LDL <14m, HDL >412m   ? Expected Outcomes Short Term: Participant states understanding of desired cholesterol values and is compliant with medications prescribed. Participant is following exercise prescription and nutrition guidelines.;Long Term: Cholesterol controlled with medications as prescribed, with individualized exercise RX and with personalized nutrition plan. Value goals: LDL < 7067mHDL > 40 mg.   ? Personal Goal Other Yes   ? Personal Goal Patient wants to get back to normal; get back to work; and be able to manage his anxiety.   ? Intervention Patient will attend CR 3 days/week with exercise and education and supplement with exercise at home.   ? Expected Outcomes Patient will complete the program meeting both program and personal goals.   ? ?  ?  ? ?  ?  ? ?Personal Goals Discharge: ? Goals and Risk Factor Review   ? ? [place] 05/15/21 [phone]/23/23 1332 07/15/21 0803 08/11/21 1523  ?  ?  ?  Core Components/Risk Factors/Patient Goals Review  ? Personal Goals Review Weight Management/Obesity;Lipids;Other Weight Management/Obesity;Lipids;Other Weight Management/Obesity;Lipids;Other Weight Management/Obesity;Lipids;Other   ? Review Patient referred to CR with STEMI. He has multiple risk factors for CAD and is  participating in the program for risk modification. He plans to start the program 05/20/21. His personal goals for the program are to return to his normal life; be able to return to work and get stronger. We will monitor his progress as he works towards meeting these goals. Patient has completed 10 sessions with his current weight at 218.0 lbs down from his initial weight of 220.5 lbs. He is doing well in the program with progressions and consistent attendance. His blood pressure is at and below goal. He works hard during sessions. His personal goals continue to be to be able to return to normal activities and work and be able to manage his anxiety better. We will continue to monitor as he works towards meeting these goals. Patient has completed 20 sessions with his current weight at 223.0 up 5 lbs since last 30 day review. He continues to do very well in the program with progressions and consistent attendance. His blood pressure continues to be well controlled. He works hard during sessions. His personal goals continue to be to be able to return to normal activities and work and be able to manage his anxiety better. He states he feels better than he has felt in years. 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 He is glad to be able to return to his job. He feels he is ready. We will continue to monitor as he works towards meeting these goals. Pt graduated from CR after 32 sessions. He gained 4.5 kg while in the program. He had good attendance and worked hard while he was here. His BP and HR was WNLs while he was in the program. He reported to his PCP in February that his anxiety is much more under control than when he began the program. He has been cleared to resume all of his normal activities at work. He now exercise several days per week outside of rehab and will continue this on now that he has graduated.   ? Expected Outcomes Patient will complete the  program meeting both personal and program goals. Patient will complete the program meeting both personal and program goals. Patient will complete the program meeting both personal and program goals. Pt wi

## 2021-09-15 LAB — HM DIABETES EYE EXAM

## 2021-09-17 DIAGNOSIS — M79671 Pain in right foot: Secondary | ICD-10-CM | POA: Diagnosis not present

## 2021-09-17 DIAGNOSIS — B079 Viral wart, unspecified: Secondary | ICD-10-CM | POA: Diagnosis not present

## 2021-09-17 DIAGNOSIS — M89371 Hypertrophy of bone, right ankle and foot: Secondary | ICD-10-CM | POA: Diagnosis not present

## 2021-09-22 ENCOUNTER — Other Ambulatory Visit: Payer: Self-pay | Admitting: Family Medicine

## 2021-09-23 ENCOUNTER — Ambulatory Visit: Payer: BC Managed Care – PPO | Admitting: Family Medicine

## 2021-09-23 ENCOUNTER — Encounter: Payer: Self-pay | Admitting: Family Medicine

## 2021-09-23 VITALS — Temp 98.1°F | Ht 71.0 in | Wt 236.0 lb

## 2021-09-23 DIAGNOSIS — I10 Essential (primary) hypertension: Secondary | ICD-10-CM | POA: Diagnosis not present

## 2021-09-23 DIAGNOSIS — E785 Hyperlipidemia, unspecified: Secondary | ICD-10-CM | POA: Diagnosis not present

## 2021-09-23 DIAGNOSIS — R7303 Prediabetes: Secondary | ICD-10-CM | POA: Diagnosis not present

## 2021-09-23 LAB — BAYER DCA HB A1C WAIVED: HB A1C (BAYER DCA - WAIVED): 6.3 % — ABNORMAL HIGH (ref 4.8–5.6)

## 2021-09-23 MED ORDER — ESCITALOPRAM OXALATE 10 MG PO TABS: 10.0000 mg | ORAL_TABLET | Freq: Every day | ORAL | 3 refills | Status: DC

## 2021-09-23 MED ORDER — VARENICLINE TARTRATE 1 MG PO TABS: 1.0000 mg | ORAL_TABLET | Freq: Two times a day (BID) | ORAL | 5 refills | Status: DC

## 2021-09-23 MED ORDER — SITAGLIPTIN PHOSPHATE 100 MG PO TABS: 100.0000 mg | ORAL_TABLET | Freq: Every day | ORAL | 2 refills | Status: DC

## 2021-09-23 MED ORDER — CHANTIX STARTING MONTH PAK 0.5 MG X 11 & 1 MG X 42 PO TBPK: ORAL_TABLET | ORAL | 0 refills | Status: DC

## 2021-09-23 NOTE — Progress Notes (Signed)
? ?Subjective:  ?Patient ID: Corey Robinson, male    DOB: 10-May-1976  Age: 46 y.o. MRN: [id number] ? ?CC: Medical Management of Chronic Issues ? ? ?HPI ?Corey Robinson presents for  presents for  follow-up of hypertension. Patient has no history of headache chest pain or shortness of breath or recent cough. Patient also denies symptoms of TIA such as focal numbness or weakness. Patient denies side effects from medication. States taking it regularly. ? ? in for follow-up of elevated cholesterol. Doing well without complaints on current medication. Denies side effects of statin including myalgia and arthralgia and nausea. Currently no chest pain, shortness of breath or other cardiovascular related symptoms noted. ? ?Pt. Also has hx of prediabetes. Working on carb controlled diet and exercise. ? ? ?  09/23/2021  ?  8:36 AM 06/25/2021  ? 10:55 AM 05/05/2021  ? 10:01 AM  ?GAD 7 : Generalized Anxiety Score  ?Nervous, Anxious, on Edge 1 1 2   ?Control/stop worrying 0 0 1  ?Worry too much - different things 0 0 1  ?Trouble relaxing 0 1 1  ?Restless 0 0 0  ?Easily annoyed or irritable 1 1 2   ?Afraid - awful might happen 0 1 2  ?Total GAD 7 Score 2 4 9   ?Anxiety Difficulty Not difficult at all Not difficult at all Somewhat difficult  ? ? ? ? ? ?  09/23/2021  ?  8:36 AM 09/23/2021  ?  8:27 AM 08/11/2021  ?  3:11 PM  ?Depression screen PHQ 2/9  ?Decreased Interest 0 0 0  ?Down, Depressed, Hopeless 0 0 0  ?PHQ - 2 Score 0 0 0  ?Altered sleeping 1  1  ?Tired, decreased energy 0  0  ?Change in appetite 0  0  ?Feeling bad or failure about yourself  0  0  ?Trouble concentrating 0  0  ?Moving slowly or fidgety/restless 0  0  ?Suicidal thoughts 0  0  ?PHQ-9 Score 1  1  ?Difficult doing work/chores Not difficult at all  Not difficult at all  ? ? ?History ?Corey Robinson has a past medical history of CAD (coronary artery disease) (04/03/2021), HLD (hyperlipidemia) (04/04/2021), HTN (hypertension), Polycythemia, and Pre-diabetes (01/04/2017).  ? ?He  has a past surgical history that includes LEFT HEART CATH AND CORONARY ANGIOGRAPHY (N/A, 04/01/2021); CORONARY STENT INTERVENTION (N/A, 04/01/2021); CORONARY STENT INTERVENTION (N/A, 04/03/2021); and CORONARY ANGIOGRAPHY (N/A, 04/03/2021).  ? ?His family history is not on file.He reports that he has been smoking cigarettes. He has a 12.50 pack-year smoking history. He uses smokeless tobacco. No history on file for alcohol use and drug use. ? ? ? ?ROS ?Review of Systems  ?Constitutional:  Negative for fever.  ?Respiratory:  Negative for shortness of breath.   ?Cardiovascular:  Negative for chest pain.  ?Musculoskeletal:  Negative for arthralgias.  ?Skin:  Negative for rash.  ? ?Objective:  ?Temp 98.1 ?F (36.7 ?C)   Ht 5' 11"  (1.803 m)   Wt 236 lb (107 kg)   BMI 32.92 kg/m?  ? ?BP Readings from Last 3 Encounters:  ?07/28/21 130/70  ?06/25/21 119/82  ?05/14/21 118/60  ? ? ?Wt Readings from Last 3 Encounters:  ?09/23/21 236 lb (107 kg)  ?08/10/21 227 lb 15.3 oz (103.4 kg)  ?08/03/21 223 lb 12.3 oz (101.5 kg)  ? ? ? ?Physical Exam ?Vitals reviewed.  ?Constitutional:   ?   Appearance: He is well-developed.  ?HENT:  ?   Head: Normocephalic and atraumatic.  ?  Right Ear: External ear normal.  ?   Left Ear: External ear normal.  ?   Mouth/Throat:  ?   Pharynx: No oropharyngeal exudate or posterior oropharyngeal erythema.  ?Eyes:  ?   Pupils: Pupils are equal, round, and reactive to light.  ?Cardiovascular:  ?   Rate and Rhythm: Normal rate and regular rhythm.  ?   Heart sounds: No murmur heard. ?Pulmonary:  ?   Effort: No respiratory distress.  ?   Breath sounds: Normal breath sounds.  ?Musculoskeletal:  ?   Cervical back: Normal range of motion and neck supple.  ?Neurological:  ?   Mental Status: He is alert and oriented to person, place, and time.  ? ? ? ? ?Assessment & Plan:  ? ?Corey Robinson was seen today for medical management of chronic issues. ? ?Diagnoses and all orders for this visit: ? ?Pre-diabetes ?-     Bayer DCA  Hb A1c Waived ? ?Hyperlipidemia LDL goal <70 ?-     Lipid panel ? ?Primary hypertension ?-     CBC with Differential/Platelet ?-     CMP14+EGFR ? ?Other orders ?-     escitalopram (LEXAPRO) 10 MG tablet; Take 1 tablet (10 mg total) by mouth daily. ?-     Varenicline Tartrate, Starter, (CHANTIX STARTING MONTH PAK) 0.5 MG X 11 & 1 MG X 42 TBPK; Use according to package directions ?-     varenicline (CHANTIX CONTINUING MONTH PAK) 1 MG tablet; Take 1 tablet (1 mg total) by mouth 2 (two) times daily. ?-     sitaGLIPtin (JANUVIA) 100 MG tablet; Take 1 tablet (100 mg total) by mouth daily. ? ? ? ? ? ? ?I am having Corey Robinson start on Chantix Starting [employer], varenicline, and sitaGLIPtin. I am also having him maintain his Omega-3 Fatty Acids (FISH OIL PO), aspirin EC, acetaminophen, nitroGLYCERIN, betamethasone dipropionate, diphenhydrAMINE, nicotine, omeprazole, amLODipine, atorvastatin, ticagrelor, carvedilol, and escitalopram. ? ?Allergies as of 09/23/2021   ? ?   Reactions  ? Losartan Rash  ? ???-Drug eruption noted post hosp>>Losartan, Metoprolol Tartrate, Atorvastatin all DCd w resolution  ? Metoprolol Tartrate Rash  ? ???-Drug eruption noted post hosp in 03/2021>>Losartan, Metoprolol Tartrate, Atorvastatin all DCd w resolution  ? ?  ? ?  ?Medication List  ?  ? ?  ? Accurate as of Sep 23, 2021  9:03 PM. If you have any questions, ask your nurse or doctor.  ?  ?  ? ?  ? ?acetaminophen 500 MG tablet ?Commonly known as: TYLENOL ?Take 1,000 mg by mouth every 6 (six) hours as needed for mild pain, headache or fever. ?  ?amLODipine 10 MG tablet ?Commonly known as: NORVASC ?Take 1 tablet (10 mg total) by mouth daily. ?  ?aspirin EC 81 MG tablet ?Take 81 mg by mouth daily. Swallow whole. ?  ?atorvastatin 80 MG tablet ?Commonly known as: LIPITOR ?Take 1 tablet (80 mg total) by mouth every evening. ?  ?betamethasone dipropionate 0.05 % cream ?2 times daily per day X 1 week. ?  ?carvedilol 6.25 MG tablet ?Commonly known  as: COREG ?Take 1 tablet (6.25 mg total) by mouth 2 (two) times daily. ?  ?Chantix Starting Month Pak 0.5 MG X 11 & 1 MG X 42 Tbpk ?Generic drug: Varenicline Tartrate (Starter) ?Use according to package directions ?Started by: Claretta Fraise, MD ?  ?varenicline 1 MG tablet ?Commonly known as: Chantix Continuing Month Pak ?Take 1 tablet (1 mg total) by mouth 2 (two) times daily. ?  Started by: Claretta Fraise, MD ?  ?diphenhydrAMINE 25 mg capsule ?Commonly known as: Benadryl Allergy ?Take 1 capsule (25 mg total) by mouth every 6 (six) hours as needed. ?  ?escitalopram 10 MG tablet ?Commonly known as: LEXAPRO ?Take 1 tablet (10 mg total) by mouth daily. ?  ?FISH OIL PO ?Take 1,200 mg by mouth every evening. ?  ?nicotine 14 mg/24hr patch ?Commonly known as: NICODERM CQ - dosed in mg/24 hours ?Place 14 mg onto the skin daily as needed (smoking cessation). ?  ?nitroGLYCERIN 0.4 MG SL tablet ?Commonly known as: NITROSTAT ?Place 1 tablet (0.4 mg total) under the tongue every 5 (five) minutes as needed for chest pain. ?  ?omeprazole 20 MG capsule ?Commonly known as: PRILOSEC ?Take 20 mg by mouth daily as needed (indigestion/heartburn.). ?  ?sitaGLIPtin 100 MG tablet ?Commonly known as: Januvia ?Take 1 tablet (100 mg total) by mouth daily. ?Started by: Claretta Fraise, MD ?  ?ticagrelor 90 MG Tabs tablet ?Commonly known as: BRILINTA ?Take 1 tablet (90 mg total) by mouth 2 (two) times daily. ?  ? ?  ? ? ? ?Follow-up: Return in about 6 months (around 03/26/2022). ? ?Claretta Fraise, M.D. ?

## 2021-09-24 LAB — CBC WITH DIFFERENTIAL/PLATELET
Basophils Absolute: 0.1 10*3/uL (ref 0.0–0.2)
Basos: 1 %
EOS (ABSOLUTE): 0.2 10*3/uL (ref 0.0–0.4)
Eos: 2 %
Hematocrit: 46.8 % (ref 37.5–51.0)
Hemoglobin: 16.3 g/dL (ref 13.0–17.7)
Immature Grans (Abs): 0 10*3/uL (ref 0.0–0.1)
Immature Granulocytes: 0 %
Lymphocytes Absolute: 2.8 10*3/uL (ref 0.7–3.1)
Lymphs: 27 %
MCH: 30.3 pg (ref 26.6–33.0)
MCHC: 34.8 g/dL (ref 31.5–35.7)
MCV: 87 fL (ref 79–97)
Monocytes Absolute: 0.9 10*3/uL (ref 0.1–0.9)
Monocytes: 9 %
Neutrophils Absolute: 6.4 10*3/uL (ref 1.4–7.0)
Neutrophils: 61 %
Platelets: 189 10*3/uL (ref 150–450)
RBC: 5.38 x10E6/uL (ref 4.14–5.80)
RDW: 12.8 % (ref 11.6–15.4)
WBC: 10.4 10*3/uL (ref 3.4–10.8)

## 2021-09-24 LAB — CMP14+EGFR
ALT: 39 IU/L (ref 0–44)
AST: 27 IU/L (ref 0–40)
Albumin/Globulin Ratio: 1.8 (ref 1.2–2.2)
Albumin: 4.6 g/dL (ref 4.0–5.0)
Alkaline Phosphatase: 120 IU/L (ref 44–121)
BUN/Creatinine Ratio: 15 (ref 9–20)
BUN: 16 mg/dL (ref 6–24)
Bilirubin Total: 0.3 mg/dL (ref 0.0–1.2)
CO2: 18 mmol/L — ABNORMAL LOW (ref 20–29)
Calcium: 9.3 mg/dL (ref 8.7–10.2)
Chloride: 103 mmol/L (ref 96–106)
Creatinine, Ser: 1.09 mg/dL (ref 0.76–1.27)
Globulin, Total: 2.5 g/dL (ref 1.5–4.5)
Glucose: 143 mg/dL — ABNORMAL HIGH (ref 70–99)
Potassium: 4.5 mmol/L (ref 3.5–5.2)
Sodium: 139 mmol/L (ref 134–144)
Total Protein: 7.1 g/dL (ref 6.0–8.5)
eGFR: 86 mL/min/{1.73_m2} (ref >59)

## 2021-09-24 LAB — LIPID PANEL
Chol/HDL Ratio: 7.5 ratio — ABNORMAL HIGH (ref 0.0–5.0)
Cholesterol, Total: 166 mg/dL (ref 100–199)
HDL: 22 mg/dL — ABNORMAL LOW (ref >39)
Triglycerides: 813 mg/dL (ref 0–149)

## 2021-10-01 DIAGNOSIS — B079 Viral wart, unspecified: Secondary | ICD-10-CM | POA: Diagnosis not present

## 2021-10-15 DIAGNOSIS — B079 Viral wart, unspecified: Secondary | ICD-10-CM | POA: Diagnosis not present

## 2021-11-29 ENCOUNTER — Other Ambulatory Visit: Payer: Self-pay | Admitting: Family Medicine

## 2022-02-05 ENCOUNTER — Telehealth: Payer: Self-pay

## 2022-02-05 NOTE — Patient Instructions (Signed)
Visit Information  Thank you for taking time to speak with me today. Please do not hesitate to call if you require assistance.  Following are the goals we discussed today:   Goals Addressed             This Visit's Progress    Care Coordination Activities       Care Coordination Interventions: Reviewed medications and treatment plans. Reports doing well. Denies concerns r/t medication management or prescription cost. Assessed social determinant of health barriers. Will follow up with PCP as scheduled in November.        Corey Robinson verbalized understanding of the information discussed during the telephonic outreach. Declined need for mailed instructions or resources.  No further follow up required: Mr. Laraia will call if additional outreach is needed.  [facility] Care Management [phone]

## 2022-02-05 NOTE — Patient Outreach (Signed)
  Care Coordination   Initial Visit Note   02/05/2022 Name: Corey Robinson MRN: [id number] DOB: 06-13-1975  Corey Robinson is a 46 y.o. year old male who sees Stacks, Broadus John, MD for primary care. I spoke with  Corey Robinson by phone today.  What matters to the patients health and wellness today?  No Concerns Expressed    Goals Addressed             This Visit's Progress    Care Coordination Activities       Care Coordination Interventions: Reviewed medications and treatment plans. Reports doing well. Denies concerns r/t medication management or prescription cost. Assessed social determinant of health barriers. Will follow up with PCP as scheduled in November.        SDOH assessments and interventions completed:  Yes  SDOH Interventions Today    Flowsheet Row Most Recent Value  SDOH Interventions   Food Insecurity Interventions Intervention Not Indicated  Transportation Interventions Intervention Not Indicated        Care Coordination Interventions Activated:  Yes  Care Coordination Interventions:  Yes, provided   Follow up plan: No further intervention required.   Encounter Outcome:  Pt. Visit Completed   [facility] Care Management [phone]

## 2022-03-29 ENCOUNTER — Ambulatory Visit: Payer: BC Managed Care – PPO | Admitting: Family Medicine

## 2022-03-29 ENCOUNTER — Encounter: Payer: Self-pay | Admitting: Family Medicine

## 2022-03-29 VITALS — BP 128/84 | HR 74 | Temp 97.7°F | Ht 71.0 in | Wt 250.6 lb

## 2022-03-29 DIAGNOSIS — I10 Essential (primary) hypertension: Secondary | ICD-10-CM

## 2022-03-29 DIAGNOSIS — E1159 Type 2 diabetes mellitus with other circulatory complications: Secondary | ICD-10-CM | POA: Diagnosis not present

## 2022-03-29 DIAGNOSIS — E785 Hyperlipidemia, unspecified: Secondary | ICD-10-CM

## 2022-03-29 DIAGNOSIS — R7303 Prediabetes: Secondary | ICD-10-CM | POA: Diagnosis not present

## 2022-03-29 LAB — BAYER DCA HB A1C WAIVED: HB A1C (BAYER DCA - WAIVED): 6.6 % — ABNORMAL HIGH (ref 4.8–5.6)

## 2022-03-29 MED ORDER — SITAGLIPTIN PHOSPHATE 100 MG PO TABS: 100.0000 mg | ORAL_TABLET | Freq: Every day | ORAL | 3 refills | Status: DC

## 2022-03-29 NOTE — Progress Notes (Signed)
Subjective:  Patient ID: Corey Robinson, male    DOB: 1976-01-10  Age: 46 y.o. MRN: [id number]  CC: Medical Management of Chronic Issues   HPI SHO SALGUERO presents for recheck of CAD & prediabetes. Feels lightheaded spells that are relieved by eating an oatmeal cookie.   presents for  follow-up of hypertension. Patient has no history of headache chest pain or shortness of breath or recent cough. Patient also denies symptoms of TIA such as focal numbness or weakness. Patient denies side effects from medication. States taking it regularly.  Down to 15 cigarettes a day.      03/29/2022    8:04 AM 03/29/2022    8:01 AM 09/23/2021    8:36 AM  Depression screen PHQ 2/9  Decreased Interest 0 0 0  Down, Depressed, Hopeless 0 0 0  PHQ - 2 Score 0 0 0  Altered sleeping 0  1  Tired, decreased energy 1  0  Change in appetite 0  0  Feeling bad or failure about yourself  0  0  Trouble concentrating 0  0  Moving slowly or fidgety/restless 0  0  Suicidal thoughts 0  0  PHQ-9 Score 1  1  Difficult doing work/chores Not difficult at all  Not difficult at all    History Enes has a past medical history of CAD (coronary artery disease) (04/03/2021), HLD (hyperlipidemia) (04/04/2021), HTN (hypertension), Polycythemia, and Pre-diabetes (01/04/2017).   He has a past surgical history that includes LEFT HEART CATH AND CORONARY ANGIOGRAPHY (N/A, 04/01/2021); CORONARY STENT INTERVENTION (N/A, 04/01/2021); CORONARY STENT INTERVENTION (N/A, 04/03/2021); and CORONARY ANGIOGRAPHY (N/A, 04/03/2021).   His family history is not on file.He reports that he has been smoking cigarettes. He has a 12.50 pack-year smoking history. He uses smokeless tobacco. No history on file for alcohol use and drug use.    ROS Review of Systems  Constitutional:  Negative for fever.  Respiratory:  Negative for shortness of breath.   Cardiovascular:  Negative for chest pain.  Musculoskeletal:  Negative for arthralgias.   Skin:  Negative for rash.    Objective:  BP 128/84   Pulse 74   Temp 97.7 F (36.5 C)   Ht 5' 11" (1.803 m)   Wt 250 lb 9.6 oz (113.7 kg)   SpO2 96%   BMI 34.95 kg/m   BP Readings from Last 3 Encounters:  03/29/22 128/84  07/28/21 130/70  06/25/21 119/82    Wt Readings from Last 3 Encounters:  03/29/22 250 lb 9.6 oz (113.7 kg)  09/23/21 236 lb (107 kg)  08/10/21 227 lb 15.3 oz (103.4 kg)     Physical Exam Vitals reviewed.  Constitutional:      Appearance: He is well-developed.  HENT:     Head: Normocephalic and atraumatic.     Right Ear: External ear normal.     Left Ear: External ear normal.     Mouth/Throat:     Pharynx: No oropharyngeal exudate or posterior oropharyngeal erythema.  Eyes:     Pupils: Pupils are equal, round, and reactive to light.  Cardiovascular:     Rate and Rhythm: Normal rate and regular rhythm.     Heart sounds: No murmur heard. Pulmonary:     Effort: No respiratory distress.     Breath sounds: Normal breath sounds.  Musculoskeletal:     Cervical back: Normal range of motion and neck supple.  Neurological:     Mental Status: He is alert and oriented to person,  place, and time.       Assessment & Plan:   Lukasz was seen today for medical management of chronic issues.  Diagnoses and all orders for this visit:  Type 2 diabetes mellitus with other circulatory complication, without long-term current use of insulin (HCC) -     Bayer DCA Hb A1c Waived -     Microalbumin / creatinine urine ratio  Hyperlipidemia LDL goal <70 -     Lipid panel  Primary hypertension -     CBC with Differential/Platelet -     CMP14+EGFR  Other orders -     sitaGLIPtin (JANUVIA) 100 MG tablet; Take 1 tablet (100 mg total) by mouth daily.       I have changed Leory Plowman T. Ramus's Januvia to sitaGLIPtin. I am also having him maintain his Omega-3 Fatty Acids (FISH OIL PO), aspirin EC, acetaminophen, nitroGLYCERIN, betamethasone dipropionate,  diphenhydrAMINE, nicotine, omeprazole, amLODipine, atorvastatin, ticagrelor, carvedilol, escitalopram, Chantix Starting Month Pak, and varenicline.  Allergies as of 03/29/2022       Reactions   Losartan Rash   ???-Drug eruption noted post hosp>>Losartan, Metoprolol Tartrate, Atorvastatin all DCd w resolution   Metoprolol Tartrate Rash   ???-Drug eruption noted post hosp in 03/2021>>Losartan, Metoprolol Tartrate, Atorvastatin all DCd w resolution        Medication List        Accurate as of March 29, 2022  8:38 AM. If you have any questions, ask your nurse or doctor.          acetaminophen 500 MG tablet Commonly known as: TYLENOL Take 1,000 mg by mouth every 6 (six) hours as needed for mild pain, headache or fever.   amLODipine 10 MG tablet Commonly known as: NORVASC Take 1 tablet (10 mg total) by mouth daily.   aspirin EC 81 MG tablet Take 81 mg by mouth daily. Swallow whole.   atorvastatin 80 MG tablet Commonly known as: LIPITOR Take 1 tablet (80 mg total) by mouth every evening.   betamethasone dipropionate 0.05 % cream 2 times daily per day X 1 week.   carvedilol 6.25 MG tablet Commonly known as: COREG Take 1 tablet (6.25 mg total) by mouth 2 (two) times daily.   Chantix Starting Month Pak 0.5 MG X 11 & 1 MG X 42 Tbpk Generic drug: Varenicline Tartrate (Starter) Use according to package directions   varenicline 1 MG tablet Commonly known as: Chantix Continuing Month Pak Take 1 tablet (1 mg total) by mouth 2 (two) times daily.   diphenhydrAMINE 25 mg capsule Commonly known as: Benadryl Allergy Take 1 capsule (25 mg total) by mouth every 6 (six) hours as needed.   escitalopram 10 MG tablet Commonly known as: LEXAPRO Take 1 tablet (10 mg total) by mouth daily.   FISH OIL PO Take 1,200 mg by mouth every evening.   nicotine 14 mg/24hr patch Commonly known as: NICODERM CQ - dosed in mg/24 hours Place 14 mg onto the skin daily as needed (smoking  cessation).   nitroGLYCERIN 0.4 MG SL tablet Commonly known as: NITROSTAT Place 1 tablet (0.4 mg total) under the tongue every 5 (five) minutes as needed for chest pain.   omeprazole 20 MG capsule Commonly known as: PRILOSEC Take 20 mg by mouth daily as needed (indigestion/heartburn.).   sitaGLIPtin 100 MG tablet Commonly known as: Januvia Take 1 tablet (100 mg total) by mouth daily. What changed: how much to take Changed by: Claretta Fraise, MD   ticagrelor 90 MG Tabs tablet Commonly  known as: BRILINTA Take 1 tablet (90 mg total) by mouth 2 (two) times daily.       Weight loss recommended.   Follow-up: Return in about 3 months (around 06/29/2022).  Claretta Fraise, M.D.

## 2022-03-30 LAB — LIPID PANEL
Chol/HDL Ratio: 6.6 ratio — ABNORMAL HIGH (ref 0.0–5.0)
Cholesterol, Total: 145 mg/dL (ref 100–199)
HDL: 22 mg/dL — ABNORMAL LOW (ref >39)
LDL Chol Calc (NIH): 42 mg/dL (ref 0–99)
Triglycerides: 564 mg/dL (ref 0–149)
VLDL Cholesterol Cal: 81 mg/dL — ABNORMAL HIGH (ref 5–40)

## 2022-03-30 LAB — CBC WITH DIFFERENTIAL/PLATELET
Basophils Absolute: 0.1 10*3/uL (ref 0.0–0.2)
Basos: 1 %
EOS (ABSOLUTE): 0.2 10*3/uL (ref 0.0–0.4)
Eos: 2 %
Hematocrit: 48 % (ref 37.5–51.0)
Hemoglobin: 16.4 g/dL (ref 13.0–17.7)
Immature Grans (Abs): 0 10*3/uL (ref 0.0–0.1)
Immature Granulocytes: 0 %
Lymphocytes Absolute: 2.9 10*3/uL (ref 0.7–3.1)
Lymphs: 37 %
MCH: 29.5 pg (ref 26.6–33.0)
MCHC: 34.2 g/dL (ref 31.5–35.7)
MCV: 87 fL (ref 79–97)
Monocytes Absolute: 0.7 10*3/uL (ref 0.1–0.9)
Monocytes: 9 %
Neutrophils Absolute: 4 10*3/uL (ref 1.4–7.0)
Neutrophils: 51 %
Platelets: 222 10*3/uL (ref 150–450)
RBC: 5.55 x10E6/uL (ref 4.14–5.80)
RDW: 12.8 % (ref 11.6–15.4)
WBC: 7.8 10*3/uL (ref 3.4–10.8)

## 2022-03-30 LAB — CMP14+EGFR
ALT: 28 IU/L (ref 0–44)
AST: 22 IU/L (ref 0–40)
Albumin/Globulin Ratio: 1.9 (ref 1.2–2.2)
Albumin: 4.7 g/dL (ref 4.1–5.1)
Alkaline Phosphatase: 112 IU/L (ref 44–121)
BUN/Creatinine Ratio: 13 (ref 9–20)
BUN: 12 mg/dL (ref 6–24)
Bilirubin Total: 0.3 mg/dL (ref 0.0–1.2)
CO2: 18 mmol/L — ABNORMAL LOW (ref 20–29)
Calcium: 8.9 mg/dL (ref 8.7–10.2)
Chloride: 103 mmol/L (ref 96–106)
Creatinine, Ser: 0.94 mg/dL (ref 0.76–1.27)
Globulin, Total: 2.5 g/dL (ref 1.5–4.5)
Glucose: 139 mg/dL — ABNORMAL HIGH (ref 70–99)
Potassium: 4.4 mmol/L (ref 3.5–5.2)
Sodium: 137 mmol/L (ref 134–144)
Total Protein: 7.2 g/dL (ref 6.0–8.5)
eGFR: 102 mL/min/{1.73_m2} (ref >59)

## 2022-04-01 ENCOUNTER — Other Ambulatory Visit: Payer: Self-pay | Admitting: Family Medicine

## 2022-04-01 MED ORDER — FENOFIBRATE 160 MG PO TABS: 160.0000 mg | ORAL_TABLET | Freq: Every day | ORAL | 3 refills | Status: DC

## 2022-04-11 ENCOUNTER — Other Ambulatory Visit: Payer: Self-pay | Admitting: Physician Assistant

## 2022-05-11 ENCOUNTER — Encounter: Payer: Self-pay | Admitting: Physician Assistant

## 2022-05-11 ENCOUNTER — Other Ambulatory Visit: Payer: Self-pay | Admitting: Physician Assistant

## 2022-05-11 ENCOUNTER — Ambulatory Visit: Payer: BC Managed Care – PPO | Attending: Physician Assistant | Admitting: Physician Assistant

## 2022-05-11 VITALS — BP 136/82 | HR 87 | Ht 71.0 in | Wt 254.4 lb

## 2022-05-11 DIAGNOSIS — L270 Generalized skin eruption due to drugs and medicaments taken internally: Secondary | ICD-10-CM

## 2022-05-11 DIAGNOSIS — I10 Essential (primary) hypertension: Secondary | ICD-10-CM | POA: Diagnosis not present

## 2022-05-11 DIAGNOSIS — Z720 Tobacco use: Secondary | ICD-10-CM

## 2022-05-11 DIAGNOSIS — E785 Hyperlipidemia, unspecified: Secondary | ICD-10-CM | POA: Diagnosis not present

## 2022-05-11 DIAGNOSIS — I2510 Atherosclerotic heart disease of native coronary artery without angina pectoris: Secondary | ICD-10-CM

## 2022-05-11 DIAGNOSIS — E669 Obesity, unspecified: Secondary | ICD-10-CM

## 2022-05-11 MED ORDER — ATORVASTATIN CALCIUM 80 MG PO TABS: 80.0000 mg | ORAL_TABLET | Freq: Every day | ORAL | 3 refills | Status: DC

## 2022-05-11 MED ORDER — TICAGRELOR 60 MG PO TABS: 60.0000 mg | ORAL_TABLET | Freq: Two times a day (BID) | ORAL | 3 refills | Status: DC

## 2022-05-11 NOTE — Assessment & Plan Note (Signed)
Fair control.  Blood pressure is usually optimal at home.  Continue amlodipine 10 mg daily, carvedilol 6.25 mg twice daily.  After his STEMI, he had a drug eruption that was likely either due to losartan or metoprolol tartrate.  If he needs better blood pressure control, we will need to consider thiazide diuretic.

## 2022-05-11 NOTE — Assessment & Plan Note (Signed)
His weight is increased about 25 pounds since he was here last.  I have recommended he try to lose 10 to 15 pounds.

## 2022-05-11 NOTE — Assessment & Plan Note (Signed)
S/p inf-lat STEMI in 03/2021 tx with DES to OM1 and staged PCI with DES to RCA c/b RV branch occlusion. He is overall doing well without chest discomfort to suggest angina.  He has some musculoskeletal pain from time to time.  He has a very long stent in the RCA as well as a stent in the OM1.  I will continue him on dual antiplatelet therapy for longer than 1 year, however I will reduce his dose of Brilinta. Decrease Brilinta to 60 mg twice daily Continue aspirin 81 mg daily, Lipitor 80 mg daily, carvedilol 6.25 mg twice daily, nitroglycerin as needed Follow-up in 1 year or sooner if needed

## 2022-05-11 NOTE — Assessment & Plan Note (Signed)
He is going to try to quit after the first of the year.

## 2022-05-11 NOTE — Assessment & Plan Note (Signed)
LDL is optimal.  Recent triglycerides over 500.  His PCP has placed him on fenofibrate.  Continue atorvastatin 80 mg daily.

## 2022-05-11 NOTE — Patient Instructions (Signed)
Medication Instructions:  Your physician has recommended you make the following change in your medication:   REDUCE Brilinta to 60 mg taking twice a day   *If you need a refill on your cardiac medications before your next appointment, please call your pharmacy*   Lab Work: None ordered  If you have labs (blood work) drawn today and your tests are completely normal, you will receive your results only by: MyChart Message (if you have MyChart) OR A paper copy in the mail If you have any lab test that is abnormal or we need to change your treatment, we will call you to review the results.   Testing/Procedures: None ordered   Follow-Up: At [facility], you and your health needs are our priority.  As part of our continuing mission to provide you with exceptional heart care, we have created designated Provider Care Teams.  These Care Teams include your primary Cardiologist (physician) and Advanced Practice Providers (APPs -  Physician Assistants and Nurse Practitioners) who all work together to provide you with the care you need, when you need it.  We recommend signing up for the patient portal called "MyChart".  Sign up information is provided on this After Visit Summary.  MyChart is used to connect with patients for Virtual Visits (Telemedicine).  Patients are able to view lab/test results, encounter notes, upcoming appointments, etc.  Non-urgent messages can be sent to your provider as well.   To learn more about what you can do with MyChart, go to [email or web address].    Your next appointment:   1 year(s)  The format for your next appointment:   In Person  Provider:   Tonny Bollman, MD  or Tereso Newcomer, PA-C         Other Instructions   Important Information About Sugar

## 2022-05-11 NOTE — Progress Notes (Signed)
Cardiology Office Note:    Date:  05/11/2022   ID:  Corey Robinson, DOB [birth year], MRN [id number]  PCP:  Mechele Claude, MD  [facility] Providers Cardiologist:  Tonny Bollman, MD Cardiology APP:  Kennon Rounds    Referring MD: Mechele Claude, MD   Chief Complaint:  Follow-up for CAD    Patient Profile: Coronary artery disease  Inf-lat STEMI 11/22 s/p 2.5 x 18 mm DES to OM1 Staged PCI: 3.5 x 48 mm DES to RCA; RV br occluded TTE 04/01/21: EF 55-60, no def WMA, Gr 1 DD, normal RVSF  C/b drug eruption (? Cause >> Losartan or Metoprolol Tartrate) Metoprolol tartrate ? to Carvedilol  Hypertension  Hyperlipidemia  Pre-diabetes     History of Present Illness:   Corey Robinson is a 46 y.o. male with the above problem list.  He was last seen 07/28/2021.  He returns for follow-up. He is here alone. He is doing well. He thinks he will try to quit smoking Jan 1. He has occasional chest pain and L arm pain. This seems to occur for a while after lifting something heavy. It is not like his prior angina. He has not had exertional symptoms.  He has not had significant shortness of breath.  He did go to [place] this past weekend and walked up about 90 steps and was winded at the top.  He has not had orthopnea, leg edema or syncope.  EKG:  not done    Reviewed and updated this encounter:  Tobacco  Allergies  Meds  Problems  Med Hx  Surg Hx  Fam Hx     ROS  Labs/Other Test Reviewed:   Recent Labs: 03/29/2022: ALT 28; BUN 12; Creatinine, Ser 0.94; Hemoglobin 16.4; Platelets 222; Potassium 4.4; Sodium 137   Recent Lipid Panel Recent Labs    03/29/22 0812  CHOL 145  TRIG 564*  HDL 22*  LDLCALC 42    Risk Assessment/Calculations/Metrics:             Physical Exam:   VS:  BP 136/82   Pulse 87   Ht 5\' 11"  (1.803 m)   Wt 254 lb 6.4 oz (115.4 kg)   SpO2 98%   BMI 35.48 kg/m    Wt Readings from Last 3 Encounters:  05/11/22 254 lb 6.4 oz (115.4 kg)   03/29/22 250 lb 9.6 oz (113.7 kg)  09/23/21 236 lb (107 kg)    Constitutional:      Appearance: Healthy appearance. Not in distress.  Neck:     Vascular: JVD normal.  Pulmonary:     Effort: Pulmonary effort is normal.     Breath sounds: No wheezing. No rales.  Cardiovascular:     Normal rate. Regular rhythm. Normal S1. Normal S2.      Murmurs: There is no murmur.  Edema:    Peripheral edema absent.  Abdominal:     Palpations: There is no hepatomegaly.         ASSESSMENT & PLAN:   Coronary artery disease involving native coronary artery of native heart without angina pectoris S/p inf-lat STEMI in 03/2021 tx with DES to OM1 and staged PCI with DES to RCA c/b RV branch occlusion. He is overall doing well without chest discomfort to suggest angina.  He has some musculoskeletal pain from time to time.  He has a very long stent in the RCA as well as a stent in the OM1.  I will continue him on  dual antiplatelet therapy for longer than 1 year, however I will reduce his dose of Brilinta. Decrease Brilinta to 60 mg twice daily Continue aspirin 81 mg daily, Lipitor 80 mg daily, carvedilol 6.25 mg twice daily, nitroglycerin as needed Follow-up in 1 year or sooner if needed  Primary hypertension Fair control.  Blood pressure is usually optimal at home.  Continue amlodipine 10 mg daily, carvedilol 6.25 mg twice daily.  After his STEMI, he had a drug eruption that was likely either due to losartan or metoprolol tartrate.  If he needs better blood pressure control, we will need to consider thiazide diuretic.  Hyperlipidemia LDL goal <70 LDL is optimal.  Recent triglycerides over 500.  His PCP has placed him on fenofibrate.  Continue atorvastatin 80 mg daily.  Tobacco abuse He is going to try to quit after the first of the year.  Obesity (BMI 30-39.9) His weight is increased about 25 pounds since he was here last.  I have recommended he try to lose 10 to 15 pounds.        Dispo:  Return in  about 1 year (around 05/12/2023) for Routine Follow Up, w/ Dr. Excell Seltzer, or Tereso Newcomer, PA-C.  Medication Adjustments/Labs and Tests Ordered: Current medicines are reviewed at length with the patient today.  Concerns regarding medicines are outlined above.  Tests Ordered: No orders of the defined types were placed in this encounter.  Medication Changes: Meds ordered this encounter  Medications   ticagrelor (BRILINTA) 60 MG TABS tablet    Sig: Take 1 tablet (60 mg total) by mouth 2 (two) times daily.    Dispense:  180 tablet    Refill:  3   atorvastatin (LIPITOR) 80 MG tablet    Sig: Take 1 tablet (80 mg total) by mouth daily.    Dispense:  90 tablet    Refill:  3   Signed, Tereso Newcomer, PA-C  05/11/2022 4:51 PM    [facility] [address] [place], [place], [place]  [address] Phone: [phone]; Fax: [phone]

## 2022-06-28 ENCOUNTER — Other Ambulatory Visit: Payer: Self-pay | Admitting: Cardiovascular Disease

## 2022-06-30 ENCOUNTER — Other Ambulatory Visit: Payer: Self-pay

## 2022-06-30 MED ORDER — AMLODIPINE BESYLATE 10 MG PO TABS: 10.0000 mg | ORAL_TABLET | Freq: Every day | ORAL | 3 refills | Status: DC

## 2022-06-30 NOTE — Telephone Encounter (Signed)
Pt's medication was sent to pt's pharmacy as requested. Confirmation received.  °

## 2022-07-05 ENCOUNTER — Ambulatory Visit: Payer: BC Managed Care – PPO | Admitting: Family Medicine

## 2022-07-13 ENCOUNTER — Encounter: Payer: Self-pay | Admitting: Family Medicine

## 2022-07-13 ENCOUNTER — Ambulatory Visit: Payer: BC Managed Care – PPO | Admitting: Family Medicine

## 2022-07-13 VITALS — BP 130/81 | HR 85 | Temp 97.9°F | Ht 71.0 in | Wt 257.8 lb

## 2022-07-13 DIAGNOSIS — I10 Essential (primary) hypertension: Secondary | ICD-10-CM | POA: Diagnosis not present

## 2022-07-13 DIAGNOSIS — Z7984 Long term (current) use of oral hypoglycemic drugs: Secondary | ICD-10-CM

## 2022-07-13 DIAGNOSIS — E1159 Type 2 diabetes mellitus with other circulatory complications: Secondary | ICD-10-CM | POA: Diagnosis not present

## 2022-07-13 DIAGNOSIS — E785 Hyperlipidemia, unspecified: Secondary | ICD-10-CM | POA: Diagnosis not present

## 2022-07-13 LAB — LIPID PANEL

## 2022-07-13 MED ORDER — LANCET DEVICE MISC: 1.0000 | Freq: Three times a day (TID) | 0 refills | Status: AC

## 2022-07-13 MED ORDER — BLOOD GLUCOSE TEST VI STRP: 1.0000 | ORAL_STRIP | Freq: Three times a day (TID) | 0 refills | Status: AC

## 2022-07-13 MED ORDER — LANCETS MISC. MISC: 1.0000 | Freq: Three times a day (TID) | 0 refills | Status: AC

## 2022-07-13 MED ORDER — BLOOD GLUCOSE MONITORING SUPPL DEVI: 1.0000 | Freq: Three times a day (TID) | 0 refills | Status: AC

## 2022-07-13 NOTE — Progress Notes (Signed)
Subjective:  Patient ID: Corey Robinson, male    DOB: [birth year]  Age: 47 y.o. MRN: [id number]  CC: Medical Management of Chronic Issues   HPI NASHWAN BARBA presents forFollow-up of diabetes.Working on [employer] in diet. Decreasing bread. Eating more fruit and veggies. Patient denies symptoms such as polyuria, polydipsia, excessive hunger, nausea No significant hypoglycemic spells noted. Medications reviewed. Pt reports taking them regularly without complication/adverse reaction being reported today.    History Marguise has a past medical history of CAD (coronary artery disease) (04/03/2021), Drug eruption (04/10/2021), HLD (hyperlipidemia) (04/04/2021), HTN (hypertension), Polycythemia, and Pre-diabetes (01/04/2017).   He has a past surgical history that includes LEFT HEART CATH AND CORONARY ANGIOGRAPHY (N/A, 04/01/2021); CORONARY STENT INTERVENTION (N/A, 04/01/2021); CORONARY STENT INTERVENTION (N/A, 04/03/2021); and CORONARY ANGIOGRAPHY (N/A, 04/03/2021).   His family history is not on file.He reports that he has been smoking cigarettes. He has a 12.50 pack-year smoking history. He uses smokeless tobacco. No history on file for alcohol use and drug use.  Current Outpatient Medications on File Prior to Visit  Medication Sig Dispense Refill   acetaminophen (TYLENOL) 500 MG tablet Take 1,000 mg by mouth every 6 (six) hours as needed for mild pain, headache or fever.     amLODipine (NORVASC) 10 MG tablet Take 1 tablet (10 mg total) by mouth daily. 90 tablet 3   aspirin EC 81 MG tablet Take 81 mg by mouth daily. Swallow whole.     atorvastatin (LIPITOR) 80 MG tablet Take 1 tablet (80 mg total) by mouth daily. 90 tablet 3   betamethasone dipropionate 0.05 % cream 2 times daily per day X 1 week. 30 g 0   carvedilol (COREG) 6.25 MG tablet Take 1 tablet (6.25 mg total) by mouth 2 (two) times daily. 180 tablet 3   diphenhydrAMINE (BENADRYL ALLERGY) 25 mg capsule Take 1 capsule (25 mg  total) by mouth every 6 (six) hours as needed. 30 capsule 0   escitalopram (LEXAPRO) 10 MG tablet Take 1 tablet (10 mg total) by mouth daily. 90 tablet 3   fenofibrate 160 MG tablet Take 1 tablet (160 mg total) by mouth daily. For cholesterol and triglyceride 90 tablet 3   nitroGLYCERIN (NITROSTAT) 0.4 MG SL tablet Place 1 tablet (0.4 mg total) under the tongue every 5 (five) minutes as needed for chest pain. 25 tablet 12   Omega-3 Fatty Acids (FISH OIL PO) Take 1,200 mg by mouth every evening.     omeprazole (PRILOSEC) 20 MG capsule Take 20 mg by mouth daily as needed (indigestion/heartburn.).     sitaGLIPtin (JANUVIA) 100 MG tablet Take 1 tablet (100 mg total) by mouth daily. 90 tablet 3   ticagrelor (BRILINTA) 60 MG TABS tablet Take 1 tablet (60 mg total) by mouth 2 (two) times daily. 180 tablet 3   No current facility-administered medications on file prior to visit.    ROS Review of Systems  Constitutional:  Negative for fever.  Respiratory:  Negative for shortness of breath.   Cardiovascular:  Negative for chest pain.  Musculoskeletal:  Negative for arthralgias.  Skin:  Negative for rash.    Objective:  BP 130/81   Pulse 85   Temp 97.9 F (36.6 C)   Ht 5' 11"$  (1.803 m)   Wt 257 lb 12.8 oz (116.9 kg)   SpO2 98%   BMI 35.96 kg/m   BP Readings from Last 3 Encounters:  07/13/22 130/81  05/11/22 136/82  03/29/22 128/84    Wt Readings  from Last 3 Encounters:  07/13/22 257 lb 12.8 oz (116.9 kg)  05/11/22 254 lb 6.4 oz (115.4 kg)  03/29/22 250 lb 9.6 oz (113.7 kg)     Physical Exam Vitals reviewed.  Constitutional:      Appearance: He is well-developed.  HENT:     Head: Normocephalic and atraumatic.     Right Ear: External ear normal.     Left Ear: External ear normal.     Mouth/Throat:     Pharynx: No oropharyngeal exudate or posterior oropharyngeal erythema.  Eyes:     Pupils: Pupils are equal, round, and reactive to light.  Cardiovascular:     Rate and  Rhythm: Normal rate and regular rhythm.     Heart sounds: No murmur heard. Pulmonary:     Effort: No respiratory distress.     Breath sounds: Normal breath sounds.  Musculoskeletal:     Cervical back: Normal range of motion and neck supple.  Neurological:     Mental Status: He is alert and oriented to person, place, and time.       Assessment & Plan:   Arush was seen today for medical management of chronic issues.  Diagnoses and all orders for this visit:  Type 2 diabetes mellitus with other circulatory complication, without long-term current use of insulin (HCC) -     Bayer DCA Hb A1c Waived -     Microalbumin / creatinine urine ratio  Hyperlipidemia LDL goal <70 -     Lipid panel  Primary hypertension -     CBC with Differential/Platelet -     CMP14+EGFR  Other orders -     Blood Glucose Monitoring Suppl DEVI; 1 each by Does not apply route in the morning, at noon, and at bedtime. May substitute to any manufacturer covered by patient's insurance. -     Glucose Blood (BLOOD GLUCOSE TEST STRIPS) STRP; 1 each by In Vitro route in the morning, at noon, and at bedtime. May substitute to any manufacturer covered by patient's insurance. -     Lancet Device MISC; 1 each by Does not apply route in the morning, at noon, and at bedtime. May substitute to any manufacturer covered by patient's insurance. -     Lancets Misc. MISC; 1 each by Does not apply route in the morning, at noon, and at bedtime. May substitute to any manufacturer covered by patient's insurance.      I am having Leory Plowman T. Christenberry start on Blood Glucose Monitoring Suppl, BLOOD GLUCOSE TEST STRIPS, Lancet Device, and Lancets Misc.. I am also having him maintain his Omega-3 Fatty Acids (FISH OIL PO), aspirin EC, acetaminophen, nitroGLYCERIN, betamethasone dipropionate, diphenhydrAMINE, omeprazole, carvedilol, escitalopram, sitaGLIPtin, fenofibrate, ticagrelor, atorvastatin, and amLODipine.  Meds ordered this  encounter  Medications   Blood Glucose Monitoring Suppl DEVI    Sig: 1 each by Does not apply route in the morning, at noon, and at bedtime. May substitute to any manufacturer covered by patient's insurance.    Dispense:  1 each    Refill:  0   Glucose Blood (BLOOD GLUCOSE TEST STRIPS) STRP    Sig: 1 each by In Vitro route in the morning, at noon, and at bedtime. May substitute to any manufacturer covered by patient's insurance.    Dispense:  100 strip    Refill:  0   Lancet Device MISC    Sig: 1 each by Does not apply route in the morning, at noon, and at bedtime. May substitute to any  manufacturer covered by [employer].    Dispense:  1 each    Refill:  0   Lancets Misc. MISC    Sig: 1 each by Does not apply route in the morning, at noon, and at bedtime. May substitute to any manufacturer covered by patient's insurance.    Dispense:  100 each    Refill:  0   Discussed smoking cessation. Needs to start checking glucose fasting  Follow-up: Return in about 3 months (around 10/11/2022).  Claretta Fraise, M.D.

## 2022-07-14 LAB — CMP14+EGFR
ALT: 28 IU/L (ref 0–44)
AST: 25 IU/L (ref 0–40)
Albumin/Globulin Ratio: 2.2 (ref 1.2–2.2)
Albumin: 4.8 g/dL (ref 4.1–5.1)
Alkaline Phosphatase: 80 IU/L (ref 44–121)
BUN/Creatinine Ratio: 11 (ref 9–20)
BUN: 12 mg/dL (ref 6–24)
Bilirubin Total: 0.5 mg/dL (ref 0.0–1.2)
CO2: 22 mmol/L (ref 20–29)
Calcium: 9.3 mg/dL (ref 8.7–10.2)
Chloride: 101 mmol/L (ref 96–106)
Creatinine, Ser: 1.14 mg/dL (ref 0.76–1.27)
Globulin, Total: 2.2 g/dL (ref 1.5–4.5)
Glucose: 225 mg/dL — ABNORMAL HIGH (ref 70–99)
Potassium: 3.8 mmol/L (ref 3.5–5.2)
Sodium: 137 mmol/L (ref 134–144)
Total Protein: 7 g/dL (ref 6.0–8.5)
eGFR: 81 mL/min/{1.73_m2} (ref >59)

## 2022-07-14 LAB — CBC WITH DIFFERENTIAL/PLATELET
Basophils Absolute: 0.1 10*3/uL (ref 0.0–0.2)
Basos: 1 %
EOS (ABSOLUTE): 0.1 10*3/uL (ref 0.0–0.4)
Eos: 2 %
Hematocrit: 44.9 % (ref 37.5–51.0)
Hemoglobin: 15.4 g/dL (ref 13.0–17.7)
Immature Grans (Abs): 0 10*3/uL (ref 0.0–0.1)
Immature Granulocytes: 0 %
Lymphocytes Absolute: 3 10*3/uL (ref 0.7–3.1)
Lymphs: 40 %
MCH: 29.2 pg (ref 26.6–33.0)
MCHC: 34.3 g/dL (ref 31.5–35.7)
MCV: 85 fL (ref 79–97)
Monocytes Absolute: 0.5 10*3/uL (ref 0.1–0.9)
Monocytes: 7 %
Neutrophils Absolute: 3.8 10*3/uL (ref 1.4–7.0)
Neutrophils: 50 %
Platelets: 238 10*3/uL (ref 150–450)
RBC: 5.27 x10E6/uL (ref 4.14–5.80)
RDW: 12.5 % (ref 11.6–15.4)
WBC: 7.6 10*3/uL (ref 3.4–10.8)

## 2022-07-14 LAB — HM DIABETES EYE EXAM

## 2022-07-14 LAB — LIPID PANEL
Chol/HDL Ratio: 5.8 ratio — ABNORMAL HIGH (ref 0.0–5.0)
Cholesterol, Total: 133 mg/dL (ref 100–199)
HDL: 23 mg/dL — ABNORMAL LOW (ref >39)
LDL Chol Calc (NIH): 70 mg/dL (ref 0–99)
Triglycerides: 242 mg/dL — ABNORMAL HIGH (ref 0–149)
VLDL Cholesterol Cal: 40 mg/dL (ref 5–40)

## 2022-07-14 LAB — BAYER DCA HB A1C WAIVED: HB A1C (BAYER DCA - WAIVED): 7.2 % — ABNORMAL HIGH (ref 4.8–5.6)

## 2022-07-14 LAB — MICROALBUMIN / CREATININE URINE RATIO
Creatinine, Urine: 116 mg/dL
Microalb/Creat Ratio: 11 mg/g creat (ref 0–29)
Microalbumin, Urine: 12.6 ug/mL

## 2022-07-14 NOTE — Progress Notes (Signed)
Hello Ved,  Your lab result is normal and/or stable.Some minor variations that are not significant are commonly marked abnormal, but do not represent any medical problem for you.  Best regards, Claretta Fraise, M.D.

## 2022-08-10 ENCOUNTER — Telehealth (INDEPENDENT_AMBULATORY_CARE_PROVIDER_SITE_OTHER): Payer: BC Managed Care – PPO | Admitting: Family

## 2022-08-10 ENCOUNTER — Encounter: Payer: Self-pay | Admitting: Family

## 2022-08-10 DIAGNOSIS — R7303 Prediabetes: Secondary | ICD-10-CM

## 2022-08-10 DIAGNOSIS — L255 Unspecified contact dermatitis due to plants, except food: Secondary | ICD-10-CM

## 2022-08-10 MED ORDER — TRIAMCINOLONE ACETONIDE 0.5 % EX OINT: 1.0000 | TOPICAL_OINTMENT | Freq: Two times a day (BID) | CUTANEOUS | 0 refills | Status: DC

## 2022-08-10 MED ORDER — PREDNISONE 10 MG (21) PO TBPK: ORAL_TABLET | ORAL | 0 refills | Status: DC

## 2022-08-10 NOTE — Progress Notes (Signed)
Virtual Visit Consent   Corey Robinson, you are scheduled for a virtual visit with a [facility] provider today. Just as with appointments in the office, your consent must be obtained to participate. Your consent will be active for this visit and any virtual visit you may have with one of our providers in the next 365 days. If you have a MyChart account, a copy of this consent can be sent to you electronically.  As this is a virtual visit, video technology does not allow for your provider to perform a traditional examination. This may limit your provider's ability to fully assess your condition. If your provider identifies any concerns that need to be evaluated in person or the need to arrange testing (such as labs, EKG, etc.), we will make arrangements to do so. Although advances in technology are sophisticated, we cannot ensure that it will always work on either your end or our end. If the connection with a video visit is poor, the visit may have to be switched to a telephone visit. With either a video or telephone visit, we are not always able to ensure that we have a secure connection.  By engaging in this virtual visit, you consent to the provision of healthcare and authorize for your insurance to be billed (if applicable) for the services provided during this visit. Depending on your insurance coverage, you may receive a charge related to this service.  I need to obtain your verbal consent now. Are you willing to proceed with your visit today? COLLIE BAMBRICK has provided verbal consent on 08/10/2022 for a virtual visit (video or telephone). Evelina Dun, FNP  Date: 08/10/2022 11:53 AM  Virtual Visit via Video Note   I, Evelina Dun, connected with  Corey Robinson  ([id number], Jun 09, 1975) on 08/10/22 at 11:50 AM EDT by a video-enabled telemedicine application and verified that I am speaking with the correct person using two identifiers.  Location: Patient: Virtual Visit Location Patient:  Other: work Provider: Ecologist: Home Office   I discussed the limitations of evaluation and management by telemedicine and the availability of in person appointments. The patient expressed understanding and agreed to proceed.    History of Present Illness: Corey Robinson is a 47 y.o. who identifies as a male who was assigned male at birth, and is being seen today for poison oak rash on face that started Sunday.  HPI: Rash This is a new problem. The current episode started in the past 7 days. The problem has been gradually worsening since onset. The affected locations include the face. The rash is characterized by blistering and itchiness. He was exposed to plant contact. Past treatments include nothing. The treatment provided no relief.    Problems:  Patient Active Problem List   Diagnosis Date Noted   Obesity (BMI 30-39.9) 05/11/2022   Anxiety 04/21/2021   Tobacco abuse 04/21/2021   Hyperlipidemia LDL goal <70 04/04/2021   Primary hypertension 04/04/2021   Coronary artery disease involving native coronary artery of native heart without angina pectoris 04/03/2021   s/p Inf-Lat STEMI in 03/2021 04/01/2021   Pre-diabetes 01/04/2017   Dyspnea 01/04/2017   Contact dermatitis 01/20/2015   Polycythemia     Allergies:  Allergies  Allergen Reactions   Losartan Rash    ???-Drug eruption noted post hosp>>Losartan, Metoprolol Tartrate, Atorvastatin all DCd w resolution   Metoprolol Tartrate Rash    ???-Drug eruption noted post hosp in 03/2021>>Losartan, Metoprolol Tartrate, Atorvastatin all DCd w resolution  Medications:  Current Outpatient Medications:    predniSONE (STERAPRED UNI-PAK 21 TAB) 10 MG (21) TBPK tablet, Use as directed, Disp: 21 tablet, Rfl: 0   triamcinolone ointment (KENALOG) 0.5 %, Apply 1 Application topically 2 (two) times daily., Disp: 30 g, Rfl: 0   acetaminophen (TYLENOL) 500 MG tablet, Take 1,000 mg by mouth every 6 (six) hours as needed for  mild pain, headache or fever., Disp: , Rfl:    amLODipine (NORVASC) 10 MG tablet, Take 1 tablet (10 mg total) by mouth daily., Disp: 90 tablet, Rfl: 3   aspirin EC 81 MG tablet, Take 81 mg by mouth daily. Swallow whole., Disp: , Rfl:    atorvastatin (LIPITOR) 80 MG tablet, Take 1 tablet (80 mg total) by mouth daily., Disp: 90 tablet, Rfl: 3   betamethasone dipropionate 0.05 % cream, 2 times daily per day X 1 week., Disp: 30 g, Rfl: 0   Blood Glucose Monitoring Suppl DEVI, 1 each by Does not apply route in the morning, at noon, and at bedtime. May substitute to any manufacturer covered by patient's insurance., Disp: 1 each, Rfl: 0   carvedilol (COREG) 6.25 MG tablet, Take 1 tablet (6.25 mg total) by mouth 2 (two) times daily., Disp: 180 tablet, Rfl: 3   diphenhydrAMINE (BENADRYL ALLERGY) 25 mg capsule, Take 1 capsule (25 mg total) by mouth every 6 (six) hours as needed., Disp: 30 capsule, Rfl: 0   escitalopram (LEXAPRO) 10 MG tablet, Take 1 tablet (10 mg total) by mouth daily., Disp: 90 tablet, Rfl: 3   fenofibrate 160 MG tablet, Take 1 tablet (160 mg total) by mouth daily. For cholesterol and triglyceride, Disp: 90 tablet, Rfl: 3   Glucose Blood (BLOOD GLUCOSE TEST STRIPS) STRP, 1 each by In Vitro route in the morning, at noon, and at bedtime. May substitute to any manufacturer covered by patient's insurance., Disp: 100 strip, Rfl: 0   Lancet Device MISC, 1 each by Does not apply route in the morning, at noon, and at bedtime. May substitute to any manufacturer covered by patient's insurance., Disp: 1 each, Rfl: 0   Lancets Misc. MISC, 1 each by Does not apply route in the morning, at noon, and at bedtime. May substitute to any manufacturer covered by patient's insurance., Disp: 100 each, Rfl: 0   nitroGLYCERIN (NITROSTAT) 0.4 MG SL tablet, Place 1 tablet (0.4 mg total) under the tongue every 5 (five) minutes as needed for chest pain., Disp: 25 tablet, Rfl: 12   Omega-3 Fatty Acids (FISH OIL PO), Take  1,200 mg by mouth every evening., Disp: , Rfl:    omeprazole (PRILOSEC) 20 MG capsule, Take 20 mg by mouth daily as needed (indigestion/heartburn.)., Disp: , Rfl:    sitaGLIPtin (JANUVIA) 100 MG tablet, Take 1 tablet (100 mg total) by mouth daily., Disp: 90 tablet, Rfl: 3   ticagrelor (BRILINTA) 60 MG TABS tablet, Take 1 tablet (60 mg total) by mouth 2 (two) times daily., Disp: 180 tablet, Rfl: 3  Observations/Objective: Patient is well-developed, well-nourished in no acute distress.  Resting comfortably  at home.  Head is normocephalic, atraumatic.  No labored breathing.  Speech is clear and coherent with logical content.  Patient is alert and oriented at baseline.  Erythemas blister rash on forehead and temple area   Assessment and Plan: 1. Contact dermatitis due to plant - predniSONE (STERAPRED UNI-PAK 21 TAB) 10 MG (21) TBPK tablet; Use as directed  Dispense: 21 tablet; Refill: 0 - triamcinolone ointment (KENALOG) 0.5 %; Apply 1  Application topically 2 (two) times daily.  Dispense: 30 g; Refill: 0  2. Pre-diabetes  Avoid scratching Keep clean and dry Strict low carb diet If rash spreads to eye needs to be seen in person  Follow Up Instructions: I discussed the assessment and treatment plan with the patient. The patient was provided an opportunity to ask questions and all were answered. The patient agreed with the plan and demonstrated an understanding of the instructions.  A copy of instructions were sent to the patient via MyChart unless otherwise noted below.    The patient was advised to call back or seek an in-person evaluation if the symptoms worsen or if the condition fails to improve as anticipated.  Time:  I spent 10 minutes with the patient via telehealth technology discussing the above problems/concerns.    Evelina Dun, FNP

## 2022-08-18 ENCOUNTER — Other Ambulatory Visit: Payer: Self-pay | Admitting: Cardiovascular Disease

## 2022-08-18 MED ORDER — CARVEDILOL 6.25 MG PO TABS: 6.2500 mg | ORAL_TABLET | Freq: Two times a day (BID) | ORAL | 2 refills | Status: DC

## 2022-08-18 NOTE — Addendum Note (Signed)
Addended by: Carter Kitten D on: 08/18/2022 04:50 PM   Modules accepted: Orders

## 2022-08-18 NOTE — Addendum Note (Signed)
Addended by: Carter Kitten D on: 08/18/2022 04:47 PM   Modules accepted: Orders

## 2022-08-30 ENCOUNTER — Encounter: Payer: Self-pay | Admitting: *Deleted

## 2022-10-12 ENCOUNTER — Ambulatory Visit: Payer: BC Managed Care – PPO | Admitting: Family Medicine

## 2022-10-21 ENCOUNTER — Encounter: Payer: Self-pay | Admitting: Family Medicine

## 2022-10-21 ENCOUNTER — Ambulatory Visit: Payer: BC Managed Care – PPO | Admitting: Family Medicine

## 2022-10-31 ENCOUNTER — Other Ambulatory Visit: Payer: Self-pay | Admitting: Family Medicine

## 2022-11-03 ENCOUNTER — Other Ambulatory Visit: Payer: Self-pay | Admitting: Family Medicine

## 2022-11-03 NOTE — Telephone Encounter (Signed)
30 days given today - ntbs  ?

## 2022-11-11 ENCOUNTER — Other Ambulatory Visit: Payer: Self-pay | Admitting: Family Medicine

## 2022-11-18 ENCOUNTER — Ambulatory Visit: Payer: BC Managed Care – PPO | Admitting: Family Medicine

## 2022-11-18 ENCOUNTER — Encounter: Payer: Self-pay | Admitting: Family Medicine

## 2022-11-18 VITALS — BP 115/74 | HR 67 | Temp 97.8°F | Ht 71.0 in | Wt 248.4 lb

## 2022-11-18 DIAGNOSIS — E785 Hyperlipidemia, unspecified: Secondary | ICD-10-CM | POA: Diagnosis not present

## 2022-11-18 DIAGNOSIS — E1159 Type 2 diabetes mellitus with other circulatory complications: Secondary | ICD-10-CM | POA: Diagnosis not present

## 2022-11-18 DIAGNOSIS — I152 Hypertension secondary to endocrine disorders: Secondary | ICD-10-CM

## 2022-11-18 DIAGNOSIS — I10 Essential (primary) hypertension: Secondary | ICD-10-CM

## 2022-11-18 DIAGNOSIS — I70219 Atherosclerosis of native arteries of extremities with intermittent claudication, unspecified extremity: Secondary | ICD-10-CM

## 2022-11-18 LAB — CBC WITH DIFFERENTIAL/PLATELET
Basophils Absolute: 0.1 10*3/uL (ref 0.0–0.2)
Basos: 1 %
EOS (ABSOLUTE): 0.2 10*3/uL (ref 0.0–0.4)
Eos: 2 %
Hematocrit: 44.3 % (ref 37.5–51.0)
Hemoglobin: 15.5 g/dL (ref 13.0–17.7)
Immature Grans (Abs): 0 10*3/uL (ref 0.0–0.1)
Immature Granulocytes: 0 %
Lymphocytes Absolute: 2.8 10*3/uL (ref 0.7–3.1)
Lymphs: 35 %
MCH: 30.8 pg (ref 26.6–33.0)
MCHC: 35 g/dL (ref 31.5–35.7)
MCV: 88 fL (ref 79–97)
Monocytes Absolute: 0.7 10*3/uL (ref 0.1–0.9)
Monocytes: 8 %
Neutrophils Absolute: 4.1 10*3/uL (ref 1.4–7.0)
Neutrophils: 54 %
Platelets: 215 10*3/uL (ref 150–450)
RBC: 5.03 x10E6/uL (ref 4.14–5.80)
RDW: 12.7 % (ref 11.6–15.4)
WBC: 7.8 10*3/uL (ref 3.4–10.8)

## 2022-11-18 LAB — CMP14+EGFR
ALT: 28 IU/L (ref 0–44)
AST: 22 IU/L (ref 0–40)
Albumin: 4.5 g/dL (ref 4.1–5.1)
Alkaline Phosphatase: 69 IU/L (ref 44–121)
BUN/Creatinine Ratio: 13 (ref 9–20)
BUN: 15 mg/dL (ref 6–24)
Bilirubin Total: 0.6 mg/dL (ref 0.0–1.2)
CO2: 18 mmol/L — ABNORMAL LOW (ref 20–29)
Calcium: 9.4 mg/dL (ref 8.7–10.2)
Chloride: 104 mmol/L (ref 96–106)
Creatinine, Ser: 1.17 mg/dL (ref 0.76–1.27)
Globulin, Total: 2.5 g/dL (ref 1.5–4.5)
Glucose: 106 mg/dL — ABNORMAL HIGH (ref 70–99)
Potassium: 4.3 mmol/L (ref 3.5–5.2)
Sodium: 136 mmol/L (ref 134–144)
Total Protein: 7 g/dL (ref 6.0–8.5)
eGFR: 78 mL/min/{1.73_m2} (ref >59)

## 2022-11-18 LAB — LIPID PANEL
Chol/HDL Ratio: 5.4 ratio — ABNORMAL HIGH (ref 0.0–5.0)
Cholesterol, Total: 113 mg/dL (ref 100–199)
HDL: 21 mg/dL — ABNORMAL LOW (ref >39)
LDL Chol Calc (NIH): 67 mg/dL (ref 0–99)
Triglycerides: 139 mg/dL (ref 0–149)
VLDL Cholesterol Cal: 25 mg/dL (ref 5–40)

## 2022-11-18 LAB — BAYER DCA HB A1C WAIVED: HB A1C (BAYER DCA - WAIVED): 5.9 % — ABNORMAL HIGH (ref 4.8–5.6)

## 2022-11-18 NOTE — Progress Notes (Signed)
Subjective:  Patient ID: Corey Robinson,  male    DOB: [birth year]  Age: 47 y.o.    CC: Medical Management of Chronic Issues   HPI Corey Robinson presents for  follow-up of hypertension. Patient has no history of headache chest pain or shortness of breath or recent cough. Patient also denies symptoms of TIA such as numbness weakness lateralizing. Patient denies side effects from medication. States taking it regularly.  Patient also  in for follow-up of elevated cholesterol. Doing well without complaints on current medication. Denies side effects  including myalgia and arthralgia and nausea. Also in today for liver function testing. Currently no chest pain, shortness of breath or other cardiovascular related symptoms noted.  Follow-up of diabetes. Patient does check blood sugar at home. Readings run between 111 and 123 Patient denies symptoms such as excessive hunger or urinary frequency, excessive hunger, nausea No significant hypoglycemic spells noted.1- Medications reviewed. Pt reports taking them regularly. Pt. denies complication/adverse reaction today.    History Corey Robinson has a past medical history of CAD (coronary artery disease) (04/03/2021), Drug eruption (04/10/2021), HLD (hyperlipidemia) (04/04/2021), HTN (hypertension), Polycythemia, and Pre-diabetes (01/04/2017).   He has a past surgical history that includes LEFT HEART CATH AND CORONARY ANGIOGRAPHY (N/A, 04/01/2021); CORONARY STENT INTERVENTION (N/A, 04/01/2021); CORONARY STENT INTERVENTION (N/A, 04/03/2021); and CORONARY ANGIOGRAPHY (N/A, 04/03/2021).   His family history is not on file.He reports that he has been smoking cigarettes. He has a 12.50 pack-year smoking history. He uses smokeless tobacco. No history on file for alcohol use and drug use.  Current Outpatient Medications on File Prior to Visit  Medication Sig Dispense Refill   acetaminophen (TYLENOL) 500 MG tablet Take 1,000 mg by mouth every 6 (six) hours as  needed for mild pain, headache or fever.     amLODipine (NORVASC) 10 MG tablet Take 1 tablet (10 mg total) by mouth daily. 90 tablet 3   aspirin EC 81 MG tablet Take 81 mg by mouth daily. Swallow whole.     atorvastatin (LIPITOR) 80 MG tablet Take 1 tablet (80 mg total) by mouth daily. 90 tablet 3   betamethasone dipropionate 0.05 % cream 2 times daily per day X 1 week. 30 g 0   Blood Glucose Monitoring Suppl DEVI 1 each by Does not apply route in the morning, at noon, and at bedtime. May substitute to any manufacturer covered by patient's insurance. 1 each 0   carvedilol (COREG) 6.25 MG tablet Take 1 tablet (6.25 mg total) by mouth 2 (two) times daily. 180 tablet 2   diphenhydrAMINE (BENADRYL ALLERGY) 25 mg capsule Take 1 capsule (25 mg total) by mouth every 6 (six) hours as needed. 30 capsule 0   fenofibrate 160 MG tablet Take 1 tablet (160 mg total) by mouth daily. For cholesterol and triglyceride 90 tablet 3   nitroGLYCERIN (NITROSTAT) 0.4 MG SL tablet Place 1 tablet (0.4 mg total) under the tongue every 5 (five) minutes as needed for chest pain. 25 tablet 12   Omega-3 Fatty Acids (FISH OIL PO) Take 1,200 mg by mouth every evening.     omeprazole (PRILOSEC) 20 MG capsule Take 20 mg by mouth daily as needed (indigestion/heartburn.).     sitaGLIPtin (JANUVIA) 100 MG tablet Take 1 tablet (100 mg total) by mouth daily. 90 tablet 3   ticagrelor (BRILINTA) 60 MG TABS tablet Take 1 tablet (60 mg total) by mouth 2 (two) times daily. 180 tablet 3   triamcinolone ointment (KENALOG) 0.5 % Apply 1  Application topically 2 (two) times daily. 30 g 0   No current facility-administered medications on file prior to visit.    ROS Review of Systems  Constitutional:  Negative for fever.  Respiratory:  Negative for shortness of breath.   Cardiovascular:  Negative for chest pain.  Musculoskeletal:  Positive for myalgias (RLE, with use). Negative for arthralgias.  Skin:  Negative for rash.    Objective:  BP  115/74   Pulse 67   Temp 97.8 F (36.6 C)   Ht 5\' 11"  (1.803 m)   Wt 248 lb 6.4 oz (112.7 kg)   SpO2 96%   BMI 34.64 kg/m   BP Readings from Last 3 Encounters:  11/18/22 115/74  07/13/22 130/81  05/11/22 136/82    Wt Readings from Last 3 Encounters:  11/18/22 248 lb 6.4 oz (112.7 kg)  07/13/22 257 lb 12.8 oz (116.9 kg)  05/11/22 254 lb 6.4 oz (115.4 kg)     Physical Exam Vitals reviewed.  Constitutional:      Appearance: He is well-developed.  HENT:     Head: Normocephalic and atraumatic.     Right Ear: External ear normal.     Left Ear: External ear normal.     Mouth/Throat:     Pharynx: No oropharyngeal exudate or posterior oropharyngeal erythema.  Eyes:     Pupils: Pupils are equal, round, and reactive to light.  Cardiovascular:     Rate and Rhythm: Normal rate and regular rhythm.     Heart sounds: No murmur heard. Pulmonary:     Effort: No respiratory distress.     Breath sounds: Normal breath sounds.  Musculoskeletal:     Cervical back: Normal range of motion and neck supple.  Neurological:     Mental Status: He is alert and oriented to person, place, and time.     Diabetic Foot Exam - Simple   No data filed     Lab Results  Component Value Date   HGBA1C 5.9 (H) 11/18/2022   HGBA1C 7.2 (H) 07/13/2022   HGBA1C 6.6 (H) 03/29/2022    Assessment & Plan:   Corey Robinson was seen today for medical management of chronic issues.  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 Needs  office visit for further refills   I am having Corey Robinson maintain his Omega-3 Fatty Acids (FISH OIL PO), aspirin EC, acetaminophen, nitroGLYCERIN, betamethasone dipropionate, diphenhydrAMINE, omeprazole, sitaGLIPtin, fenofibrate, ticagrelor, atorvastatin, amLODipine, Blood Glucose Monitoring Suppl, triamcinolone ointment, carvedilol, and escitalopram.  Meds ordered this encounter  Medications   escitalopram (LEXAPRO) 10 MG tablet    Sig: Take 1 tablet (10 mg total) by mouth daily. Needs office visit for further refills    Dispense:  90 tablet    Refill:  3     Follow-up: Return in about 3 months (around 02/18/2023).  Mechele Claude, M.D.

## 2022-11-21 ENCOUNTER — Encounter: Payer: Self-pay | Admitting: Family Medicine

## 2022-11-21 MED ORDER — ESCITALOPRAM OXALATE 10 MG PO TABS: 10.0000 mg | ORAL_TABLET | Freq: Every day | ORAL | 3 refills | Status: DC

## 2022-11-23 NOTE — Progress Notes (Signed)
Hello Kane,  Your lab result is normal and/or stable.Some minor variations that are not significant are commonly marked abnormal, but do not represent any medical problem for you.  Best regards, Abhinav Mayorquin, M.D.

## 2022-12-01 ENCOUNTER — Other Ambulatory Visit: Payer: Self-pay | Admitting: *Deleted

## 2022-12-01 DIAGNOSIS — M79606 Pain in leg, unspecified: Secondary | ICD-10-CM

## 2022-12-02 ENCOUNTER — Ambulatory Visit (HOSPITAL_COMMUNITY)
Admission: RE | Admit: 2022-12-02 | Discharge: 2022-12-02 | Disposition: A | Discharge status: Home / Self Care | Payer: BC Managed Care – PPO | Source: Ambulatory Visit | Attending: Vascular Surgery | Admitting: Vascular Surgery

## 2022-12-02 DIAGNOSIS — M79606 Pain in leg, unspecified: Secondary | ICD-10-CM | POA: Diagnosis not present

## 2022-12-02 LAB — VAS US ABI WITH/WO TBI
Left ABI: 1.1
Right ABI: 1.23

## 2022-12-19 NOTE — Progress Notes (Signed)
[facility] [place]  ASSESSMENT / PLAN: 47 y.o. male with paresthesias of the right lower extremity.  No evidence of peripheral arterial disease on clinical exam or noninvasive testing today.  I am suspicious that he has a musculoskeletal cause for his symptoms.  I encouraged him to follow-up with his primary care physician.  He can follow-up with me on an as-needed basis.  CHIEF COMPLAINT: Right leg paresthesias  HISTORY OF PRESENT ILLNESS: Corey Robinson is a 47 y.o. male who presents to clinic for evaluation of right leg paresthesias.  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  He is a smoker.  He has a history of coronary artery disease.  He reports paresthesias in his right lower extremity which began when standing for prolonged period of time.  Changing positions seems to help with symptoms.  He does not describe claudication or rest pain symptoms.  He has no ulcers about his feet.  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 - Cath: mLAD 20, pRCA 60/70, OM1 99 >> 2.5 x 18 mm Onyx Frontier DES to OM1 // staged PCI 04/03/21: 48 x 3.5 Synergy DES to pRCA; c/b RV branch occlusion and ongoing chest pain, ant STE >> relook cath with patent stents, patent LAD, LM // Echocardiogram 11/22: EF 55-60, no RWMA (difficult windows), Gr 1 DD, normal RVSF // Limited echocardiogram after staged PCI: normal EF and   Drug eruption 04/10/2021   Either really do to losartan or metoprolol to tartrate>> rash cleared with discontinuation of both; able to tolerate carvedilol   HLD (hyperlipidemia) 04/04/2021   HTN (hypertension)    Polycythemia    Pre-diabetes 01/04/2017    Past Surgical History:  Procedure Laterality Date   CORONARY ANGIOGRAPHY N/A 04/03/2021   Procedure: CORONARY ANGIOGRAPHY;  Surgeon: Lyn Records, MD;  Location: [facility];  Service: Cardiovascular;  Laterality: N/A;   CORONARY  STENT INTERVENTION N/A 04/01/2021   Procedure: CORONARY STENT INTERVENTION;  Surgeon: Kathleene Hazel, MD;  Location: [facility];  Service: Cardiovascular;  Laterality: N/A;   CORONARY STENT INTERVENTION N/A 04/03/2021   Procedure: CORONARY STENT INTERVENTION;  Surgeon: Lyn Records, MD;  Location: [facility];  Service: Cardiovascular;  Laterality: N/A;   LEFT HEART CATH AND CORONARY ANGIOGRAPHY N/A 04/01/2021   Procedure: LEFT HEART CATH AND CORONARY ANGIOGRAPHY;  Surgeon: Kathleene Hazel, MD;  Location: [facility];  Service: Cardiovascular;  Laterality: N/A;    History reviewed. No pertinent family history.  Social History   Socioeconomic History   Marital status: Married    Spouse name: Not on file   Number of children: Not on file   Years of education: Not on file   Highest education level: Not on file  Occupational History   Not on file  Tobacco Use   Smoking status: Every Day    Current packs/day: 0.50    Average packs/day: 0.5 packs/day for 25.0 years (12.5 ttl pk-yrs)    Types: Cigarettes   Smokeless tobacco: Current  Substance and Sexual Activity   Alcohol use: Not on file   Drug use: Not on file   Sexual activity: Not on file  Other Topics Concern   Not on file  Social History Narrative   Not on file   Social Determinants of Health   Financial Resource Strain: Not on file  Food Insecurity: No Food  Insecurity (02/05/2022)   Hunger Vital Sign    Worried About Running Out of Food in the Last Year: Never true    Ran Out of Food in the Last Year: Never true  Transportation Needs: No Transportation Needs (02/05/2022)   PRAPARE - Administrator, Civil Service (Medical): No    Lack of Transportation (Non-Medical): No  Physical Activity: Not on file  Stress: Not on file  Social Connections: Not on file  Intimate Partner Violence: Not on file    Allergies  Allergen Reactions   Losartan Rash    ???-Drug eruption  noted post hosp>>Losartan, Metoprolol Tartrate, Atorvastatin all DCd w resolution   Metoprolol Tartrate Rash    ???-Drug eruption noted post hosp in 03/2021>>Losartan, Metoprolol Tartrate, Atorvastatin all DCd w resolution    Current Outpatient Medications  Medication Sig Dispense Refill   acetaminophen (TYLENOL) 500 MG tablet Take 1,000 mg by mouth every 6 (six) hours as needed for mild pain, headache or fever.     amLODipine (NORVASC) 10 MG tablet Take 1 tablet (10 mg total) by mouth daily. 90 tablet 3   aspirin EC 81 MG tablet Take 81 mg by mouth daily. Swallow whole.     atorvastatin (LIPITOR) 80 MG tablet Take 1 tablet (80 mg total) by mouth daily. 90 tablet 3   Blood Glucose Monitoring Suppl DEVI 1 each by Does not apply route in the morning, at noon, and at bedtime. May substitute to any manufacturer covered by patient's insurance. 1 each 0   carvedilol (COREG) 6.25 MG tablet Take 1 tablet (6.25 mg total) by mouth 2 (two) times daily. 180 tablet 2   escitalopram (LEXAPRO) 10 MG tablet Take 1 tablet (10 mg total) by mouth daily. Needs office visit for further refills 90 tablet 3   fenofibrate 160 MG tablet Take 1 tablet (160 mg total) by mouth daily. For cholesterol and triglyceride 90 tablet 3   nitroGLYCERIN (NITROSTAT) 0.4 MG SL tablet Place 1 tablet (0.4 mg total) under the tongue every 5 (five) minutes as needed for chest pain. 25 tablet 12   Omega-3 Fatty Acids (FISH OIL PO) Take 1,200 mg by mouth every evening.     omeprazole (PRILOSEC) 20 MG capsule Take 20 mg by mouth daily as needed (indigestion/heartburn.).     sitaGLIPtin (JANUVIA) 100 MG tablet Take 1 tablet (100 mg total) by mouth daily. 90 tablet 3   ticagrelor (BRILINTA) 60 MG TABS tablet Take 1 tablet (60 mg total) by mouth 2 (two) times daily. 180 tablet 3   betamethasone dipropionate 0.05 % cream 2 times daily per day X 1 week. (Patient not taking: Reported on 12/20/2022) 30 g 0   diphenhydrAMINE (BENADRYL ALLERGY) 25 mg  capsule Take 1 capsule (25 mg total) by mouth every 6 (six) hours as needed. (Patient not taking: Reported on 12/20/2022) 30 capsule 0   triamcinolone ointment (KENALOG) 0.5 % Apply 1 Application topically 2 (two) times daily. (Patient not taking: Reported on 12/20/2022) 30 g 0   No current facility-administered medications for this visit.    PHYSICAL EXAM Vitals:   12/20/22 1048  BP: 131/87  Pulse: 62  Resp: 20  Temp: 98 F (36.7 C)  SpO2: 96%  Weight: 249 lb (112.9 kg)  Height: 5\' 11"  (1.803 m)    Middle-age man in no acute distress.   Regular rate and rhythm Unlabored breathing 2+ dorsalis pedis pulses bilaterally   PERTINENT LABORATORY AND RADIOLOGIC DATA  Most recent CBC  Latest Ref Rng & Units 11/18/2022   10:21 AM 07/13/2022    4:31 PM 03/29/2022    8:12 AM  CBC  WBC 3.4 - 10.8 x10E3/uL 7.8  7.6  7.8   Hemoglobin 13.0 - 17.7 g/dL 47.8  29.5  62.1   Hematocrit 37.5 - 51.0 % 44.3  44.9  48.0   Platelets 150 - 450 x10E3/uL 215  238  222      Most recent CMP    Latest Ref Rng & Units 11/18/2022   10:21 AM 07/13/2022    4:31 PM 03/29/2022    8:12 AM  CMP  Glucose 70 - 99 mg/dL 308  657  846   BUN 6 - 24 mg/dL 15  12  12    Creatinine 0.76 - 1.27 mg/dL 9.62  9.52  8.41   Sodium 134 - 144 mmol/L 136  137  137   Potassium 3.5 - 5.2 mmol/L 4.3  3.8  4.4   Chloride 96 - 106 mmol/L 104  101  103   CO2 20 - 29 mmol/L 18  22  18    Calcium 8.7 - 10.2 mg/dL 9.4  9.3  8.9   Total Protein 6.0 - 8.5 g/dL 7.0  7.0  7.2   Total Bilirubin 0.0 - 1.2 mg/dL 0.6  0.5  0.3   Alkaline Phos 44 - 121 IU/L 69  80  112   AST 0 - 40 IU/L 22  25  22    ALT 0 - 44 IU/L 28  28  28      Renal function CrCl cannot be calculated (Patient's most recent lab result is older than the maximum 21 days allowed.).  HB A1C (BAYER DCA - WAIVED) (%)  Date Value  11/18/2022 5.9 (H)    LDL Chol Calc (NIH)  Date Value Ref Range Status  11/18/2022 67 0 - 99 mg/dL Final       +-------+-----------+-----------+------------+------------+  ABI/TBIToday's ABIToday's TBIPrevious ABIPrevious TBI  +-------+-----------+-----------+------------+------------+  Right 1.23       0.99                                 +-------+-----------+-----------+------------+------------+  Left  1.10       0.87                                 +-------+-----------+-----------+------------+------------+    Rande Brunt. Lenell Antu, MD [name] Phone Number: [phone] 12/20/2022 12:22 PM   Total time spent on preparing this encounter including chart review, data review, collecting history, examining the patient, coordinating care for this new patient, 45 minutes.  Portions of this report may have been transcribed using voice recognition software.  Every effort has been made to ensure accuracy; however, inadvertent computerized transcription errors may still be present.

## 2022-12-20 ENCOUNTER — Encounter: Payer: Self-pay | Admitting: Vascular Surgery

## 2022-12-20 ENCOUNTER — Ambulatory Visit: Payer: BC Managed Care – PPO | Admitting: Vascular Surgery

## 2022-12-20 VITALS — BP 131/87 | HR 62 | Temp 98.0°F | Resp 20 | Ht 71.0 in | Wt 249.0 lb

## 2022-12-20 DIAGNOSIS — R202 Paresthesia of skin: Secondary | ICD-10-CM

## 2023-01-04 ENCOUNTER — Encounter: Payer: Self-pay | Admitting: *Deleted

## 2023-02-14 ENCOUNTER — Ambulatory Visit: Payer: BC Managed Care – PPO | Admitting: Family Medicine

## 2023-02-23 ENCOUNTER — Ambulatory Visit: Payer: BC Managed Care – PPO | Admitting: Family Medicine

## 2023-02-23 ENCOUNTER — Encounter: Payer: Self-pay | Admitting: Family Medicine

## 2023-03-03 DIAGNOSIS — Z789 Other specified health status: Secondary | ICD-10-CM | POA: Diagnosis not present

## 2023-03-03 DIAGNOSIS — D225 Melanocytic nevi of trunk: Secondary | ICD-10-CM | POA: Diagnosis not present

## 2023-03-03 DIAGNOSIS — L821 Other seborrheic keratosis: Secondary | ICD-10-CM | POA: Diagnosis not present

## 2023-03-03 DIAGNOSIS — H53451 Other localized visual field defect, right eye: Secondary | ICD-10-CM | POA: Diagnosis not present

## 2023-03-03 DIAGNOSIS — R208 Other disturbances of skin sensation: Secondary | ICD-10-CM | POA: Diagnosis not present

## 2023-03-03 DIAGNOSIS — L814 Other melanin hyperpigmentation: Secondary | ICD-10-CM | POA: Diagnosis not present

## 2023-03-03 DIAGNOSIS — L918 Other hypertrophic disorders of the skin: Secondary | ICD-10-CM | POA: Diagnosis not present

## 2023-03-16 ENCOUNTER — Ambulatory Visit: Payer: BC Managed Care – PPO | Admitting: Family Medicine

## 2023-03-28 ENCOUNTER — Other Ambulatory Visit: Payer: Self-pay | Admitting: Family Medicine

## 2023-04-14 ENCOUNTER — Encounter: Payer: Self-pay | Admitting: Family Medicine

## 2023-04-14 ENCOUNTER — Ambulatory Visit: Payer: BC Managed Care – PPO | Admitting: Family Medicine

## 2023-04-14 VITALS — BP 131/87 | HR 73 | Temp 97.8°F | Ht 71.0 in | Wt 249.6 lb

## 2023-04-14 DIAGNOSIS — I10 Essential (primary) hypertension: Secondary | ICD-10-CM

## 2023-04-14 DIAGNOSIS — E785 Hyperlipidemia, unspecified: Secondary | ICD-10-CM

## 2023-04-14 DIAGNOSIS — E1159 Type 2 diabetes mellitus with other circulatory complications: Secondary | ICD-10-CM | POA: Diagnosis not present

## 2023-04-14 LAB — BAYER DCA HB A1C WAIVED: HB A1C (BAYER DCA - WAIVED): 6.5 % — ABNORMAL HIGH (ref 4.8–5.6)

## 2023-04-14 NOTE — Progress Notes (Signed)
Subjective:  Patient ID: Corey Robinson,  male    DOB: 09/21/1975  Age: 47 y.o.    CC: Medical Management of Chronic Issues   HPI Corey Robinson presents for  follow-up of hypertension. Patient has no history of headache chest pain or shortness of breath or recent cough. Patient also denies symptoms of TIA such as numbness weakness lateralizing. Patient denies side effects from medication. States taking it regularly.  Patient also  in for follow-up of elevated cholesterol. Doing well without complaints on current medication. Denies side effects  including myalgia and arthralgia and nausea. Also in today for liver function testing. Currently no chest pain, shortness of breath or other cardiovascular related symptoms noted.  Follow-up of diabetes. Patient does check blood sugar at home. Readings run between 95 and 115 Patient denies symptoms such as excessive hunger or urinary frequency, excessive hunger, nausea No significant hypoglycemic spells noted. Medications reviewed. Pt reports taking them regularly. Pt. denies complication/adverse reaction today.    History Corey Robinson has a past medical history of CAD (coronary artery disease) (04/03/2021), Drug eruption (04/10/2021), HLD (hyperlipidemia) (04/04/2021), HTN (hypertension), Polycythemia, and Pre-diabetes (01/04/2017).   He has a past surgical history that includes LEFT HEART CATH AND CORONARY ANGIOGRAPHY (N/A, 04/01/2021); CORONARY STENT INTERVENTION (N/A, 04/01/2021); CORONARY STENT INTERVENTION (N/A, 04/03/2021); and CORONARY ANGIOGRAPHY (N/A, 04/03/2021).   His family history is not on file.He reports that he has been smoking cigarettes. He has a 12.5 pack-year smoking history. He uses smokeless tobacco. No history on file for alcohol use and drug use.  Current Outpatient Medications on File Prior to Visit  Medication Sig Dispense Refill   acetaminophen (TYLENOL) 500 MG tablet Take 1,000 mg by mouth every 6 (six) hours as needed  for mild pain, headache or fever.     amLODipine (NORVASC) 10 MG tablet Take 1 tablet (10 mg total) by mouth daily. 90 tablet 3   aspirin EC 81 MG tablet Take 81 mg by mouth daily. Swallow whole.     atorvastatin (LIPITOR) 80 MG tablet Take 1 tablet (80 mg total) by mouth daily. 90 tablet 3   Blood Glucose Monitoring Suppl DEVI 1 each by Does not apply route in the morning, at noon, and at bedtime. May substitute to any manufacturer covered by patient's insurance. 1 each 0   carvedilol (COREG) 6.25 MG tablet Take 1 tablet (6.25 mg total) by mouth 2 (two) times daily. 180 tablet 2   escitalopram (LEXAPRO) 10 MG tablet Take 1 tablet (10 mg total) by mouth daily. Needs office visit for further refills 90 tablet 3   fenofibrate 160 MG tablet TAKE 1 TABLET BY MOUTH ONCE DAILY FOR CHOLESTEROL AND TRIGLYCERIDES 90 tablet 0   nitroGLYCERIN (NITROSTAT) 0.4 MG SL tablet Place 1 tablet (0.4 mg total) under the tongue every 5 (five) minutes as needed for chest pain. 25 tablet 12   Omega-3 Fatty Acids (FISH OIL PO) Take 1,200 mg by mouth every evening.     omeprazole (PRILOSEC) 20 MG capsule Take 20 mg by mouth daily as needed (indigestion/heartburn.).     sitaGLIPtin (JANUVIA) 100 MG tablet Take 1 tablet (100 mg total) by mouth daily. 90 tablet 3   ticagrelor (BRILINTA) 60 MG TABS tablet Take 1 tablet (60 mg total) by mouth 2 (two) times daily. 180 tablet 3   betamethasone dipropionate 0.05 % cream 2 times daily per day X 1 week. (Patient not taking: Reported on 12/20/2022) 30 g 0   diphenhydrAMINE (BENADRYL ALLERGY)  25 mg capsule Take 1 capsule (25 mg total) by mouth every 6 (six) hours as needed. (Patient not taking: Reported on 12/20/2022) 30 capsule 0   triamcinolone ointment (KENALOG) 0.5 % Apply 1 Application topically 2 (two) times daily. (Patient not taking: Reported on 12/20/2022) 30 g 0   No current facility-administered medications on file prior to visit.    ROS Review of Systems  Constitutional:   Negative for fever.  Respiratory:  Negative for shortness of breath.   Cardiovascular:  Negative for chest pain.  Musculoskeletal:  Negative for arthralgias.  Skin:  Negative for rash.    Objective:  BP 131/87   Pulse 73   Temp 97.8 F (36.6 C)   Ht 5\' 11"  (1.803 m)   Wt 249 lb 9.6 oz (113.2 kg)   SpO2 96%   BMI 34.81 kg/m   BP Readings from Last 3 Encounters:  04/14/23 131/87  12/20/22 131/87  11/18/22 115/74    Wt Readings from Last 3 Encounters:  04/14/23 249 lb 9.6 oz (113.2 kg)  12/20/22 249 lb (112.9 kg)  11/18/22 248 lb 6.4 oz (112.7 kg)     Physical Exam Vitals reviewed.  Constitutional:      Appearance: He is well-developed.  HENT:     Head: Normocephalic and atraumatic.     Right Ear: External ear normal.     Left Ear: External ear normal.     Mouth/Throat:     Pharynx: No oropharyngeal exudate or posterior oropharyngeal erythema.  Eyes:     Pupils: Pupils are equal, round, and reactive to light.  Cardiovascular:     Rate and Rhythm: Normal rate and regular rhythm.     Heart sounds: No murmur heard. Pulmonary:     Effort: No respiratory distress.     Breath sounds: Normal breath sounds.  Musculoskeletal:     Cervical back: Normal range of motion and neck supple.  Neurological:     Mental Status: He is alert and oriented to person, place, and time.     Diabetic Foot Exam - Simple   Simple Foot Form Diabetic Foot exam was performed with the following findings: Yes 04/14/2023  2:40 PM  Visual Inspection No deformities, no ulcerations, no other skin breakdown bilaterally: Yes Sensation Testing Intact to touch and monofilament testing bilaterally: Yes Pulse Check Posterior Tibialis and Dorsalis pulse intact bilaterally: Yes Comments     Lab Results  Component Value Date   HGBA1C 5.9 (H) 11/18/2022   HGBA1C 7.2 (H) 07/13/2022   HGBA1C 6.6 (H) 03/29/2022    Assessment & Plan:   Corey Robinson was seen today for medical management of chronic  issues.  Diagnoses and all orders for this visit:  Type 2 diabetes mellitus with other circulatory complication, without long-term current use of insulin (HCC) -     Bayer DCA Hb A1c Waived  Hyperlipidemia LDL goal <70 -     Lipid panel  Primary hypertension -     CBC with Differential/Platelet -     CMP14+EGFR   I am having Corey Robinson maintain his Omega-3 Fatty Acids (FISH OIL PO), aspirin EC, acetaminophen, nitroGLYCERIN, betamethasone dipropionate, diphenhydrAMINE, omeprazole, sitaGLIPtin, ticagrelor, atorvastatin, amLODipine, Blood Glucose Monitoring Suppl, triamcinolone ointment, carvedilol, escitalopram, and fenofibrate.  No orders of the defined types were placed in this encounter.    Follow-up: No follow-ups on file.  Mechele Claude, M.D.

## 2023-04-15 LAB — CBC WITH DIFFERENTIAL/PLATELET
Basophils Absolute: 0.1 10*3/uL (ref 0.0–0.2)
Basos: 1 %
EOS (ABSOLUTE): 0.2 10*3/uL (ref 0.0–0.4)
Eos: 2 %
Hematocrit: 47.8 % (ref 37.5–51.0)
Hemoglobin: 15.6 g/dL (ref 13.0–17.7)
Immature Grans (Abs): 0 10*3/uL (ref 0.0–0.1)
Immature Granulocytes: 0 %
Lymphocytes Absolute: 3.6 10*3/uL — ABNORMAL HIGH (ref 0.7–3.1)
Lymphs: 38 %
MCH: 29.1 pg (ref 26.6–33.0)
MCHC: 32.6 g/dL (ref 31.5–35.7)
MCV: 89 fL (ref 79–97)
Monocytes Absolute: 0.7 10*3/uL (ref 0.1–0.9)
Monocytes: 7 %
Neutrophils Absolute: 4.9 10*3/uL (ref 1.4–7.0)
Neutrophils: 52 %
Platelets: 250 10*3/uL (ref 150–450)
RBC: 5.36 x10E6/uL (ref 4.14–5.80)
RDW: 12.7 % (ref 11.6–15.4)
WBC: 9.4 10*3/uL (ref 3.4–10.8)

## 2023-04-15 LAB — CMP14+EGFR
ALT: 30 [IU]/L (ref 0–44)
AST: 30 [IU]/L (ref 0–40)
Albumin: 4.7 g/dL (ref 4.1–5.1)
Alkaline Phosphatase: 76 [IU]/L (ref 44–121)
BUN/Creatinine Ratio: 13 (ref 9–20)
BUN: 13 mg/dL (ref 6–24)
Bilirubin Total: 0.6 mg/dL (ref 0.0–1.2)
CO2: 21 mmol/L (ref 20–29)
Calcium: 9.2 mg/dL (ref 8.7–10.2)
Chloride: 104 mmol/L (ref 96–106)
Creatinine, Ser: 1.02 mg/dL (ref 0.76–1.27)
Globulin, Total: 2.6 g/dL (ref 1.5–4.5)
Glucose: 105 mg/dL — ABNORMAL HIGH (ref 70–99)
Potassium: 4.1 mmol/L (ref 3.5–5.2)
Sodium: 140 mmol/L (ref 134–144)
Total Protein: 7.3 g/dL (ref 6.0–8.5)
eGFR: 92 mL/min/{1.73_m2} (ref >59)

## 2023-04-15 LAB — LIPID PANEL
Chol/HDL Ratio: 6.4 ratio — ABNORMAL HIGH (ref 0.0–5.0)
Cholesterol, Total: 140 mg/dL (ref 100–199)
HDL: 22 mg/dL — ABNORMAL LOW (ref >39)
LDL Chol Calc (NIH): 87 mg/dL (ref 0–99)
Triglycerides: 180 mg/dL — ABNORMAL HIGH (ref 0–149)
VLDL Cholesterol Cal: 31 mg/dL (ref 5–40)

## 2023-04-15 NOTE — Progress Notes (Signed)
Hello Corey Robinson,  Your lab result is normal and/or stable.Some minor variations that are not significant are commonly marked abnormal, but do not represent any medical problem for you.  Best regards, Mechele Claude, M.D.

## 2023-04-26 ENCOUNTER — Other Ambulatory Visit: Payer: Self-pay | Admitting: Physician Assistant

## 2023-04-29 ENCOUNTER — Other Ambulatory Visit: Payer: Self-pay | Admitting: Family Medicine

## 2023-06-01 ENCOUNTER — Ambulatory Visit: Payer: BC Managed Care – PPO | Attending: Cardiovascular Disease | Admitting: Cardiovascular Disease

## 2023-06-01 ENCOUNTER — Encounter: Payer: Self-pay | Admitting: Cardiovascular Disease

## 2023-06-01 VITALS — BP 120/86 | HR 75 | Ht 72.0 in | Wt 253.8 lb

## 2023-06-01 DIAGNOSIS — E669 Obesity, unspecified: Secondary | ICD-10-CM

## 2023-06-01 DIAGNOSIS — E785 Hyperlipidemia, unspecified: Secondary | ICD-10-CM | POA: Diagnosis not present

## 2023-06-01 DIAGNOSIS — I10 Essential (primary) hypertension: Secondary | ICD-10-CM | POA: Diagnosis not present

## 2023-06-01 DIAGNOSIS — I2510 Atherosclerotic heart disease of native coronary artery without angina pectoris: Secondary | ICD-10-CM

## 2023-06-01 DIAGNOSIS — Z720 Tobacco use: Secondary | ICD-10-CM | POA: Diagnosis not present

## 2023-06-01 NOTE — Assessment & Plan Note (Signed)
 The patient's LDL cholesterol is above goal even on atorvastatin 80 mg and fenofibrate.  I think he would benefit from a PCSK9 inhibitor.  He is willing to use injectable therapy.  Will refer him to the Pharm.D. lipid clinic.

## 2023-06-01 NOTE — Assessment & Plan Note (Signed)
 Blood pressure controlled on amlodipine and carvedilol.  Lifestyle modification discussed.

## 2023-06-01 NOTE — Assessment & Plan Note (Signed)
 Cessation counseling done.  Patient not ready to quit.

## 2023-06-01 NOTE — Assessment & Plan Note (Signed)
 He is out beyond 24 months from his MI.  Will discontinue ticagrelor.  He should stay on lifelong antiplatelet therapy with low-dose aspirin.  The patient is having no angina or functional limitation related to his heart.

## 2023-06-01 NOTE — Patient Instructions (Signed)
 Medication Instructions:  STOP Brilinta  *If you need a refill on your cardiac medications before your next appointment, please call your pharmacy*  Testing/Procedures: Ambulatory referral to lipid clinic for PCSK9i  Follow-Up: At [facility], you and your health needs are our priority.  As part of our continuing mission to provide you with exceptional heart care, we have created designated Provider Care Teams.  These Care Teams include your primary Cardiologist (physician) and Advanced Practice Providers (APPs -  Physician Assistants and Nurse Practitioners) who all work together to provide you with the care you need, when you need it.  Your next appointment:   1 year(s)  Provider:   Ozell Fell, MD

## 2023-06-01 NOTE — Assessment & Plan Note (Signed)
 We discussed diet and lifestyle modification today.

## 2023-06-01 NOTE — Progress Notes (Signed)
 Cardiology Office Note:    Date:  06/01/2023   ID:  Corey Robinson, DOB 08/30/1975, MRN [id number]  PCP:  Zollie Lowers, MD   [facility] Providers Cardiologist:  Ozell Fell, MD Cardiology APP:  Corey Robinson     Referring MD: Zollie Lowers, MD   Chief Complaint  Patient presents with   Coronary Artery Disease    History of Present Illness:    Corey Robinson is a 48 y.o. male with a hx of:  Coronary artery disease  Inf-lat STEMI 11/22 s/p 2.5 x 18 mm DES to OM1 Staged PCI: 3.5 x 48 mm DES to RCA; RV br occluded TTE 04/01/21: EF 55-60, no def WMA, Gr 1 DD, normal RVSF  C/b drug eruption (? Cause >> Losartan  or Metoprolol  Tartrate) Metoprolol  tartrate ? to Carvedilol   Hypertension  Hyperlipidemia  Pre-diabetes  Tobacco abuse  The patient is here alone today.  States that he has cut back on smoking but is still smoking cigarettes.  He is compliant with his medications.  He denies bleeding or bruising problems.  He denies chest pain, chest pressure, or shortness of breath.  No edema, heart palpitations, orthopnea, or PND.  Overall he feels that he is doing well.       Current Medications: Current Meds  Medication Sig   acetaminophen  (TYLENOL ) 500 MG tablet Take 1,000 mg by mouth every 6 (six) hours as needed for mild pain, headache or fever.   amLODipine  (NORVASC ) 10 MG tablet Take 1 tablet (10 mg total) by mouth daily.   aspirin  EC 81 MG tablet Take 81 mg by mouth daily. Swallow whole.   atorvastatin  (LIPITOR ) 80 MG tablet Take 1 tablet (80 mg total) by mouth daily.   Blood Glucose Monitoring Suppl DEVI 1 each by Does not apply route in the morning, at noon, and at bedtime. May substitute to any manufacturer covered by patient's insurance.   carvedilol  (COREG ) 6.25 MG tablet Take 1 tablet (6.25 mg total) by mouth 2 (two) times daily.   diphenhydrAMINE  (BENADRYL  ALLERGY) 25 mg capsule Take 1 capsule (25 mg total) by mouth every 6 (six) hours as  needed.   escitalopram  (LEXAPRO ) 10 MG tablet Take 1 tablet (10 mg total) by mouth daily. Needs office visit for further refills   fenofibrate  160 MG tablet TAKE 1 TABLET BY MOUTH ONCE DAILY FOR CHOLESTEROL AND TRIGLYCERIDES   JANUVIA  100 MG tablet Take 1 tablet by mouth once daily   nitroGLYCERIN  (NITROSTAT ) 0.4 MG SL tablet Place 1 tablet (0.4 mg total) under the tongue every 5 (five) minutes as needed for chest pain.   omeprazole (PRILOSEC) 20 MG capsule Take 20 mg by mouth daily as needed (indigestion/heartburn.).   triamcinolone  ointment (KENALOG ) 0.5 % Apply 1 Application topically 2 (two) times daily.   [DISCONTINUED] betamethasone  dipropionate 0.05 % cream 2 times daily per day X 1 week.   [DISCONTINUED] Omega-3 Fatty Acids (FISH OIL PO) Take 1,200 mg by mouth every evening.   [DISCONTINUED] ticagrelor  (BRILINTA ) 60 MG TABS tablet Take 1 tablet by mouth twice daily     Allergies:   Losartan  and Metoprolol  tartrate   ROS:   Please see the history of present illness.    All other systems reviewed and are negative.  EKGs/Labs/Other Studies Reviewed:    The following studies were reviewed today: Cardiac Studies & Procedures   CARDIAC CATHETERIZATION  CARDIAC CATHETERIZATION 04/03/2021  Narrative   Prox LAD to Mid LAD lesion is 20%  stenosed.   Prox RCA-2 lesion is 70% stenosed.   Prox RCA-1 lesion is 60% stenosed.   Non-stenotic 1st Mrg lesion was previously treated.   Non-stenotic RV Branch lesion.   A stent was successfully placed.   Post intervention, there is a 0% residual stenosis.   Post intervention, there is a 0% residual stenosis.  Findings Coronary Findings Diagnostic  Dominance: Right  Left Anterior Descending Prox LAD to Mid LAD lesion is 20% stenosed.  Left Circumflex Vessel is large.  First Obtuse Marginal Branch Vessel is moderate in size. Non-stenotic 1st Mrg lesion was previously treated.  Right Coronary Artery Vessel is large. Prox RCA-1 lesion  is 60% stenosed. Prox RCA-2 lesion is 70% stenosed.  Right Ventricular Branch Non-stenotic RV Branch lesion.  Intervention  Prox RCA-1 lesion Stent (Also treats lesions: Prox RCA-2) A stent was successfully placed. Post-Intervention Lesion Assessment The intervention was successful. Pre-interventional TIMI flow is 3. Post-intervention TIMI flow is 3. There is a 0% residual stenosis post intervention.  Prox RCA-2 lesion Stent (Also treats lesions: Prox RCA-1) See details in Prox RCA-1 lesion. Post-Intervention Lesion Assessment The intervention was successful. Intentional subintimal strategy was not used. Embolic protection device was not deployed. The guidewire was not threaded through a graft to reach the lesion. Pre-interventional TIMI flow is 3. Post-intervention TIMI flow is 3. No complications occurred at this lesion. Pressure gradient/FFR was not measured. SABRAUltrasound (IVUS) was not performed on the lesion. No optical coherence tomography (OCT) was performed. The acute marginal branch of the right coronary suffered acute occlusion after stent deployment in the right coronary.  Despite wire reentry, flow did not return.  This was associated with ongoing chest pain and unusual ST elevation in anterior leads.  Repeat cath after noting the EKG changes post initial PCI demonstrated that the left coronary system was widely patent.  ST elevation is secondary to acute RV branch occlusion. There is a 0% residual stenosis post intervention.   CARDIAC CATHETERIZATION 04/01/2021  Narrative   Prox RCA-1 lesion is 60% stenosed.   Prox RCA-2 lesion is 70% stenosed.   1st Mrg lesion is 99% stenosed.   Prox LAD to Mid LAD lesion is 20% stenosed.   A drug-eluting stent was successfully placed using a STENT ONYX FRONTIER 2.5X18.   Post intervention, there is a 0% residual stenosis.  Acute inferolateral STEMI secondary to sub-total occlusion of the first obtuse marginal branch. Successful PTCA/DES  x 1 first obtuse marginal branch Mild plaque in the mid LAD Severe proximal RCA stenosis. The RCA is a dominant vessel.  Recommendations: Will admit to the ICU. DAPT with ASA/Brilinta  for one year. Aggrastat  drip for two hours post PCI. Will start high intensity statin and a low dose beta blocker. Echo in the am. He will need staged PCI of the RCA prior to discharge.  Findings Coronary Findings Diagnostic  Dominance: Right  Left Anterior Descending Prox LAD to Mid LAD lesion is 20% stenosed.  Left Circumflex Vessel is large.  First Obtuse Marginal Branch Vessel is moderate in size. 1st Mrg lesion is 99% stenosed.  Right Coronary Artery Vessel is large. Prox RCA-1 lesion is 60% stenosed. Prox RCA-2 lesion is 70% stenosed.  Intervention  1st Mrg lesion Stent CATH VISTA GUIDE 6FR XBLAD3.5 guide catheter was inserted. Lesion crossed with guidewire using a WIRE COUGAR XT STRL 190CM. Pre-stent angioplasty was performed using a BALLN SAPPHIRE 2.0X12. A drug-eluting stent was successfully placed using a STENT ONYX FRONTIER 2.5X18. Stent strut is well  apposed. Post-stent angioplasty was performed using a BALLN SAPPHIRE [employer] [id number]. Post-Intervention Lesion Assessment The intervention was successful. Pre-interventional TIMI flow is 3. Post-intervention TIMI flow is 3. No complications occurred at this lesion. There is a 0% residual stenosis post intervention.    ECHOCARDIOGRAM  ECHOCARDIOGRAM LIMITED 04/03/2021  Narrative ECHOCARDIOGRAM LIMITED REPORT    Patient Name:   ROBERT SPERL Date of Exam: 04/03/2021 Medical Rec #:  [id number]        Height:       71.0 in Accession #:    [id number]       Weight:       221.8 lb Date of Birth:  September 17, 1975         BSA:          2.203 m Patient Age:    45 years         BP:           154/104 mmHg Patient Gender: M                HR:           65 bpm. Exam Location:  Inpatient  Procedure: Limited Echo  Indications:    CAD Native Vessel  i25.10  History:        Patient has prior history of Echocardiogram examinations, most recent 04/01/2021. CAD; Risk Factors:Diabetes.  Sonographer:    Damien Senior RDCS Referring Phys: [id number] Kairee Kozma   Sonographer Comments: Limited to check anterior wall motion for Dr. Claudene on cath table  Conclusion(s)/Recommendation(s): LV function is normal. Walls are not all well visualized, grossly does not appear to have any regional wall motion abnormalities.  Ronal Ross Electronically signed by Ronal Ross Signature Date/Time: 04/03/2021/1:31:45 PM    Final             EKG:   EKG Interpretation Date/Time:  Wednesday June 01 2023 14:47:20 EST Ventricular Rate:  75 PR Interval:  154 QRS Duration:  90 QT Interval:  378 QTC Calculation: 422 R Axis:   16  Text Interpretation: Normal sinus rhythm Normal ECG When compared with ECG of 04-Apr-2021 08:12, Borderline criteria for Inferior infarct are no longer Present Confirmed by Wonda Sharper [phone]) on 06/01/2023 2:50:23 PM    Recent Labs: 04/14/2023: ALT 30; BUN 13; Creatinine, Ser 1.02; Hemoglobin 15.6; Platelets 250; Potassium 4.1; Sodium 140  Recent Lipid Panel    Component Value Date/Time   CHOL 140 04/14/2023 1408   TRIG 180 (H) 04/14/2023 1408   HDL 22 (L) 04/14/2023 1408   CHOLHDL 6.4 (H) 04/14/2023 1408   CHOLHDL 7.9 04/02/2021 0134   VLDL 56 (H) 04/02/2021 0134   LDLCALC 87 04/14/2023 1408     Risk Assessment/Calculations:                Physical Exam:    VS:  BP 120/86   Pulse 75   Ht 6' (1.829 m)   Wt 253 lb 12.8 oz (115.1 kg)   SpO2 97%   BMI 34.42 kg/m     Wt Readings from Last 3 Encounters:  06/01/23 253 lb 12.8 oz (115.1 kg)  04/14/23 249 lb 9.6 oz (113.2 kg)  12/20/22 249 lb (112.9 kg)     GEN:  Well nourished, well developed in no acute distress HEENT: Normal NECK: No JVD; No carotid bruits LYMPHATICS: No lymphadenopathy CARDIAC: RRR, no murmurs, rubs, gallops RESPIRATORY:  Clear  to auscultation without rales, wheezing or rhonchi  ABDOMEN: Soft, non-tender, non-distended MUSCULOSKELETAL:  No edema; No deformity  SKIN: Warm and dry NEUROLOGIC:  Alert and oriented x 3 PSYCHIATRIC:  Normal affect   Assessment & Plan Coronary artery disease involving native coronary artery of native heart without angina pectoris He is out beyond 24 months from his MI.  Will discontinue ticagrelor .  He should stay on lifelong antiplatelet therapy with low-dose aspirin .  The patient is having no angina or functional limitation related to his heart. Primary hypertension Blood pressure controlled on amlodipine  and carvedilol .  Lifestyle modification discussed. Tobacco abuse Cessation counseling done.  Patient not ready to quit. Obesity (BMI 30-39.9) We discussed diet and lifestyle modification today. Hyperlipidemia LDL goal <70 The patient's LDL cholesterol is above goal even on atorvastatin  80 mg and fenofibrate .  I think he would benefit from a PCSK9 inhibitor.  He is willing to use injectable therapy.  Will refer him to the Pharm.D. lipid clinic.            Medication Adjustments/Labs and Tests Ordered: Current medicines are reviewed at length with the patient today.  Concerns regarding medicines are outlined above.  Orders Placed This Encounter  Procedures   AMB Referral to [facility] Pharm-[facility]   EKG 12-Lead   No orders of the defined types were placed in this encounter.   Patient Instructions  Medication Instructions:  STOP Brilinta  *If you need a refill on your cardiac medications before your next appointment, please call your pharmacy*  Testing/Procedures: Ambulatory referral to lipid clinic for PCSK9i  Follow-Up: At [facility], you and your health needs are our priority.  As part of our continuing mission to provide you with exceptional heart care, we have created designated Provider Care Teams.  These Care Teams include your primary Cardiologist  (physician) and Advanced Practice Providers (APPs -  Physician Assistants and Nurse Practitioners) who all work together to provide you with the care you need, when you need it.  Your next appointment:   1 year(s)  Provider:   Ozell Fell, MD            Signed, Ozell Fell, MD  06/01/2023 3:10 PM    [facility]

## 2023-06-29 ENCOUNTER — Other Ambulatory Visit: Payer: Self-pay | Admitting: Family Medicine

## 2023-07-06 ENCOUNTER — Telehealth: Payer: Self-pay | Admitting: Pharmacy Technician

## 2023-07-06 ENCOUNTER — Telehealth: Payer: Self-pay | Admitting: Pharmacist

## 2023-07-06 ENCOUNTER — Ambulatory Visit: Payer: BC Managed Care – PPO | Attending: Cardiology | Admitting: Student

## 2023-07-06 ENCOUNTER — Encounter: Payer: Self-pay | Admitting: Student

## 2023-07-06 ENCOUNTER — Other Ambulatory Visit (HOSPITAL_COMMUNITY): Payer: Self-pay

## 2023-07-06 DIAGNOSIS — E785 Hyperlipidemia, unspecified: Secondary | ICD-10-CM | POA: Diagnosis not present

## 2023-07-06 MED ORDER — REPATHA SURECLICK 140 MG/ML ~~LOC~~ SOAJ: 140.0000 mg | SUBCUTANEOUS | 3 refills | Status: DC

## 2023-07-06 NOTE — Progress Notes (Signed)
Patient ID: Corey Robinson                 DOB: Sep 25, 1975                    MRN: [id number]      HPI: Corey Robinson is a 48 y.o. male patient referred to lipid clinic by Dr.Copper. PMH is significant for CAD, hx of STEMI, hypertension, T2DM, HLD, obesity, tobacco abuse, anxiety.   Patient presented today for lipid clinic. Reports he has been taking Lipitor 80 mg and fenofibrate 160 mg daily and tolerating them without any problem. He was adopted so does not know the family history, he had MI when he was 48 yrs old. He smokes around 15 cig per day not ready to quit yet- enjoys smoking. Discussed importance of quit smoking and made aware that there are medications  that help to quit smoking. Patient will reach out to PCP when he is ready ready to quit . Eats healthy most part but eats out 2-3 times a week - mainly processed food. Willing to cut down on that.  Reviewed options for lowering LDL cholesterol, including ezetimibe, PCSK-9 inhibitors, bempedoic acid and inclisiran.  Discussed mechanisms of action, dosing, side effects and potential decreases in LDL cholesterol.  Also reviewed cost information and potential options for patient assistance.  Current Medications: Lipitor 80 mg daily and Fenofibrate 160 mg daily   Intolerances: none  Risk Factors: premature CAD, hx of STEMI, T2DM LDL goal: <55 mg/dl TG <409 mg/dl   Diet: eats more carbs, willing to cut down, eats out 2-3 times will reduce it to once or twice a month  Drink - 3 cups of water, coffee,tea,- daily soda -once a week  Exercise: treadmill 20-30 min day walks a lot at work  [employer] once week   Family History:  Not available - patient was adopted  Social History:  Alcohol: rare  Smoking: 15 cig per day not ready to quit yet! Labs: Lipid Panel     Component Value Date/Time   CHOL 140 04/14/2023 1408   TRIG 180 (H) 04/14/2023 1408   HDL 22 (L) 04/14/2023 1408   CHOLHDL 6.4 (H) 04/14/2023 1408   CHOLHDL 7.9  04/02/2021 0134   VLDL 56 (H) 04/02/2021 0134   LDLCALC 87 04/14/2023 1408   LABVLDL 31 04/14/2023 1408    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 - Cath: mLAD 20, pRCA 60/70, OM1 99 >> 2.5 x 18 mm Onyx Frontier DES to OM1 // staged PCI 04/03/21: 48 x 3.5 Synergy DES to pRCA; c/b RV branch occlusion and ongoing chest pain, ant STE >> relook cath with patent stents, patent LAD, LM // Echocardiogram 11/22: EF 55-60, no RWMA (difficult windows), Gr 1 DD, normal RVSF // Limited echocardiogram after staged PCI: normal EF and   Drug eruption 04/10/2021   Either really do to losartan or metoprolol to tartrate>> rash cleared with discontinuation of both; able to tolerate carvedilol   HLD (hyperlipidemia) 04/04/2021   HTN (hypertension)    Polycythemia    Pre-diabetes 01/04/2017    Current Outpatient Medications on File Prior to Visit  Medication Sig Dispense Refill   acetaminophen (TYLENOL) 500 MG tablet Take 1,000 mg by mouth every 6 (six) hours as needed for mild pain, headache or fever.     amLODipine (NORVASC) 10 MG tablet Take 1 tablet (10 mg total) by  mouth daily. 90 tablet 3   aspirin EC 81 MG tablet Take 81 mg by mouth daily. Swallow whole.     atorvastatin (LIPITOR) 80 MG tablet Take 1 tablet (80 mg total) by mouth daily. 90 tablet 3   Blood Glucose Monitoring Suppl DEVI 1 each by Does not apply route in the morning, at noon, and at bedtime. May substitute to any manufacturer covered by patient's insurance. 1 each 0   carvedilol (COREG) 6.25 MG tablet Take 1 tablet (6.25 mg total) by mouth 2 (two) times daily. 180 tablet 2   diphenhydrAMINE (BENADRYL ALLERGY) 25 mg capsule Take 1 capsule (25 mg total) by mouth every 6 (six) hours as needed. 30 capsule 0   escitalopram (LEXAPRO) 10 MG tablet Take 1 tablet (10 mg total) by mouth daily. Needs office visit for further refills 90 tablet 3   fenofibrate 160 MG tablet TAKE 1  TABLET BY MOUTH ONCE DAILY FOR CHOLESTEROL AND  TRIGLYCERIDES 90 tablet 0   JANUVIA 100 MG tablet Take 1 tablet by mouth once daily 90 tablet 0   nitroGLYCERIN (NITROSTAT) 0.4 MG SL tablet Place 1 tablet (0.4 mg total) under the tongue every 5 (five) minutes as needed for chest pain. 25 tablet 12   omeprazole (PRILOSEC) 20 MG capsule Take 20 mg by mouth daily as needed (indigestion/heartburn.).     triamcinolone ointment (KENALOG) 0.5 % Apply 1 Application topically 2 (two) times daily. 30 g 0   No current facility-administered medications on file prior to visit.    Allergies  Allergen Reactions   Losartan Rash    ???-Drug eruption noted post hosp>>Losartan, Metoprolol Tartrate, Atorvastatin all DCd w resolution   Metoprolol Tartrate Rash    ???-Drug eruption noted post hosp in 03/2021>>Losartan, Metoprolol Tartrate, Atorvastatin all DCd w resolution    Assessment/Plan:  1. Hyperlipidemia -  Problem  Hyperlipidemia Ldl Goal <55   Current Medications: Lipitor 80 mg daily and Fenofibrate 160 mg daily   Intolerances: none  Risk Factors: premature CAD, hx of STEMI, T2DM LDL goal: <55 mg/dl TG <578 mg/dl  Last lab LDLc 87, TG 469, TC 140 HDL 22     Hyperlipidemia LDL goal <55 Assessment:  LDL goal: < 55 mg/dl last LDLc 87 mg/dl (62/9528) Tolerates high intensity statins well without any side effects  Admits needs improvement on carbohydrate intake  Discussed next potential options (PCSK-9 inhibitors, bempedoic acid and inclisiran); cost, dosing efficacy, side effects  Exercise regularly   Plan: Continue taking current medications (Lipitor 80 mg daily and fenofibrate 160 mg daily) Will apply for PA for PCSK9i; will inform patient upon approval  Lipid lab due in 2-3 months after starting PCSK9i    Thank you,  Carmela Hurt, Pharm.D [facility] [address] [place], [place] [address]  Phone: [phone]; Fax: [phone]

## 2023-07-06 NOTE — Telephone Encounter (Signed)
PCSK9i PA requested

## 2023-07-06 NOTE — Assessment & Plan Note (Signed)
Assessment:  LDL goal: < 55 mg/dl last LDLc 87 mg/dl (21/3086) Tolerates high intensity statins well without any side effects  Admits needs improvement on carbohydrate intake  Discussed next potential options (PCSK-9 inhibitors, bempedoic acid and inclisiran); cost, dosing efficacy, side effects  Exercise regularly   Plan: Continue taking current medications (Lipitor 80 mg daily and fenofibrate 160 mg daily) Will apply for PA for PCSK9i; will inform patient upon approval  Lipid lab due in 2-3 months after starting [name]

## 2023-07-06 NOTE — Assessment & Plan Note (Deleted)
Current Medications: Lipitor 80 mg daily and Fenofibrate 160 mg daily   Intolerances: none  Risk Factors: premature CAD, hx of STEMI, T2DM LDL goal: <55 mg/dl TG <409 mg/dl  Last lab LDLc 87, TG 811, TC 140 HDL 22

## 2023-07-06 NOTE — Telephone Encounter (Signed)
Pharmacy Patient Advocate Encounter  Received notification from [facility] that Prior Authorization for repatha has been APPROVED from 07/06/23 to 07/05/24. Ran test claim, Copay is $15.00. This test claim was processed through [facility]- copay amounts may vary at other pharmacies due to pharmacy/plan contracts, or as the patient moves through the different stages of their insurance plan.   PA #/Case ID/Reference #: [id number]

## 2023-07-06 NOTE — Telephone Encounter (Signed)
Pharmacy Patient Advocate Encounter   Received notification from [facility] Calls Mess[employer] that prior authorization for repatha is required/requested.   Insurance verification completed.   The patient is insured through [facility] .   Per test claim: PA required; PA submitted to above mentioned insurance via CoverMyMeds Key/confirmation #/EOC [id number] Status is pending

## 2023-07-06 NOTE — Addendum Note (Signed)
Addended by: Tylene Fantasia on: 07/06/2023 05:00 PM   Modules accepted: Orders

## 2023-07-06 NOTE — Patient Instructions (Addendum)
Your Results:             Your most recent labs Goal  Total Cholesterol 140  < 200  Triglycerides 180 < 150  HDL (happy/good cholesterol) 22 > 40  LDL (lousy/bad cholesterol 87 < 55   Medication changes: continue taking atorvastatin 80 mg daily and fenofibrate 160 mg daily  We will start the process to get PCSK9i( Repatha or Praluent)  covered by your insurance.  Once the prior authorization is complete, we will call you to let you know and confirm pharmacy information.      Praluent is a cholesterol medication that improved your body's ability to get rid of "bad cholesterol" known as LDL. It can lower your LDL up to 60%. It is an injection that is given under the skin every 2 weeks. The most common side effects of Praluent include runny nose, symptoms of the common cold, rarely flu or flu-like symptoms, back/muscle pain in about 3-4% of the patients, and redness, pain, or bruising at the injection site.    Repatha is a cholesterol medication that improved your body's ability to get rid of "bad cholesterol" known as LDL. It can lower your LDL up to 60%! It is an injection that is given under the skin every 2 weeks. The medication often requires a prior authorization from your insurance company.  The most common side effects of Repatha include runny nose, symptoms of the common cold, rarely flu or flu-like symptoms, back/muscle pain in about 3-4% of the patients, and redness, pain, or bruising at the injection site.   Lab orders: We want to repeat labs after 2-3 months.  We will send you a lab order to remind you once we get closer to that time.

## 2023-07-07 LAB — LIPOPROTEIN A (LPA): Lipoprotein (a): <8.4 nmol/L (ref <75.0)

## 2023-07-13 ENCOUNTER — Encounter: Payer: Self-pay | Admitting: Pharmacist

## 2023-07-14 ENCOUNTER — Other Ambulatory Visit: Payer: Self-pay | Admitting: Physician Assistant

## 2023-07-18 LAB — HM DIABETES EYE EXAM

## 2023-07-25 ENCOUNTER — Other Ambulatory Visit: Payer: Self-pay | Admitting: Physician Assistant

## 2023-07-26 ENCOUNTER — Encounter: Payer: Self-pay | Admitting: Family Medicine

## 2023-07-26 ENCOUNTER — Ambulatory Visit (INDEPENDENT_AMBULATORY_CARE_PROVIDER_SITE_OTHER): Payer: BC Managed Care – PPO | Admitting: Family Medicine

## 2023-07-26 VITALS — BP 122/78 | HR 78 | Temp 97.6°F | Ht 72.0 in | Wt 247.0 lb

## 2023-07-26 DIAGNOSIS — I70219 Atherosclerosis of native arteries of extremities with intermittent claudication, unspecified extremity: Secondary | ICD-10-CM | POA: Diagnosis not present

## 2023-07-26 DIAGNOSIS — I10 Essential (primary) hypertension: Secondary | ICD-10-CM | POA: Diagnosis not present

## 2023-07-26 DIAGNOSIS — E785 Hyperlipidemia, unspecified: Secondary | ICD-10-CM

## 2023-07-26 DIAGNOSIS — E1159 Type 2 diabetes mellitus with other circulatory complications: Secondary | ICD-10-CM

## 2023-07-26 LAB — LIPID PANEL

## 2023-07-26 LAB — BAYER DCA HB A1C WAIVED: HB A1C (BAYER DCA - WAIVED): 6.2 % — ABNORMAL HIGH (ref 4.8–5.6)

## 2023-07-26 MED ORDER — SITAGLIPTIN PHOSPHATE 100 MG PO TABS: 100.0000 mg | ORAL_TABLET | Freq: Every day | ORAL | 0 refills | Status: DC

## 2023-07-26 NOTE — Progress Notes (Signed)
 Subjective:  Patient ID: Corey Robinson, male    DOB: March 20, 1976  Age: 48 y.o. MRN: [id number]  CC: Medical Management of Chronic Issues (No concers)   HPI ODILON CASS presents forFollow-up of diabetes. Patient checks blood sugar at home.   110-120 fasting and 180 20 min.  postprandial Patient denies symptoms such as polyuria, polydipsia, excessive hunger, nausea No significant hypoglycemic spells noted. Medications reviewed. Pt reports taking them regularly without complication/adverse reaction being reported today.    in for follow-up of elevated cholesterol. Doing well without complaints on current medication. Denies side effects of statin including myalgia and arthralgia and nausea. Currently no chest pain, shortness of breath or other cardiovascular related symptoms noted.Cardiology added Repatha to current regimen.   presents for  follow-up of hypertension. Patient has no history of headache chest pain or shortness of breath or recent cough. Patient also denies symptoms of TIA such as focal numbness or weakness. Patient denies side effects from medication. States taking it regularly.    History Mccartney has a past medical history of CAD (coronary artery disease) (04/03/2021), Drug eruption (04/10/2021), HLD (hyperlipidemia) (04/04/2021), HTN (hypertension), Polycythemia, and Pre-diabetes (01/04/2017).   He has a past surgical history that includes LEFT HEART CATH AND CORONARY ANGIOGRAPHY (N/A, 04/01/2021); CORONARY STENT INTERVENTION (N/A, 04/01/2021); CORONARY STENT INTERVENTION (N/A, 04/03/2021); and CORONARY ANGIOGRAPHY (N/A, 04/03/2021).   His family history is not on file.He reports that he has been smoking cigarettes. He has a 12.5 pack-year smoking history. He uses smokeless tobacco. No history on file for alcohol use and drug use.  Current Outpatient Medications on File Prior to Visit  Medication Sig Dispense Refill   acetaminophen (TYLENOL) 500 MG tablet Take 1,000 mg  by mouth every 6 (six) hours as needed for mild pain, headache or fever.     amLODipine (NORVASC) 10 MG tablet Take 1 tablet by mouth once daily 90 tablet 3   aspirin EC 81 MG tablet Take 81 mg by mouth daily. Swallow whole.     atorvastatin (LIPITOR) 80 MG tablet Take 1 tablet by mouth once daily 90 tablet 2   Blood Glucose Monitoring Suppl DEVI 1 each by Does not apply route in the morning, at noon, and at bedtime. May substitute to any manufacturer covered by patient's insurance. 1 each 0   carvedilol (COREG) 6.25 MG tablet Take 1 tablet (6.25 mg total) by mouth 2 (two) times daily. 180 tablet 2   diphenhydrAMINE (BENADRYL ALLERGY) 25 mg capsule Take 1 capsule (25 mg total) by mouth every 6 (six) hours as needed. 30 capsule 0   escitalopram (LEXAPRO) 10 MG tablet Take 1 tablet (10 mg total) by mouth daily. Needs office visit for further refills 90 tablet 3   Evolocumab (REPATHA SURECLICK) 140 MG/ML SOAJ Inject 140 mg into the skin every 14 (fourteen) days. 6 mL 3   fenofibrate 160 MG tablet TAKE 1 TABLET BY MOUTH ONCE DAILY FOR CHOLESTEROL AND  TRIGLYCERIDES 90 tablet 0   nitroGLYCERIN (NITROSTAT) 0.4 MG SL tablet Place 1 tablet (0.4 mg total) under the tongue every 5 (five) minutes as needed for chest pain. 25 tablet 12   omeprazole (PRILOSEC) 20 MG capsule Take 20 mg by mouth daily as needed (indigestion/heartburn.).     triamcinolone ointment (KENALOG) 0.5 % Apply 1 Application topically 2 (two) times daily. 30 g 0   No current facility-administered medications on file prior to visit.    ROS Review of Systems  Constitutional:  Negative for fever.  Respiratory:  Negative for shortness of breath.   Cardiovascular:  Negative for chest pain.  Musculoskeletal:  Negative for arthralgias.  Skin:  Negative for rash.    Objective:  BP 122/78   Pulse 78   Temp 97.6 F (36.4 C)   Ht 6' (1.829 m)   Wt 247 lb (112 kg)   BMI 33.50 kg/m   BP Readings from Last 3 Encounters:  07/26/23  122/78  06/01/23 120/86  04/14/23 131/87    Wt Readings from Last 3 Encounters:  07/26/23 247 lb (112 kg)  06/01/23 253 lb 12.8 oz (115.1 kg)  04/14/23 249 lb 9.6 oz (113.2 kg)     Physical Exam Vitals reviewed.  Constitutional:      Appearance: He is well-developed.  HENT:     Head: Normocephalic and atraumatic.     Right Ear: External ear normal.     Left Ear: External ear normal.     Mouth/Throat:     Pharynx: No oropharyngeal exudate or posterior oropharyngeal erythema.  Eyes:     Pupils: Pupils are equal, round, and reactive to light.  Cardiovascular:     Rate and Rhythm: Normal rate and regular rhythm.     Heart sounds: No murmur heard. Pulmonary:     Effort: No respiratory distress.     Breath sounds: Normal breath sounds.  Musculoskeletal:     Cervical back: Normal range of motion and neck supple.  Neurological:     Mental Status: He is alert and oriented to person, place, and time.       Assessment & Plan:   Shone was seen today for medical management of chronic issues.  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      I have changed Elige Radon T. Shults's Januvia to sitaGLIPtin. I am also having him maintain his aspirin EC, acetaminophen, nitroGLYCERIN, diphenhydrAMINE, omeprazole, Blood Glucose Monitoring Suppl, triamcinolone ointment, carvedilol, escitalopram, fenofibrate, Repatha SureClick, amLODipine, and atorvastatin.  Meds ordered this encounter  Medications   sitaGLIPtin  (JANUVIA) 100 MG tablet    Sig: Take 1 tablet (100 mg total) by mouth daily.    Dispense:  90 tablet    Refill:  0     Follow-up: Return in about 3 months (around 10/26/2023).  Mechele Claude, M.D.

## 2023-07-27 ENCOUNTER — Encounter: Payer: Self-pay | Admitting: Family Medicine

## 2023-07-27 LAB — CMP14+EGFR
ALT: 31 IU/L (ref 0–44)
AST: 29 IU/L (ref 0–40)
Albumin: 4.5 g/dL (ref 4.1–5.1)
Alkaline Phosphatase: 77 IU/L (ref 44–121)
BUN/Creatinine Ratio: 10 (ref 9–20)
BUN: 11 mg/dL (ref 6–24)
Bilirubin Total: 0.3 mg/dL (ref 0.0–1.2)
CO2: 19 mmol/L — ABNORMAL LOW (ref 20–29)
Calcium: 9.1 mg/dL (ref 8.7–10.2)
Chloride: 107 mmol/L — ABNORMAL HIGH (ref 96–106)
Creatinine, Ser: 1.08 mg/dL (ref 0.76–1.27)
Globulin, Total: 2.5 g/dL (ref 1.5–4.5)
Glucose: 115 mg/dL — ABNORMAL HIGH (ref 70–99)
Potassium: 4.4 mmol/L (ref 3.5–5.2)
Sodium: 141 mmol/L (ref 134–144)
Total Protein: 7 g/dL (ref 6.0–8.5)
eGFR: 86 mL/min/{1.73_m2} (ref >59)

## 2023-07-27 LAB — CBC WITH DIFFERENTIAL/PLATELET
Basophils Absolute: 0.1 10*3/uL (ref 0.0–0.2)
Basos: 1 %
EOS (ABSOLUTE): 0.2 10*3/uL (ref 0.0–0.4)
Eos: 3 %
Hematocrit: 44.8 % (ref 37.5–51.0)
Hemoglobin: 15.1 g/dL (ref 13.0–17.7)
Immature Grans (Abs): 0 10*3/uL (ref 0.0–0.1)
Immature Granulocytes: 0 %
Lymphocytes Absolute: 3 10*3/uL (ref 0.7–3.1)
Lymphs: 41 %
MCH: 29.3 pg (ref 26.6–33.0)
MCHC: 33.7 g/dL (ref 31.5–35.7)
MCV: 87 fL (ref 79–97)
Monocytes Absolute: 0.7 10*3/uL (ref 0.1–0.9)
Monocytes: 10 %
Neutrophils Absolute: 3.3 10*3/uL (ref 1.4–7.0)
Neutrophils: 45 %
Platelets: 238 10*3/uL (ref 150–450)
RBC: 5.16 x10E6/uL (ref 4.14–5.80)
RDW: 12.3 % (ref 11.6–15.4)
WBC: 7.3 10*3/uL (ref 3.4–10.8)

## 2023-07-27 LAB — LIPID PANEL
Cholesterol, Total: 74 mg/dL — ABNORMAL LOW (ref 100–199)
HDL: 23 mg/dL — ABNORMAL LOW (ref >39)
LDL CALC COMMENT:: 3.2 ratio (ref 0.0–5.0)
LDL Chol Calc (NIH): 25 mg/dL (ref 0–99)
Triglycerides: 153 mg/dL — ABNORMAL HIGH (ref 0–149)
VLDL Cholesterol Cal: 26 mg/dL (ref 5–40)

## 2023-07-27 NOTE — Progress Notes (Signed)
 Hello B[place],  Your lab result is normal and/or stable.Some minor variations that are not significant are commonly marked abnormal, but do not represent any medical problem for you.  Best regards, Mechele Claude, M.D.

## 2023-08-14 IMAGING — DX DG CHEST 1V PORT
2 series · 2 of 2 positions shown · non-contrast
Comparison: None.

CLINICAL DATA: Chest pain

EXAM:
PORTABLE CHEST 1 VIEW

[chest ap (1 of 2)]
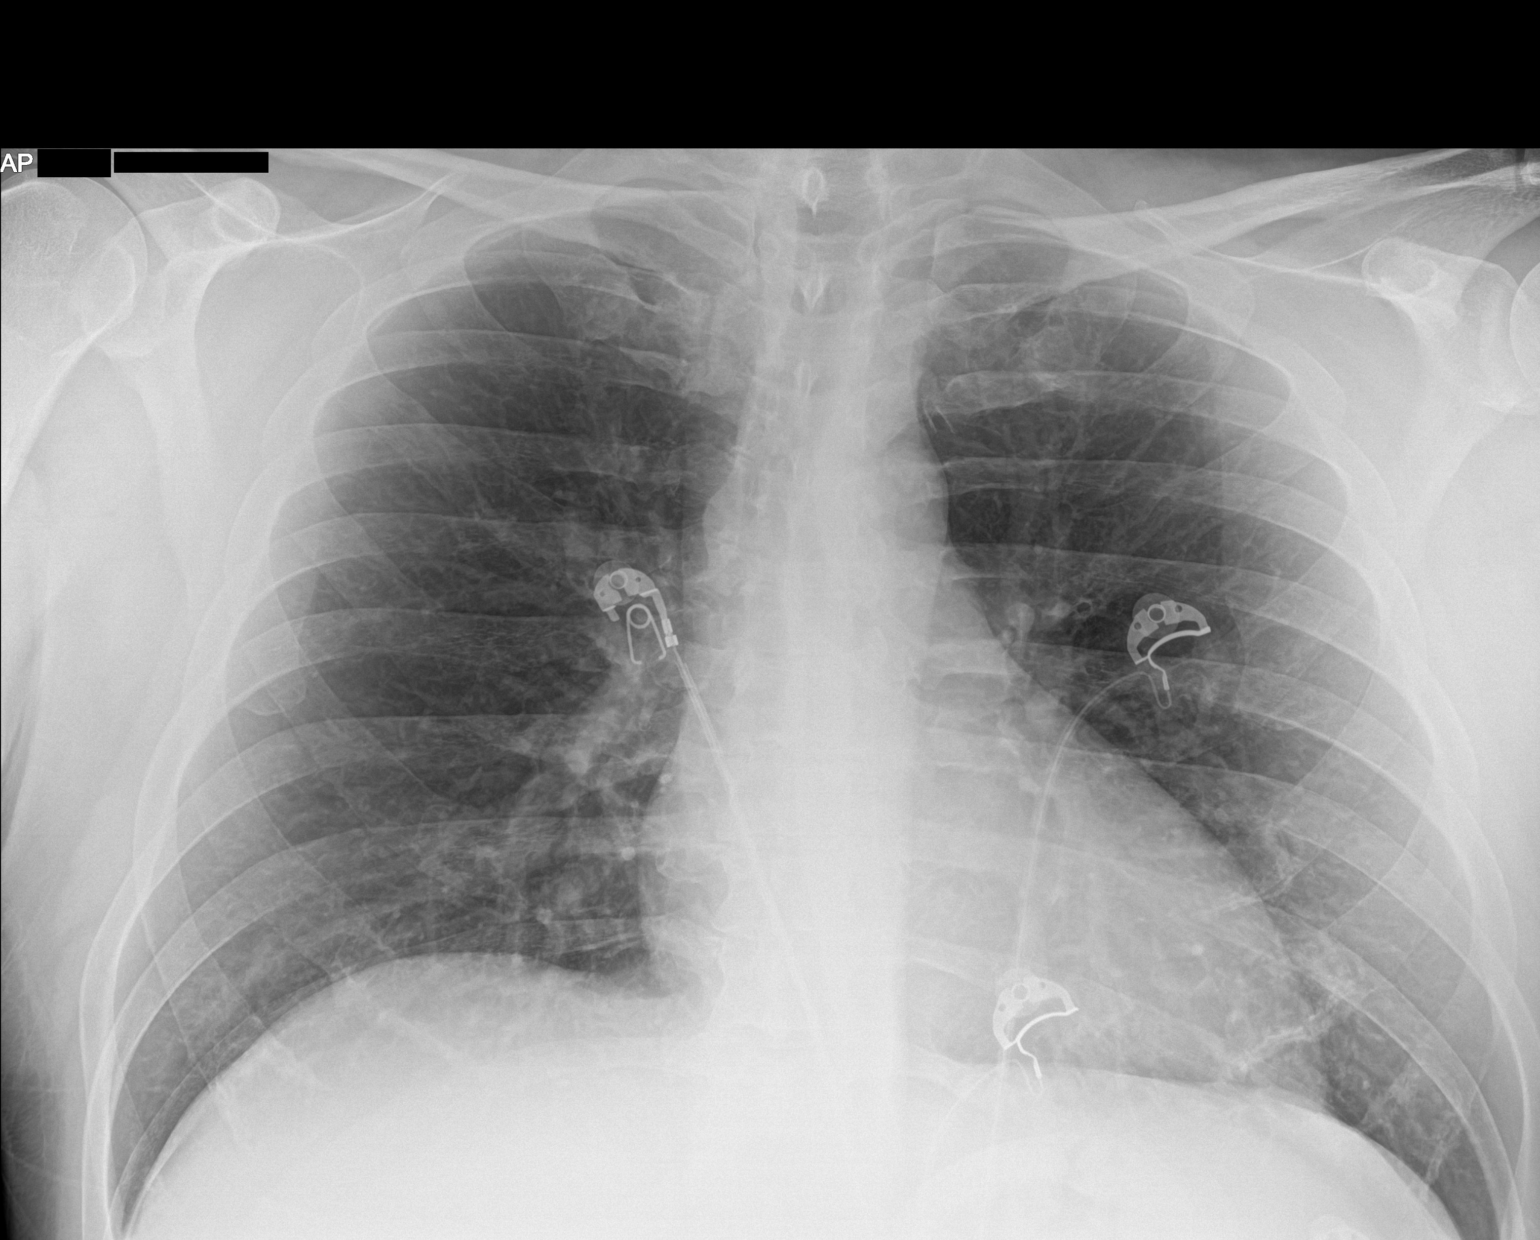

[chest ap (2 of 2)]
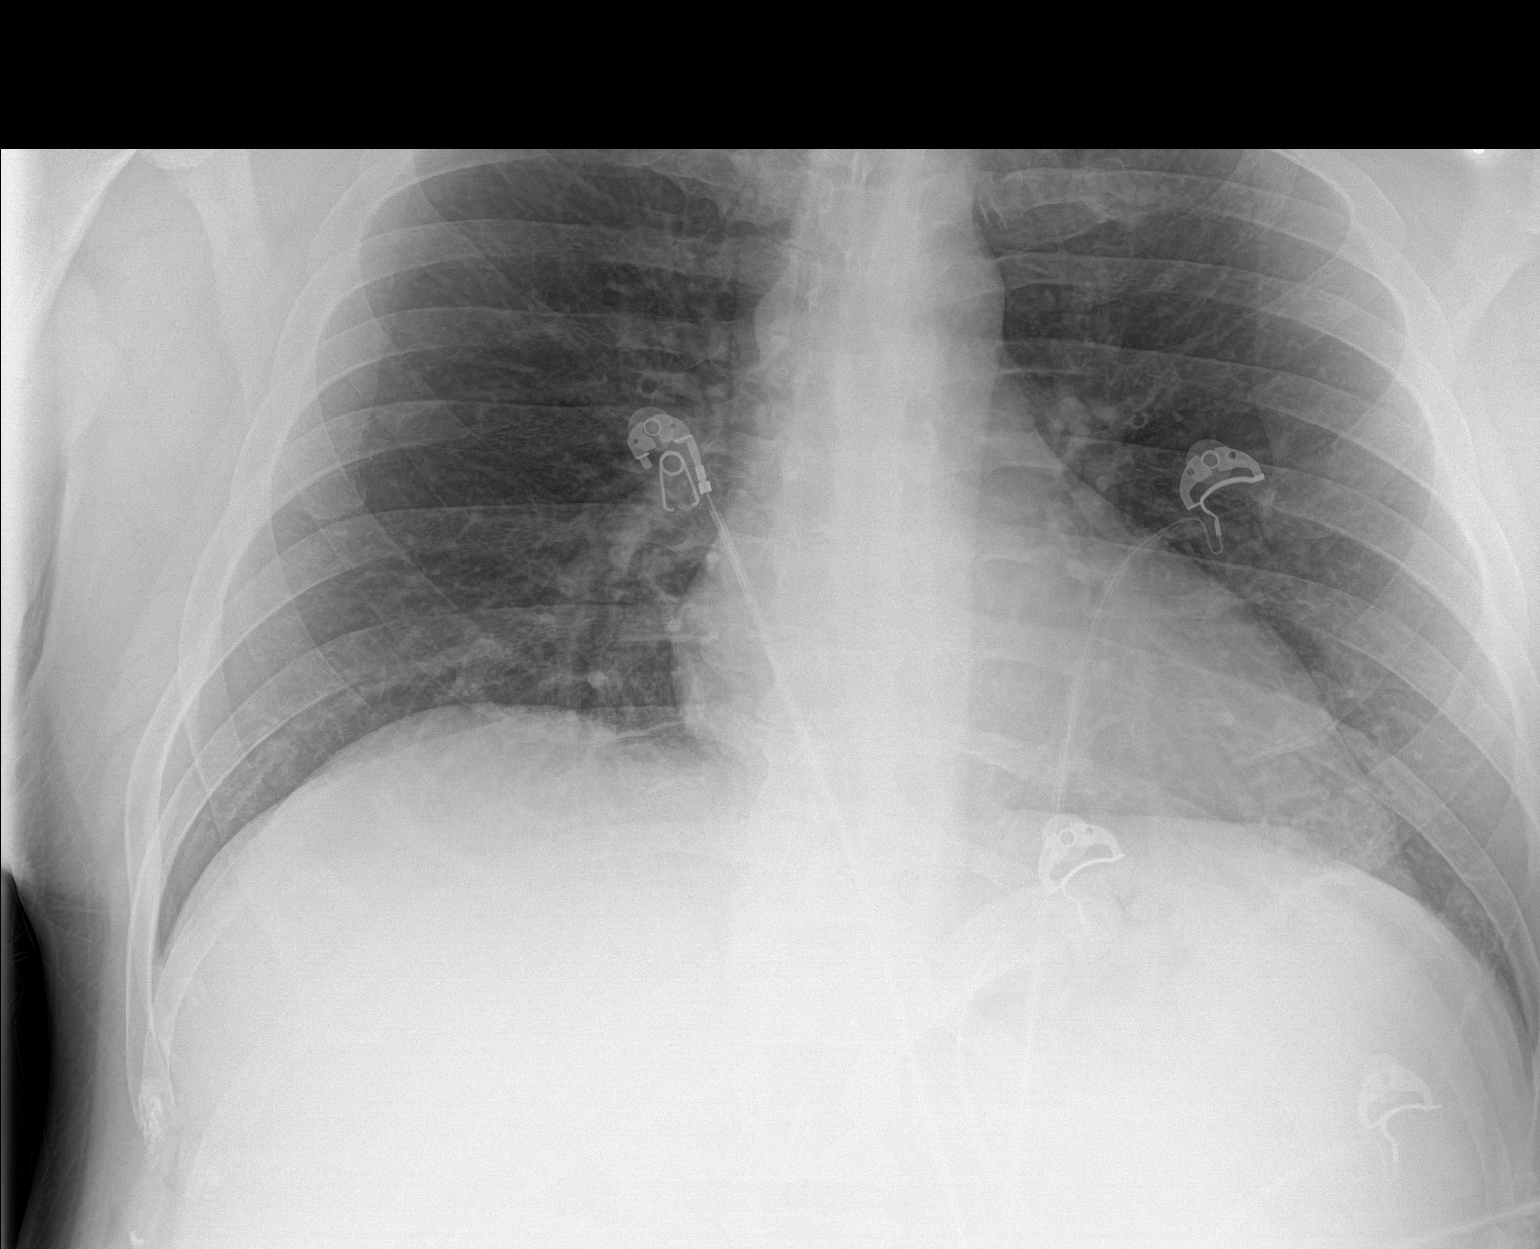

[2 of 2 positions shown; findings below may reference images not displayed]

FINDINGS: The heart size and mediastinal contours are within normal limits.
Both lungs are clear. The visualized skeletal structures are
unremarkable.
IMPRESSION: No active disease.

## 2023-09-26 ENCOUNTER — Other Ambulatory Visit: Payer: Self-pay | Admitting: Family Medicine

## 2023-10-27 ENCOUNTER — Other Ambulatory Visit (HOSPITAL_COMMUNITY): Payer: Self-pay

## 2023-10-27 ENCOUNTER — Telehealth: Payer: Self-pay

## 2023-10-27 ENCOUNTER — Ambulatory Visit: Admitting: Family Medicine

## 2023-10-27 ENCOUNTER — Encounter: Payer: Self-pay | Admitting: Family Medicine

## 2023-10-27 ENCOUNTER — Ambulatory Visit: Payer: Self-pay | Admitting: Family Medicine

## 2023-10-27 VITALS — BP 130/88 | HR 72 | Temp 98.0°F | Ht 72.0 in | Wt 242.0 lb

## 2023-10-27 DIAGNOSIS — I2510 Atherosclerotic heart disease of native coronary artery without angina pectoris: Secondary | ICD-10-CM

## 2023-10-27 DIAGNOSIS — R7309 Other abnormal glucose: Secondary | ICD-10-CM | POA: Diagnosis not present

## 2023-10-27 DIAGNOSIS — F419 Anxiety disorder, unspecified: Secondary | ICD-10-CM

## 2023-10-27 DIAGNOSIS — I10 Essential (primary) hypertension: Secondary | ICD-10-CM

## 2023-10-27 DIAGNOSIS — E1159 Type 2 diabetes mellitus with other circulatory complications: Secondary | ICD-10-CM

## 2023-10-27 DIAGNOSIS — E785 Hyperlipidemia, unspecified: Secondary | ICD-10-CM

## 2023-10-27 DIAGNOSIS — Z6832 Body mass index (BMI) 32.0-32.9, adult: Secondary | ICD-10-CM

## 2023-10-27 DIAGNOSIS — E669 Obesity, unspecified: Secondary | ICD-10-CM

## 2023-10-27 DIAGNOSIS — Z7985 Long-term (current) use of injectable non-insulin antidiabetic drugs: Secondary | ICD-10-CM

## 2023-10-27 DIAGNOSIS — Z720 Tobacco use: Secondary | ICD-10-CM

## 2023-10-27 LAB — BAYER DCA HB A1C WAIVED: HB A1C (BAYER DCA - WAIVED): 6 % — ABNORMAL HIGH (ref 4.8–5.6)

## 2023-10-27 MED ORDER — ESCITALOPRAM OXALATE 10 MG PO TABS: 10.0000 mg | ORAL_TABLET | Freq: Every day | ORAL | 3 refills | Status: AC

## 2023-10-27 MED ORDER — SITAGLIPTIN PHOSPHATE 100 MG PO TABS
100.0000 mg | ORAL_TABLET | Freq: Every day | ORAL | 0 refills | Status: DC
Start: 2023-10-27 — End: 2024-02-01

## 2023-10-27 MED ORDER — OZEMPIC (0.25 OR 0.5 MG/DOSE) 2 MG/3ML ~~LOC~~ SOPN: 0.5000 mg | PEN_INJECTOR | SUBCUTANEOUS | 1 refills | Status: DC

## 2023-10-27 MED ORDER — FENOFIBRATE 160 MG PO TABS: 160.0000 mg | ORAL_TABLET | Freq: Every day | ORAL | 0 refills | Status: DC

## 2023-10-27 NOTE — Progress Notes (Signed)
 Subjective:  Patient ID: Corey Robinson,  male    DOB: 1976/01/03  Age: 48 y.o.    CC: Medical Management of Chronic Issues   HPI Corey Robinson presents for  follow-up of hypertension. Patient has no history of headache chest pain or shortness of breath or recent cough. Patient also denies symptoms of TIA such as numbness weakness lateralizing. Patient denies side effects from medication. States taking it regularly.  Patient also  in for follow-up of elevated cholesterol. Doing well without complaints on current medication. Denies side effects  including myalgia and arthralgia and nausea. Also in today for liver function testing. Currently no chest pain, shortness of breath or other cardiovascular related symptoms noted.  Follow-up of diabetes. Patient does check blood sugar at home. Readings run between 112-120 fasting  and under 180 postprandial. Patient denies symptoms such as excessive hunger or urinary frequency, excessive hunger, nausea No significant hypoglycemic spells noted. Medications reviewed. Pt reports taking them regularly. Pt. denies complication/adverse reaction today.      07/26/2023    8:09 AM 04/14/2023    2:14 PM 03/29/2022    8:04 AM 09/23/2021    8:36 AM  GAD 7 : Generalized Anxiety Score  Nervous, Anxious, on Edge 0 1 1 1   Control/stop worrying 0 0 0 0  Worry too much - different things 0 0 0 0  Trouble relaxing 0 1 1 0  Restless 0 0 0 0  Easily annoyed or irritable 0 1 0 1  Afraid - awful might happen 0 0 0 0  Total GAD 7 Score 0 3 2 2   Anxiety Difficulty Not difficult at all Not difficult at all Not difficult at all Not difficult at all      History Corey Robinson has a past medical history of CAD (coronary artery disease) (04/03/2021), Drug eruption (04/10/2021), HLD (hyperlipidemia) (04/04/2021), HTN (hypertension), Polycythemia, and Pre-diabetes (01/04/2017).   He has a past surgical history that includes LEFT HEART CATH AND CORONARY ANGIOGRAPHY  (N/A, 04/01/2021); CORONARY STENT INTERVENTION (N/A, 04/01/2021); CORONARY STENT INTERVENTION (N/A, 04/03/2021); and CORONARY ANGIOGRAPHY (N/A, 04/03/2021).   His family history is not on file.He reports that he has been smoking cigarettes. He has a 12.5 pack-year smoking history. He uses smokeless tobacco. No history on file for alcohol use and drug use.  Current Outpatient Medications on File Prior to Visit  Medication Sig Dispense Refill   acetaminophen  (TYLENOL ) 500 MG tablet Take 1,000 mg by mouth every 6 (six) hours as needed for mild pain, headache or fever.     amLODipine  (NORVASC ) 10 MG tablet Take 1 tablet by mouth once daily 90 tablet 3   aspirin  EC 81 MG tablet Take 81 mg by mouth daily. Swallow whole.     atorvastatin  (LIPITOR ) 80 MG tablet Take 1 tablet by mouth once daily 90 tablet 2   Blood Glucose Monitoring Suppl DEVI 1 each by Does not apply route in the morning, at noon, and at bedtime. May substitute to any manufacturer covered by patient's insurance. 1 each 0   carvedilol  (COREG ) 6.25 MG tablet Take 1 tablet (6.25 mg total) by mouth 2 (two) times daily. 180 tablet 2   diphenhydrAMINE  (BENADRYL  ALLERGY) 25 mg capsule Take 1 capsule (25 mg total) by mouth every 6 (six) hours as needed. 30 capsule 0   Evolocumab  (REPATHA  SURECLICK) 140 MG/ML SOAJ Inject 140 mg into the skin every 14 (fourteen) days. 6 mL 3   nitroGLYCERIN  (NITROSTAT ) 0.4 MG  SL tablet Place 1 tablet (0.4 mg total) under the tongue every 5 (five) minutes as needed for chest pain. 25 tablet 12   omeprazole (PRILOSEC) 20 MG capsule Take 20 mg by mouth daily as needed (indigestion/heartburn.).     triamcinolone  ointment (KENALOG ) 0.5 % Apply 1 Application topically 2 (two) times daily. 30 g 0   No current facility-administered medications on file prior to visit.    ROS Review of Systems  Constitutional: Negative.  Negative for fever.  HENT: Negative.    Eyes:  Negative for visual disturbance.  Respiratory:   Negative for cough and shortness of breath.   Cardiovascular:  Negative for chest pain and leg swelling.  Gastrointestinal:  Negative for abdominal pain, diarrhea, nausea and vomiting.  Genitourinary:  Negative for difficulty urinating.  Musculoskeletal:  Negative for arthralgias and myalgias.  Skin:  Negative for rash.  Neurological:  Negative for headaches.  Psychiatric/Behavioral:  Negative for sleep disturbance.     Objective:  BP 130/88   Pulse 72   Temp 98 F (36.7 C)   Ht 6' (1.829 m)   Wt 242 lb (109.8 kg)   SpO2 95%   BMI 32.82 kg/m   BP Readings from Last 3 Encounters:  10/27/23 130/88  07/26/23 122/78  06/01/23 120/86    Wt Readings from Last 3 Encounters:  10/27/23 242 lb (109.8 kg)  07/26/23 247 lb (112 kg)  06/01/23 253 lb 12.8 oz (115.1 kg)    Lab Results  Component Value Date   HGBA1C 6.2 (H) 07/26/2023   HGBA1C 6.5 (H) 04/14/2023   HGBA1C 5.9 (H) 11/18/2022    Physical Exam Vitals reviewed.  Constitutional:      Appearance: He is well-developed.  HENT:     Head: Normocephalic and atraumatic.     Right Ear: External ear normal.     Left Ear: External ear normal.     Mouth/Throat:     Pharynx: No oropharyngeal exudate or posterior oropharyngeal erythema.  Eyes:     Pupils: Pupils are equal, round, and reactive to light.  Cardiovascular:     Rate and Rhythm: Normal rate and regular rhythm.     Heart sounds: No murmur heard. Pulmonary:     Effort: No respiratory distress.     Breath sounds: Normal breath sounds.  Musculoskeletal:     Cervical back: Normal range of motion and neck supple.  Neurological:     Mental Status: He is alert and oriented to person, place, and time.         Assessment & Plan:  Type 2 diabetes mellitus with other circulatory complication, without long-term current use of insulin (HCC) -     SITagliptin  Phosphate; Take 1 tablet (100 mg total) by mouth daily.  Dispense: 90 tablet; Refill: 0 -     CBC with  Differential/Platelet -     CMP14+EGFR -     Bayer DCA Hb A1c Waived -     Ozempic (0.25 or 0.5 MG/DOSE); Inject 0.5 mg into the skin once a week.  Dispense: 3 mL; Refill: 1  Coronary artery disease involving native coronary artery of native heart without angina pectoris -     CBC with Differential/Platelet -     CMP14+EGFR  Obesity (BMI 30-39.9) -     CBC with Differential/Platelet -     CMP14+EGFR  Hyperlipidemia LDL goal <55 -     Fenofibrate ; Take 1 tablet (160 mg total) by mouth daily.  Dispense: 90 tablet; Refill: 0 -  CBC with Differential/Platelet -     CMP14+EGFR  Primary hypertension -     CBC with Differential/Platelet -     CMP14+EGFR  Tobacco abuse -     CBC with Differential/Platelet -     CMP14+EGFR  Anxiety -     Escitalopram  Oxalate; Take 1 tablet (10 mg total) by mouth daily. Needs office visit for further refills  Dispense: 90 tablet; Refill: 3  Detailed discussion of weight loss. Pt. Willing to work on this. Willing to try Ozempic both for DM & for weight. Due to his young age for MI, he was encouraged to reduce to ideal weight from 190-205 lb.  Also he still smokes. Still contemplating quitting. Risks reviewed.   Follow-up: Return in about 3 months (around 01/27/2024) for diabetes.  Roise Cleaver, M.D.

## 2023-10-27 NOTE — Telephone Encounter (Signed)
 Pharmacy Patient Advocate Encounter   Received notification from CoverMyMeds that prior authorization for Ozempic 2 is required/requested.   Insurance verification completed.   The patient is insured through [facility] .   Per test claim: PA required; PA submitted to above mentioned insurance via CoverMyMeds Key/confirmation #/EOC [id number] Status is pending

## 2023-10-28 ENCOUNTER — Other Ambulatory Visit: Payer: Self-pay

## 2023-10-28 NOTE — Telephone Encounter (Signed)
 Pt informed via Mychart message. LS

## 2023-10-28 NOTE — Telephone Encounter (Signed)
 Pharmacy Patient Advocate Encounter  Received notification from [facility] that Prior Authorization for Ozempic has been DENIED.  No reason given; No denial letter received via Fax or CMM. It has been requested and will be uploaded to the media tab once received. Once the denial letter is received, if you would like to submit an appeal we can  PA #/Case ID/Reference #: [id number]

## 2023-10-31 LAB — CMP14+EGFR
ALT: 27 IU/L (ref 0–44)
AST: 31 IU/L (ref 0–40)
Albumin: 4.6 g/dL (ref 4.1–5.1)
Alkaline Phosphatase: 75 IU/L (ref 44–121)
BUN/Creatinine Ratio: 14 (ref 9–20)
BUN: 15 mg/dL (ref 6–24)
Bilirubin Total: 0.3 mg/dL (ref 0.0–1.2)
CO2: 15 mmol/L — ABNORMAL LOW (ref 20–29)
Calcium: 9.5 mg/dL (ref 8.7–10.2)
Chloride: 107 mmol/L — ABNORMAL HIGH (ref 96–106)
Creatinine, Ser: 1.05 mg/dL (ref 0.76–1.27)
Globulin, Total: 2.5 g/dL (ref 1.5–4.5)
Glucose: 104 mg/dL — ABNORMAL HIGH (ref 70–99)
Potassium: 4.8 mmol/L (ref 3.5–5.2)
Sodium: 139 mmol/L (ref 134–144)
Total Protein: 7.1 g/dL (ref 6.0–8.5)
eGFR: 89 mL/min/{1.73_m2} (ref >59)

## 2023-10-31 LAB — CBC WITH DIFFERENTIAL/PLATELET
Basophils Absolute: 0.1 10*3/uL (ref 0.0–0.2)
Basos: 1 %
EOS (ABSOLUTE): 0.2 10*3/uL (ref 0.0–0.4)
Eos: 2 %
Hematocrit: 46 % (ref 37.5–51.0)
Hemoglobin: 15.4 g/dL (ref 13.0–17.7)
Immature Grans (Abs): 0 10*3/uL (ref 0.0–0.1)
Immature Granulocytes: 0 %
Lymphocytes Absolute: 3.4 10*3/uL — ABNORMAL HIGH (ref 0.7–3.1)
Lymphs: 40 %
MCH: 30 pg (ref 26.6–33.0)
MCHC: 33.5 g/dL (ref 31.5–35.7)
MCV: 90 fL (ref 79–97)
Monocytes Absolute: 0.9 10*3/uL (ref 0.1–0.9)
Monocytes: 10 %
Neutrophils Absolute: 4.1 10*3/uL (ref 1.4–7.0)
Neutrophils: 47 %
Platelets: 229 10*3/uL (ref 150–450)
RBC: 5.13 x10E6/uL (ref 4.14–5.80)
RDW: 13.2 % (ref 11.6–15.4)
WBC: 8.6 10*3/uL (ref 3.4–10.8)

## 2023-11-03 NOTE — Progress Notes (Signed)
 Hello B[place],  Your lab result is normal and/or stable.Some minor variations that are not significant are commonly marked abnormal, but do not represent any medical problem for you.  Best regards, Mechele Claude, M.D.

## 2023-11-15 ENCOUNTER — Other Ambulatory Visit: Payer: Self-pay | Admitting: Cardiovascular Disease

## 2023-11-21 ENCOUNTER — Encounter: Payer: Self-pay | Admitting: Family Medicine

## 2023-12-08 ENCOUNTER — Other Ambulatory Visit: Payer: Self-pay | Admitting: Family Medicine

## 2023-12-08 DIAGNOSIS — R6882 Decreased libido: Secondary | ICD-10-CM

## 2024-02-01 ENCOUNTER — Ambulatory Visit: Admitting: Family Medicine

## 2024-02-01 ENCOUNTER — Other Ambulatory Visit

## 2024-02-01 ENCOUNTER — Encounter: Payer: Self-pay | Admitting: Family Medicine

## 2024-02-01 VITALS — BP 118/74 | HR 6 | Temp 98.3°F | Ht 72.0 in | Wt 239.0 lb

## 2024-02-01 DIAGNOSIS — Z7984 Long term (current) use of oral hypoglycemic drugs: Secondary | ICD-10-CM | POA: Diagnosis not present

## 2024-02-01 DIAGNOSIS — R6882 Decreased libido: Secondary | ICD-10-CM | POA: Diagnosis not present

## 2024-02-01 DIAGNOSIS — Z125 Encounter for screening for malignant neoplasm of prostate: Secondary | ICD-10-CM | POA: Diagnosis not present

## 2024-02-01 DIAGNOSIS — E669 Obesity, unspecified: Secondary | ICD-10-CM

## 2024-02-01 DIAGNOSIS — R7309 Other abnormal glucose: Secondary | ICD-10-CM | POA: Diagnosis not present

## 2024-02-01 DIAGNOSIS — E1159 Type 2 diabetes mellitus with other circulatory complications: Secondary | ICD-10-CM | POA: Diagnosis not present

## 2024-02-01 LAB — BAYER DCA HB A1C WAIVED: HB A1C (BAYER DCA - WAIVED): 6 % — ABNORMAL HIGH (ref 4.8–5.6)

## 2024-02-01 MED ORDER — TIRZEPATIDE 2.5 MG/0.5ML ~~LOC~~ SOAJ: 2.5000 mg | SUBCUTANEOUS | 5 refills | Status: DC

## 2024-02-01 NOTE — Progress Notes (Signed)
 Subjective:  Patient ID: Corey Robinson, male    DOB: August 16, 1975  Age: 48 y.o. MRN: [id number]  CC: Medical Management of Chronic Issues   HPI  Discussed the use of AI scribe software for clinical note transcription with the patient, who gave verbal consent to proceed.  History of Present Illness Corey Robinson is a 48 year old male with diabetes who presents for a regular diabetes check.  He monitors his blood sugar at home, checking in the mornings and two hours after his largest meal, usually supper. Morning blood sugar levels range between 100 and 115 mg/dL, and evening levels are around 140 mg/dL.  He has a persistent lesion on his foot, which began several months ago following an episode of tinea pedis. The lesion initially improved with Neosporin and a bandage but recurs every few days, with peeling skin and slight itchiness. He uses a 2x2 Band-Aid to cover it.  He is currently taking Januvia  for diabetes management. He also receives Repatha  injections biweekly for cholesterol management and is on Fibrofibrate and Atorvastatin . His insurance did not cover Ozempic .  He underwent an annual physical at work, which included blood draws, EKG, and physical fitness assessments. He participates in annual physicals at work, which include fitness assessments such as push-ups and planks.  No stomach troubles or issues with his feet going numb. He is not using a testosterone  supplement.          02/01/2024    9:24 AM 07/26/2023    8:09 AM 04/14/2023    2:14 PM  Depression screen PHQ 2/9  Decreased Interest 0 0 0  Down, Depressed, Hopeless 0 0 0  PHQ - 2 Score 0 0 0  Altered sleeping 0 0 0  Tired, decreased energy 0 0 0  Change in appetite 0 0 0  Feeling bad or failure about yourself  0 0 0  Trouble concentrating 0 0 0  Moving slowly or fidgety/restless 0 0 0  Suicidal thoughts 0 0 0  PHQ-9 Score 0 0 0  Difficult doing work/chores Not difficult at all Not difficult at all Not  difficult at all    History Corey Robinson has a past medical history of CAD (coronary artery disease) (04/03/2021), Drug eruption (04/10/2021), HLD (hyperlipidemia) (04/04/2021), HTN (hypertension), Polycythemia, and Pre-diabetes (01/04/2017).   He has a past surgical history that includes LEFT HEART CATH AND CORONARY ANGIOGRAPHY (N/A, 04/01/2021); CORONARY STENT INTERVENTION (N/A, 04/01/2021); CORONARY STENT INTERVENTION (N/A, 04/03/2021); and CORONARY ANGIOGRAPHY (N/A, 04/03/2021).   His family history is not on file.He reports that he has been smoking cigarettes. He has a 12.5 pack-year smoking history. He uses smokeless tobacco. No history on file for alcohol use and drug use.    ROS Review of Systems  Constitutional:  Negative for fever.  Respiratory:  Negative for shortness of breath.   Cardiovascular:  Negative for chest pain.  Musculoskeletal:  Negative for arthralgias.  Skin:  Negative for rash.    Objective:  BP 118/74   Pulse (!) 6   Temp 98.3 F (36.8 C)   Ht 6' (1.829 m)   Wt 239 lb (108.4 kg)   SpO2 97%   BMI 32.41 kg/m   BP Readings from Last 3 Encounters:  02/01/24 118/74  10/27/23 130/88  07/26/23 122/78    Wt Readings from Last 3 Encounters:  02/01/24 239 lb (108.4 kg)  10/27/23 242 lb (109.8 kg)  07/26/23 247 lb (112 kg)     Physical Exam Vitals reviewed.  Constitutional:      Appearance: He is well-developed.  HENT:     Head: Normocephalic and atraumatic.     Right Ear: External ear normal.     Left Ear: External ear normal.     Mouth/Throat:     Pharynx: No oropharyngeal exudate or posterior oropharyngeal erythema.  Eyes:     Pupils: Pupils are equal, round, and reactive to light.  Cardiovascular:     Rate and Rhythm: Normal rate and regular rhythm.     Heart sounds: No murmur heard. Pulmonary:     Effort: No respiratory distress.     Breath sounds: Normal breath sounds.  Musculoskeletal:     Cervical back: Normal range of motion and neck  supple.  Neurological:     Mental Status: He is alert and oriented to person, place, and time.      Assessment & Plan:  Type 2 diabetes mellitus with other circulatory complication, without long-term current use of insulin (HCC) -     CMP14+EGFR -     Bayer DCA Hb A1c Waived -     Lipid panel -     Microalbumin / creatinine urine ratio -     Bayer DCA Hb A1c Waived  Low libido -     Testosterone ,Free and Total  Screening PSA (prostate specific antigen) -     PSA, total and free  Obesity (BMI 30-39.9) -     CBC with Differential/Platelet  Other orders -     Tirzepatide ; Inject 2.5 mg into the skin once a week.  Dispense: 2 mL; Refill: 5    Assessment and Plan Assessment & Plan Type 2 diabetes mellitus with chronic kidney disease   Blood glucose levels are well-controlled with morning readings between 100-115 mg/dL and postprandial readings around 140 mg/dL. A1c results are pending. He is currently on Januvia . Insurance declined coverage for Ozempic , so Mounjaro  is being considered as an alternative. Discontinue Januvia  if Mounjaro  is approved and initiated. Continue Januvia  until Mounjaro  is approved. Monitor A1c results when available and work on prior authorization for Mounjaro .  Obesity   Obesity management was discussed in the context of diabetes control. Weight loss is emphasized as a critical component for overall health improvement.  Hyperlipidemia   Currently managed with Repatha  injections every two weeks, atorvastatin , and fenofibrate . Awaiting cholesterol test results to evaluate the effectiveness of the current regimen. Continue Repatha  injections every two weeks, atorvastatin , and fenofibrate . Review cholesterol test results when available.  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 Previous treatment with Neosporin and bandaging provided temporary  relief. The lesion is more of an annoyance, with occasional itching and peeling skin. Prescribe Lotrisone cream (clotrimazole and betamethasone ) to be applied twice daily for up to one month. Advise keeping the bandage on after applying the cream. Monitor for improvement and follow up if the lesion does not respond to treatment.  Vitamin D deficiency   Vitamin D levels are low. Previous supplementation was discontinued by the endocrinologist, but supplementation is recommended to address deficiency. Renew vitamin D supplementation with 50,000 units once weekly. Use existing supply of vitamin D capsules if available.  Hypomagnesemia   Magnesium levels are low, requiring supplementation. Careful monitoring is necessary due to chronic kidney disease. Recommend over-the-counter magnesium oxide supplement, 200 mg once daily.  General Health Maintenance   A colonoscopy is due as part  of routine health maintenance for age. Schedule colonoscopy.       Follow-up: No follow-ups on file.  Butler Der, M.D.

## 2024-02-02 LAB — LIPID PANEL

## 2024-02-02 LAB — MICROALBUMIN / CREATININE URINE RATIO
Creatinine, Urine: 193.2 mg/dL
Microalb/Creat Ratio: 6 mg/g{creat} (ref 0–29)
Microalbumin, Urine: 11.5 ug/mL

## 2024-02-03 ENCOUNTER — Encounter: Payer: Self-pay | Admitting: Family Medicine

## 2024-02-04 ENCOUNTER — Other Ambulatory Visit: Payer: Self-pay | Admitting: Family Medicine

## 2024-02-04 DIAGNOSIS — E1159 Type 2 diabetes mellitus with other circulatory complications: Secondary | ICD-10-CM

## 2024-02-05 ENCOUNTER — Ambulatory Visit: Payer: Self-pay | Admitting: Family Medicine

## 2024-02-07 ENCOUNTER — Telehealth: Payer: Self-pay

## 2024-02-07 ENCOUNTER — Other Ambulatory Visit (HOSPITAL_COMMUNITY): Payer: Self-pay

## 2024-02-07 LAB — CBC WITH DIFFERENTIAL/PLATELET
Basophils Absolute: 0.1 x10E3/uL (ref 0.0–0.2)
Basos: 1 %
EOS (ABSOLUTE): 0.2 x10E3/uL (ref 0.0–0.4)
Eos: 3 %
Hematocrit: 45.1 % (ref 37.5–51.0)
Hemoglobin: 15.2 g/dL (ref 13.0–17.7)
Immature Grans (Abs): 0 x10E3/uL (ref 0.0–0.1)
Immature Granulocytes: 0 %
Lymphocytes Absolute: 3.1 x10E3/uL (ref 0.7–3.1)
Lymphs: 39 %
MCH: 29.9 pg (ref 26.6–33.0)
MCHC: 33.7 g/dL (ref 31.5–35.7)
MCV: 89 fL (ref 79–97)
Monocytes Absolute: 0.7 x10E3/uL (ref 0.1–0.9)
Monocytes: 9 %
Neutrophils Absolute: 3.7 x10E3/uL (ref 1.4–7.0)
Neutrophils: 48 %
Platelets: 226 x10E3/uL (ref 150–450)
RBC: 5.09 x10E6/uL (ref 4.14–5.80)
RDW: 12.9 % (ref 11.6–15.4)
WBC: 7.9 x10E3/uL (ref 3.4–10.8)

## 2024-02-07 LAB — CMP14+EGFR
ALT: 28 IU/L (ref 0–44)
AST: 27 IU/L (ref 0–40)
Albumin: 4.5 g/dL (ref 4.1–5.1)
Alkaline Phosphatase: 77 IU/L (ref 44–121)
BUN/Creatinine Ratio: 13 (ref 9–20)
BUN: 13 mg/dL (ref 6–24)
Bilirubin Total: 0.3 mg/dL (ref 0.0–1.2)
CO2: 20 mmol/L (ref 20–29)
Calcium: 9 mg/dL (ref 8.7–10.2)
Chloride: 106 mmol/L (ref 96–106)
Creatinine, Ser: 1.02 mg/dL (ref 0.76–1.27)
Globulin, Total: 2.4 g/dL (ref 1.5–4.5)
Glucose: 109 mg/dL — ABNORMAL HIGH (ref 70–99)
Potassium: 4.5 mmol/L (ref 3.5–5.2)
Sodium: 139 mmol/L (ref 134–144)
Total Protein: 6.9 g/dL (ref 6.0–8.5)
eGFR: 91 mL/min/1.73 (ref >59)

## 2024-02-07 LAB — LIPID PANEL
Chol/HDL Ratio: 2 ratio (ref 0.0–5.0)
Cholesterol, Total: 43 mg/dL — ABNORMAL LOW (ref 100–199)
HDL: 22 mg/dL — ABNORMAL LOW (ref >39)
LDL Chol Calc (NIH): 1 mg/dL (ref 0–99)
Triglycerides: 106 mg/dL (ref 0–149)
VLDL Cholesterol Cal: 20 mg/dL (ref 5–40)

## 2024-02-07 LAB — PSA, TOTAL AND FREE
PSA, Free Pct: 18.9 %
PSA, Free: 0.17 ng/mL
Prostate Specific Ag, Serum: 0.9 ng/mL (ref 0.0–4.0)

## 2024-02-07 LAB — TESTOSTERONE,FREE AND TOTAL
Testosterone, Free: 5.6 pg/mL — ABNORMAL LOW (ref 6.8–21.5)
Testosterone: 695 ng/dL (ref 264–916)

## 2024-02-07 NOTE — Telephone Encounter (Signed)
 Pharmacy Patient Advocate Encounter   Received notification from [employer] that prior authorization for Mounjaro  2.5MG /0.5ML auto-injectors  is required/requested.   Insurance verification completed.   The patient is insured through [facility] .   Per test claim: The chart notes states that he is currently taking Januvia . I have spoken with our pharmacist and she said that you shouldn't be taking Januvia  and Mounjaro  together. Which one will he be staying on?

## 2024-02-09 NOTE — Progress Notes (Signed)
 Hello B[place],  Your lab result is normal and/or stable.Some minor variations that are not significant are commonly marked abnormal, but do not represent any medical problem for you.  Best regards, Mechele Claude, M.D.

## 2024-02-15 NOTE — Telephone Encounter (Signed)
 Pharmacy Patient Advocate Encounter   Received notification from [employer] that prior authorization for Mounjaro  2.5MG /0.5ML auto-injectors  is required/requested.   Insurance verification completed.   The patient is insured through [name] .   Per test claim: PA required; PA submitted to above mentioned insurance via Latent Key/confirmation #/EOC [id number] Status is pending

## 2024-02-15 NOTE — Telephone Encounter (Signed)
 Can I please get an update on this?

## 2024-02-15 NOTE — Telephone Encounter (Signed)
 When he starts Mounjaro  he is to stop Januvia 

## 2024-02-16 NOTE — Telephone Encounter (Signed)
 Pt aware and verbalized understanding.

## 2024-02-16 NOTE — Telephone Encounter (Signed)
 Pharmacy Patient Advocate Encounter  Received notification from [facility] that Prior Authorization for Mounjaro  2.5MG /0.5ML auto-injectors has been APPROVED from 02/15/24 to 02/14/25   PA #/Case ID/Reference #: [id number]

## 2024-02-17 ENCOUNTER — Other Ambulatory Visit (HOSPITAL_COMMUNITY): Payer: Self-pay

## 2024-03-13 ENCOUNTER — Other Ambulatory Visit (HOSPITAL_COMMUNITY): Payer: Self-pay

## 2024-03-15 ENCOUNTER — Other Ambulatory Visit (HOSPITAL_COMMUNITY): Payer: Self-pay

## 2024-03-20 ENCOUNTER — Other Ambulatory Visit (HOSPITAL_COMMUNITY): Payer: Self-pay

## 2024-03-21 ENCOUNTER — Other Ambulatory Visit (HOSPITAL_COMMUNITY): Payer: Self-pay

## 2024-03-26 ENCOUNTER — Encounter: Payer: Self-pay | Admitting: Family Medicine

## 2024-03-27 ENCOUNTER — Other Ambulatory Visit: Payer: Self-pay

## 2024-03-27 ENCOUNTER — Other Ambulatory Visit (HOSPITAL_COMMUNITY): Payer: Self-pay

## 2024-03-27 MED ORDER — TIRZEPATIDE 5 MG/0.5ML ~~LOC~~ SOAJ: 5.0000 mg | SUBCUTANEOUS | 5 refills | Status: AC

## 2024-03-27 NOTE — Telephone Encounter (Signed)
 Good morning Leah, I called his pharmacy this morning and because he has already filled Mounjaro  2.5mg  his insurance will only cover the same strength twice in 180 days without a PA since the medication is intended to be titrated up.  Is Dr. Zollie planning on keeping him at 2.5 mg as his maintenance dose or is he going to titrate him up to 5 mg.

## 2024-05-05 ENCOUNTER — Other Ambulatory Visit: Payer: Self-pay | Admitting: Family Medicine

## 2024-05-05 ENCOUNTER — Other Ambulatory Visit: Payer: Self-pay | Admitting: Cardiovascular Disease

## 2024-05-05 DIAGNOSIS — E785 Hyperlipidemia, unspecified: Secondary | ICD-10-CM

## 2024-05-07 ENCOUNTER — Encounter: Payer: Self-pay | Admitting: Family Medicine

## 2024-05-07 ENCOUNTER — Ambulatory Visit: Payer: Self-pay | Admitting: Family Medicine

## 2024-05-07 VITALS — BP 120/78 | HR 73 | Temp 97.9°F | Ht 72.0 in | Wt 221.0 lb

## 2024-05-07 DIAGNOSIS — Z720 Tobacco use: Secondary | ICD-10-CM | POA: Diagnosis not present

## 2024-05-07 DIAGNOSIS — Z1211 Encounter for screening for malignant neoplasm of colon: Secondary | ICD-10-CM

## 2024-05-07 DIAGNOSIS — Z7985 Long-term (current) use of injectable non-insulin antidiabetic drugs: Secondary | ICD-10-CM | POA: Diagnosis not present

## 2024-05-07 DIAGNOSIS — E669 Obesity, unspecified: Secondary | ICD-10-CM

## 2024-05-07 DIAGNOSIS — R6882 Decreased libido: Secondary | ICD-10-CM | POA: Diagnosis not present

## 2024-05-07 DIAGNOSIS — I2510 Atherosclerotic heart disease of native coronary artery without angina pectoris: Secondary | ICD-10-CM | POA: Diagnosis not present

## 2024-05-07 DIAGNOSIS — E1159 Type 2 diabetes mellitus with other circulatory complications: Secondary | ICD-10-CM

## 2024-05-07 DIAGNOSIS — E785 Hyperlipidemia, unspecified: Secondary | ICD-10-CM | POA: Diagnosis not present

## 2024-05-07 LAB — BAYER DCA HB A1C WAIVED: HB A1C (BAYER DCA - WAIVED): 5.5 % (ref 4.8–5.6)

## 2024-05-07 MED ORDER — FENOFIBRATE 160 MG PO TABS: 160.0000 mg | ORAL_TABLET | Freq: Every day | ORAL | 0 refills | Status: AC

## 2024-05-07 NOTE — Progress Notes (Signed)
 Subjective:  Patient ID: Corey Robinson, male    DOB: [birth year]  Age: 48 y.o. MRN: [id number]  CC: Follow-up   HPI  Discussed the use of AI scribe software for clinical note transcription with the patient, who gave verbal consent to proceed.  History of Present Illness Corey Robinson is a 48 year old male with diabetes who presents for follow-up on blood sugar management.  He has not checked his blood sugar in approximately three weeks due to a dead monitor battery. Previously, his blood sugar readings were within the normal range, with morning levels between 115 to 130 mg/dL and postprandial levels between 160 to 180 mg/dL. He continues to administer his weekly injections, which suppress his appetite without causing nausea. He has to force himself to eat, experiencing nausea after consuming small amounts, such as half a sandwich.  He has experienced significant weight loss, dropping from 242 pounds in June to 221 pounds, with most of the weight loss occurring in the last three months. Despite not eating, he does not feel hypoglycemic. He mentions that his medication is only active when carbohydrates are present in his stomach, preventing insulin production and subsequent hypoglycemia.  He recalls an initial adverse reaction to the medication, including diarrhea and stomach cramps lasting four to five hours, but these symptoms have not recurred. The current nausea is tolerable, occurring when he feels full after eating, but not severe enough to induce vomiting.  He denies taking testosterone  supplements and reports that his testosterone  levels were in the acceptable range during the last check. He experiences pins and needles in his legs if he stands for 20 to 30 minutes, which resolves with sitting or movement. No issues with swelling.  He has not taken omeprazole for his stomach in about three months and reports no problems with heartburn. His A1c level is 5.5%.   in for follow-up of  elevated cholesterol. Doing well without complaints on current medication. Denies side effects of statin including myalgia and arthralgia and nausea. Currently no chest pain, shortness of breath or other cardiovascular related symptoms noted.  He is referred today for Colonoscopy       02/01/2024    9:24 AM 07/26/2023    8:09 AM 04/14/2023    2:14 PM  Depression screen PHQ 2/9  Decreased Interest 0 0 0  Down, Depressed, Hopeless 0 0 0  PHQ - 2 Score 0 0 0  Altered sleeping 0 0 0  Tired, decreased energy 0 0 0  Change in appetite 0 0 0  Feeling bad or failure about yourself  0 0 0  Trouble concentrating 0 0 0  Moving slowly or fidgety/restless 0 0 0  Suicidal thoughts 0 0 0  PHQ-9 Score 0  0  0   Difficult doing work/chores Not difficult at all Not difficult at all Not difficult at all     Data saved with a previous flowsheet row definition    History Corey Robinson has a past medical history of CAD (coronary artery disease) (04/03/2021), Drug eruption (04/10/2021), HLD (hyperlipidemia) (04/04/2021), HTN (hypertension), Polycythemia, and Pre-diabetes (01/04/2017).   He has a past surgical history that includes LEFT HEART CATH AND CORONARY ANGIOGRAPHY (N/A, 04/01/2021); CORONARY STENT INTERVENTION (N/A, 04/01/2021); CORONARY STENT INTERVENTION (N/A, 04/03/2021); and CORONARY ANGIOGRAPHY (N/A, 04/03/2021).   His family history is not on file.He reports that he has been smoking cigarettes. He has a 12.5 pack-year smoking history. He uses smokeless tobacco. No history on file for alcohol use  and drug use.    ROS Review of Systems  Constitutional: Negative.   HENT: Negative.    Eyes:  Negative for visual disturbance.  Respiratory:  Negative for cough and shortness of breath.   Cardiovascular:  Negative for chest pain and leg swelling.  Gastrointestinal:  Negative for abdominal pain, diarrhea, nausea and vomiting.  Genitourinary:  Negative for difficulty urinating.  Musculoskeletal:   Negative for arthralgias and myalgias.  Skin:  Negative for rash.  Neurological:  Negative for headaches.  Psychiatric/Behavioral:  Negative for sleep disturbance.     Objective:  BP 120/78   Pulse 73   Temp 97.9 F (36.6 C)   Ht 6' (1.829 m)   Wt 221 lb (100.2 kg)   SpO2 98%   BMI 29.97 kg/m   BP Readings from Last 3 Encounters:  05/07/24 120/78  02/01/24 118/74  10/27/23 130/88    Wt Readings from Last 3 Encounters:  05/07/24 221 lb (100.2 kg)  02/01/24 239 lb (108.4 kg)  10/27/23 242 lb (109.8 kg)     Physical Exam Physical Exam MEASUREMENTS: Weight- 221. GENERAL: Alert, cooperative, well developed, no acute distress. HEENT: Normocephalic, normal oropharynx, moist mucous membranes. CHEST: Clear to auscultation bilaterally, no wheezes, rhonchi, or crackles. CARDIOVASCULAR: Normal heart rate and rhythm, S1 and S2 normal without murmurs. ABDOMEN: Soft, non-tender, non-distended, without organomegaly, normal bowel sounds. EXTREMITIES: No cyanosis or edema. NEUROLOGICAL: Awake and alert, cranial nerves grossly intact, moves all extremities without gross motor or sensory deficit, sensation intact.  Diabetic Foot Exam - Simple   Simple Foot Form Diabetic Foot exam was performed with the following findings: Yes 05/07/2024  8:52 AM  Visual Inspection No deformities, no ulcerations, no other skin breakdown bilaterally: Yes Sensation Testing Intact to touch and monofilament testing bilaterally: Yes Pulse Check Posterior Tibialis and Dorsalis pulse intact bilaterally: Yes Comments      Assessment & Plan:  Type 2 diabetes mellitus with other circulatory complication, without long-term current use of insulin (HCC) -     Bayer DCA Hb A1c Waived -     Microalbumin / creatinine urine ratio  Low libido -     Testosterone ,Free and Total  Obesity (BMI 30-39.9) -     Comprehensive metabolic panel with GFR -     Lipid panel  Coronary artery disease involving native  coronary artery of native heart without angina pectoris -     Comprehensive metabolic panel with GFR  Hyperlipidemia LDL goal <55 -     Comprehensive metabolic panel with GFR -     Lipid panel -     Fenofibrate ; Take 1 tablet (160 mg total) by mouth daily.  Dispense: 90 tablet; Refill: 0  Tobacco abuse -     Bayer DCA Hb A1c Waived -     Testosterone ,Free and Total -     Comprehensive metabolic panel with GFR -     Lipid panel -     Microalbumin / creatinine urine ratio  Hyperlipidemia LDL goal <70 -     Comprehensive metabolic panel with GFR -     Lipid panel  Screen for colon cancer -     Ambulatory referral to Gastroenterology    Assessment and Plan Assessment & Plan Type 2 diabetes mellitus with circulatory complications   Type 2 diabetes mellitus is well-controlled with an A1c of 5.5%. Blood glucose levels are stable, with morning readings between 115-130 mg/dL and postprandial readings between 160-180 mg/dL. No hypoglycemic episodes reported. Mounjaro  effectively manages blood  glucose levels without causing hypoglycemia. Mild nausea occurs after eating, especially with high carbohydrate intake, but is tolerable. No significant issues with foot sensation or neuropathy. Continue Mounjaro  as prescribed. Advise on dietary modifications to reduce carbohydrate intake to minimize nausea. Monitor blood glucose levels regularly and check A1c levels periodically.  Obesity   He has lost significant weight from 242 lbs in June to 221 lbs currently, with most weight loss occurring in the last three months. His current weight is approaching a healthy range for his height. Mounjaro  contributes to weight loss by reducing appetite and carbohydrate intake. Continue current weight management strategies. Encourage a high protein, low carbohydrate, and low fat diet to support weight loss and reduce nausea.       Follow-up: Return in about 3 months (around 08/05/2024) for diabetes.  Butler Der, M.D.

## 2024-05-08 LAB — LIPID PANEL
Chol/HDL Ratio: 3.1 ratio (ref 0.0–5.0)
Cholesterol, Total: 87 mg/dL — ABNORMAL LOW (ref 100–199)
HDL: 28 mg/dL — ABNORMAL LOW (ref >39)
LDL Chol Calc (NIH): 26 mg/dL (ref 0–99)
Triglycerides: 209 mg/dL — ABNORMAL HIGH (ref 0–149)
VLDL Cholesterol Cal: 33 mg/dL (ref 5–40)

## 2024-05-08 LAB — COMPREHENSIVE METABOLIC PANEL WITH GFR
ALT: 21 IU/L (ref 0–44)
AST: 23 IU/L (ref 0–40)
Albumin: 4.4 g/dL (ref 4.1–5.1)
Alkaline Phosphatase: 86 IU/L (ref 47–123)
BUN/Creatinine Ratio: 11 (ref 9–20)
BUN: 11 mg/dL (ref 6–24)
Bilirubin Total: 0.3 mg/dL (ref 0.0–1.2)
CO2: 19 mmol/L — ABNORMAL LOW (ref 20–29)
Calcium: 9 mg/dL (ref 8.7–10.2)
Chloride: 105 mmol/L (ref 96–106)
Creatinine, Ser: 0.97 mg/dL (ref 0.76–1.27)
Globulin, Total: 2.2 g/dL (ref 1.5–4.5)
Glucose: 90 mg/dL (ref 70–99)
Potassium: 4.4 mmol/L (ref 3.5–5.2)
Sodium: 140 mmol/L (ref 134–144)
Total Protein: 6.6 g/dL (ref 6.0–8.5)
eGFR: 97 mL/min/1.73 (ref >59)

## 2024-05-08 LAB — TESTOSTERONE,FREE AND TOTAL
Testosterone, Free: 9.2 pg/mL (ref 6.8–21.5)
Testosterone: 676 ng/dL (ref 264–916)

## 2024-05-13 NOTE — Progress Notes (Signed)
 Hello Corey Robinson,  Your lab result is normal and/or stable.Some minor variations that are not significant are commonly marked abnormal, but do not represent any medical problem for you.  Best regards, Mechele Claude, M.D.

## 2024-05-31 ENCOUNTER — Encounter: Payer: Self-pay | Admitting: Cardiovascular Disease

## 2024-06-04 ENCOUNTER — Other Ambulatory Visit: Payer: Self-pay | Admitting: Cardiovascular Disease

## 2024-06-26 ENCOUNTER — Encounter: Payer: Self-pay | Admitting: Gastroenterology

## 2024-08-13 ENCOUNTER — Ambulatory Visit: Admitting: Family Medicine
